# Patient Record
Sex: Female | Born: 1975
Health system: Southern US, Community
[De-identification: ages and names within clinical notes are randomized; demographics above are authoritative.]

## PROBLEM LIST (undated history)

## (undated) DIAGNOSIS — E785 Hyperlipidemia, unspecified: Secondary | ICD-10-CM

## (undated) DIAGNOSIS — M25472 Effusion, left ankle: Secondary | ICD-10-CM

## (undated) DIAGNOSIS — E282 Polycystic ovarian syndrome: Secondary | ICD-10-CM

## (undated) DIAGNOSIS — K219 Gastro-esophageal reflux disease without esophagitis: Secondary | ICD-10-CM

## (undated) DIAGNOSIS — K8050 Calculus of bile duct without cholangitis or cholecystitis without obstruction: Secondary | ICD-10-CM

## (undated) DIAGNOSIS — N390 Urinary tract infection, site not specified: Secondary | ICD-10-CM

## (undated) DIAGNOSIS — F32A Depression, unspecified: Secondary | ICD-10-CM

## (undated) DIAGNOSIS — M25475 Effusion, left foot: Secondary | ICD-10-CM

## (undated) DIAGNOSIS — M25471 Effusion, right ankle: Secondary | ICD-10-CM

## (undated) DIAGNOSIS — M25474 Effusion, right foot: Secondary | ICD-10-CM

## (undated) DIAGNOSIS — K829 Disease of gallbladder, unspecified: Secondary | ICD-10-CM

## (undated) DIAGNOSIS — E119 Type 2 diabetes mellitus without complications: Secondary | ICD-10-CM

## (undated) DIAGNOSIS — R5383 Other fatigue: Secondary | ICD-10-CM

## (undated) DIAGNOSIS — R0602 Shortness of breath: Secondary | ICD-10-CM

## (undated) DIAGNOSIS — F419 Anxiety disorder, unspecified: Secondary | ICD-10-CM

## (undated) DIAGNOSIS — I10 Essential (primary) hypertension: Secondary | ICD-10-CM

## (undated) DIAGNOSIS — K859 Acute pancreatitis without necrosis or infection, unspecified: Secondary | ICD-10-CM

## (undated) HISTORY — DX: Other fatigue: R53.83

## (undated) HISTORY — DX: Effusion, right ankle: M25.471

## (undated) HISTORY — DX: Effusion, right foot: M25.474

## (undated) HISTORY — PX: CHOLECYSTECTOMY: SHX55

## (undated) HISTORY — PX: OVARIAN CYST REMOVAL: SHX89

## (undated) HISTORY — DX: Depression, unspecified: F32.A

## (undated) HISTORY — DX: Urinary tract infection, site not specified: N39.0

## (undated) HISTORY — DX: Effusion, left foot: M25.475

## (undated) HISTORY — DX: Shortness of breath: R06.02

## (undated) HISTORY — DX: Polycystic ovarian syndrome: E28.2

## (undated) HISTORY — DX: Hyperlipidemia, unspecified: E78.5

## (undated) HISTORY — DX: Gastro-esophageal reflux disease without esophagitis: K21.9

## (undated) HISTORY — DX: Acute pancreatitis without necrosis or infection, unspecified: K85.90

## (undated) HISTORY — DX: Disease of gallbladder, unspecified: K82.9

## (undated) HISTORY — DX: Effusion, left ankle: M25.472

## (undated) HISTORY — DX: Calculus of bile duct without cholangitis or cholecystitis without obstruction: K80.50

## (undated) HISTORY — DX: Anxiety disorder, unspecified: F41.9

---

## 2004-01-13 ENCOUNTER — Other Ambulatory Visit: Admission: RE | Admit: 2004-01-13 | Discharge: 2004-01-13 | Payer: Self-pay | Admitting: Family Medicine

## 2005-01-29 ENCOUNTER — Other Ambulatory Visit: Admission: RE | Admit: 2005-01-29 | Discharge: 2005-01-29 | Payer: Self-pay | Admitting: Family Medicine

## 2008-11-03 ENCOUNTER — Emergency Department (HOSPITAL_COMMUNITY): Admission: EM | Admit: 2008-11-03 | Discharge: 2008-11-03 | Payer: Self-pay | Admitting: Emergency Medicine

## 2008-11-08 DIAGNOSIS — N809 Endometriosis, unspecified: Secondary | ICD-10-CM | POA: Insufficient documentation

## 2009-05-28 ENCOUNTER — Emergency Department (HOSPITAL_COMMUNITY): Admission: EM | Admit: 2009-05-28 | Discharge: 2009-05-28 | Payer: Self-pay | Admitting: Family Medicine

## 2009-05-30 ENCOUNTER — Inpatient Hospital Stay (HOSPITAL_COMMUNITY): Admission: EM | Admit: 2009-05-30 | Discharge: 2009-05-31 | Payer: Self-pay | Admitting: Emergency Medicine

## 2009-06-02 ENCOUNTER — Emergency Department (HOSPITAL_COMMUNITY): Admission: EM | Admit: 2009-06-02 | Discharge: 2009-06-02 | Payer: Self-pay | Admitting: Family Medicine

## 2009-06-05 ENCOUNTER — Inpatient Hospital Stay (HOSPITAL_COMMUNITY): Admission: EM | Admit: 2009-06-05 | Discharge: 2009-06-17 | Payer: Self-pay | Admitting: Emergency Medicine

## 2009-08-18 ENCOUNTER — Ambulatory Visit (HOSPITAL_COMMUNITY): Admission: RE | Admit: 2009-08-18 | Discharge: 2009-08-18 | Payer: Self-pay | Admitting: General Surgery

## 2009-08-18 ENCOUNTER — Encounter (INDEPENDENT_AMBULATORY_CARE_PROVIDER_SITE_OTHER): Payer: Self-pay | Admitting: General Surgery

## 2009-08-26 ENCOUNTER — Inpatient Hospital Stay (HOSPITAL_COMMUNITY): Admission: EM | Admit: 2009-08-26 | Discharge: 2009-08-30 | Payer: Self-pay | Admitting: Emergency Medicine

## 2009-09-09 ENCOUNTER — Emergency Department (HOSPITAL_COMMUNITY): Admission: EM | Admit: 2009-09-09 | Discharge: 2009-09-09 | Payer: Self-pay | Admitting: Emergency Medicine

## 2009-11-10 ENCOUNTER — Ambulatory Visit (HOSPITAL_COMMUNITY): Admission: RE | Admit: 2009-11-10 | Discharge: 2009-11-10 | Payer: Self-pay | Admitting: Gastroenterology

## 2011-04-03 LAB — CBC
Platelets: 360 10*3/uL (ref 150–400)
RDW: 14.7 % (ref 11.5–15.5)

## 2011-04-03 LAB — COMPREHENSIVE METABOLIC PANEL
ALT: 13 U/L (ref 0–35)
Albumin: 3.5 g/dL (ref 3.5–5.2)
Alkaline Phosphatase: 59 U/L (ref 39–117)
Calcium: 9 mg/dL (ref 8.4–10.5)
GFR calc Af Amer: >60 mL/min (ref >60)
Potassium: 4.4 mEq/L (ref 3.5–5.1)
Sodium: 138 mEq/L (ref 135–145)
Total Protein: 6.5 g/dL (ref 6.0–8.3)

## 2011-04-03 LAB — DIFFERENTIAL
Basophils Relative: 1 % (ref 0–1)
Eosinophils Absolute: 0.1 10*3/uL (ref 0.0–0.7)
Lymphs Abs: 1.9 10*3/uL (ref 0.7–4.0)
Monocytes Absolute: 0.3 10*3/uL (ref 0.1–1.0)
Monocytes Relative: 3 % (ref 3–12)

## 2011-04-03 LAB — TYPE AND SCREEN

## 2011-04-05 LAB — CBC
HCT: 32.4 % — ABNORMAL LOW (ref 36.0–46.0)
Hemoglobin: 10.7 g/dL — ABNORMAL LOW (ref 12.0–15.0)
MCHC: 33.1 g/dL (ref 30.0–36.0)
MCV: 81.6 fL (ref 78.0–100.0)
Platelets: 350 K/uL (ref 150–400)
RBC: 3.98 MIL/uL (ref 3.87–5.11)
RDW: 15.5 % (ref 11.5–15.5)
WBC: 9.1 K/uL (ref 4.0–10.5)

## 2011-04-05 LAB — LIPASE, BLOOD: Lipase: 19 U/L (ref 11–59)

## 2011-04-05 LAB — COMPREHENSIVE METABOLIC PANEL
ALT: 120 U/L — ABNORMAL HIGH (ref 0–35)
AST: 33 U/L (ref 0–37)
Alkaline Phosphatase: 84 U/L (ref 39–117)
CO2: 26 mEq/L (ref 19–32)
Calcium: 8.9 mg/dL (ref 8.4–10.5)
Chloride: 107 mEq/L (ref 96–112)
GFR calc Af Amer: >60 mL/min (ref >60)
GFR calc non Af Amer: >60 mL/min (ref >60)
Glucose, Bld: 116 mg/dL — ABNORMAL HIGH (ref 70–99)
Sodium: 139 mEq/L (ref 135–145)
Total Bilirubin: 0.5 mg/dL (ref 0.3–1.2)

## 2011-04-06 LAB — URINALYSIS, ROUTINE W REFLEX MICROSCOPIC
Bilirubin Urine: NEGATIVE
Glucose, UA: NEGATIVE mg/dL
Glucose, UA: NEGATIVE mg/dL
Hgb urine dipstick: NEGATIVE
Hgb urine dipstick: NEGATIVE
Nitrite: POSITIVE — AB
Protein, ur: NEGATIVE mg/dL
Specific Gravity, Urine: 1.038 — ABNORMAL HIGH (ref 1.005–1.030)
Urobilinogen, UA: 1 mg/dL (ref 0.0–1.0)
pH: 5.5 (ref 5.0–8.0)

## 2011-04-06 LAB — COMPREHENSIVE METABOLIC PANEL
ALT: 184 U/L — ABNORMAL HIGH (ref 0–35)
ALT: 30 U/L (ref 0–35)
ALT: 340 U/L — ABNORMAL HIGH (ref 0–35)
AST: 236 U/L — ABNORMAL HIGH (ref 0–37)
AST: 24 U/L (ref 0–37)
AST: 38 U/L — ABNORMAL HIGH (ref 0–37)
AST: 85 U/L — ABNORMAL HIGH (ref 0–37)
Albumin: 2.8 g/dL — ABNORMAL LOW (ref 3.5–5.2)
Albumin: 3.5 g/dL (ref 3.5–5.2)
Albumin: 3.7 g/dL (ref 3.5–5.2)
Alkaline Phosphatase: 121 U/L — ABNORMAL HIGH (ref 39–117)
Alkaline Phosphatase: 61 U/L (ref 39–117)
BUN: 4 mg/dL — ABNORMAL LOW (ref 6–23)
CO2: 21 mEq/L (ref 19–32)
Calcium: 8.4 mg/dL (ref 8.4–10.5)
Calcium: 9 mg/dL (ref 8.4–10.5)
Calcium: 9.2 mg/dL (ref 8.4–10.5)
Creatinine, Ser: 0.59 mg/dL (ref 0.4–1.2)
Creatinine, Ser: 0.63 mg/dL (ref 0.4–1.2)
GFR calc Af Amer: >60 mL/min (ref >60)
GFR calc Af Amer: >60 mL/min (ref >60)
GFR calc Af Amer: >60 mL/min (ref >60)
GFR calc Af Amer: >60 mL/min (ref >60)
Glucose, Bld: 97 mg/dL (ref 70–99)
Potassium: 3.8 mEq/L (ref 3.5–5.1)
Potassium: 4 mEq/L (ref 3.5–5.1)
Sodium: 131 mEq/L — ABNORMAL LOW (ref 135–145)
Sodium: 139 mEq/L (ref 135–145)
Sodium: 141 mEq/L (ref 135–145)
Total Protein: 5.5 g/dL — ABNORMAL LOW (ref 6.0–8.3)
Total Protein: 5.7 g/dL — ABNORMAL LOW (ref 6.0–8.3)
Total Protein: 6.6 g/dL (ref 6.0–8.3)

## 2011-04-06 LAB — DIFFERENTIAL
Basophils Relative: 0 % (ref 0–1)
Eosinophils Absolute: 0 10*3/uL (ref 0.0–0.7)
Eosinophils Absolute: 0.1 10*3/uL (ref 0.0–0.7)
Eosinophils Relative: 0 % (ref 0–5)
Lymphocytes Relative: 15 % (ref 12–46)
Lymphs Abs: 1.6 10*3/uL (ref 0.7–4.0)
Lymphs Abs: 2.3 10*3/uL (ref 0.7–4.0)
Monocytes Absolute: 0.5 10*3/uL (ref 0.1–1.0)
Monocytes Absolute: 0.5 10*3/uL (ref 0.1–1.0)
Monocytes Relative: 4 % (ref 3–12)
Monocytes Relative: 4 % (ref 3–12)
Neutrophils Relative %: 77 % (ref 43–77)

## 2011-04-06 LAB — CBC
HCT: 30.6 % — ABNORMAL LOW (ref 36.0–46.0)
Hemoglobin: 11.7 g/dL — ABNORMAL LOW (ref 12.0–15.0)
MCHC: 28 g/dL — ABNORMAL LOW (ref 30.0–36.0)
MCHC: 33.6 g/dL (ref 30.0–36.0)
MCV: 79.4 fL (ref 78.0–100.0)
Platelets: 319 10*3/uL (ref 150–400)
Platelets: 438 10*3/uL — ABNORMAL HIGH (ref 150–400)
Platelets: 448 10*3/uL — ABNORMAL HIGH (ref 150–400)
RBC: 4.67 MIL/uL (ref 3.87–5.11)
RDW: 15.2 % (ref 11.5–15.5)
RDW: 15.5 % (ref 11.5–15.5)
WBC: 10.8 10*3/uL — ABNORMAL HIGH (ref 4.0–10.5)

## 2011-04-06 LAB — URINE MICROSCOPIC-ADD ON

## 2011-04-06 LAB — PROTIME-INR
INR: 1.1 (ref 0.00–1.49)
Prothrombin Time: 13.9 seconds (ref 11.6–15.2)

## 2011-04-06 LAB — AMYLASE: Amylase: 228 U/L — ABNORMAL HIGH (ref 27–131)

## 2011-04-06 LAB — LIPASE, BLOOD: Lipase: >2000 U/L — ABNORMAL HIGH (ref 11–59)

## 2011-04-08 LAB — COMPREHENSIVE METABOLIC PANEL
ALT: 44 U/L — ABNORMAL HIGH (ref 0–35)
AST: 33 U/L (ref 0–37)
AST: 19 U/L (ref 0–37)
AST: 20 U/L (ref 0–37)
AST: 45 U/L — ABNORMAL HIGH (ref 0–37)
Albumin: 2.8 g/dL — ABNORMAL LOW (ref 3.5–5.2)
Albumin: 2 g/dL — ABNORMAL LOW (ref 3.5–5.2)
Albumin: 2 g/dL — ABNORMAL LOW (ref 3.5–5.2)
Albumin: 2 g/dL — ABNORMAL LOW (ref 3.5–5.2)
Albumin: 2.2 g/dL — ABNORMAL LOW (ref 3.5–5.2)
Albumin: 2.2 g/dL — ABNORMAL LOW (ref 3.5–5.2)
Albumin: 2.9 g/dL — ABNORMAL LOW (ref 3.5–5.2)
Alkaline Phosphatase: 145 U/L — ABNORMAL HIGH (ref 39–117)
Alkaline Phosphatase: 87 U/L (ref 39–117)
BUN: 12 mg/dL (ref 6–23)
BUN: 12 mg/dL (ref 6–23)
BUN: 15 mg/dL (ref 6–23)
BUN: 5 mg/dL — ABNORMAL LOW (ref 6–23)
BUN: 5 mg/dL — ABNORMAL LOW (ref 6–23)
BUN: 5 mg/dL — ABNORMAL LOW (ref 6–23)
BUN: 9 mg/dL (ref 6–23)
CO2: 27 mEq/L (ref 19–32)
CO2: 23 mEq/L (ref 19–32)
CO2: 26 mEq/L (ref 19–32)
Calcium: 9.1 mg/dL (ref 8.4–10.5)
Calcium: 9.3 mg/dL (ref 8.4–10.5)
Calcium: 8.1 mg/dL — ABNORMAL LOW (ref 8.4–10.5)
Calcium: 8.2 mg/dL — ABNORMAL LOW (ref 8.4–10.5)
Calcium: 8.9 mg/dL (ref 8.4–10.5)
Calcium: 9.3 mg/dL (ref 8.4–10.5)
Chloride: 106 mEq/L (ref 96–112)
Chloride: 102 mEq/L (ref 96–112)
Chloride: 104 mEq/L (ref 96–112)
Chloride: 106 mEq/L (ref 96–112)
Creatinine, Ser: 0.62 mg/dL (ref 0.4–1.2)
Creatinine, Ser: 0.66 mg/dL (ref 0.4–1.2)
Creatinine, Ser: 0.53 mg/dL (ref 0.4–1.2)
Creatinine, Ser: 0.59 mg/dL (ref 0.4–1.2)
Creatinine, Ser: 0.6 mg/dL (ref 0.4–1.2)
Creatinine, Ser: 0.75 mg/dL (ref 0.4–1.2)
Creatinine, Ser: 0.76 mg/dL (ref 0.4–1.2)
GFR calc Af Amer: >60 mL/min (ref >60)
GFR calc Af Amer: >60 mL/min (ref >60)
GFR calc Af Amer: >60 mL/min (ref >60)
GFR calc Af Amer: >60 mL/min (ref >60)
GFR calc Af Amer: >60 mL/min (ref >60)
GFR calc Af Amer: >60 mL/min (ref >60)
GFR calc non Af Amer: >60 mL/min (ref >60)
GFR calc non Af Amer: >60 mL/min (ref >60)
GFR calc non Af Amer: >60 mL/min (ref >60)
GFR calc non Af Amer: >60 mL/min (ref >60)
GFR calc non Af Amer: >60 mL/min (ref >60)
Glucose, Bld: 108 mg/dL — ABNORMAL HIGH (ref 70–99)
Glucose, Bld: 111 mg/dL — ABNORMAL HIGH (ref 70–99)
Glucose, Bld: 172 mg/dL — ABNORMAL HIGH (ref 70–99)
Potassium: 4 mEq/L (ref 3.5–5.1)
Potassium: 4.5 mEq/L (ref 3.5–5.1)
Sodium: 134 mEq/L — ABNORMAL LOW (ref 135–145)
Total Bilirubin: 0.5 mg/dL (ref 0.3–1.2)
Total Bilirubin: 0.4 mg/dL (ref 0.3–1.2)
Total Bilirubin: 0.6 mg/dL (ref 0.3–1.2)
Total Bilirubin: 0.7 mg/dL (ref 0.3–1.2)
Total Bilirubin: 1 mg/dL (ref 0.3–1.2)
Total Bilirubin: 2 mg/dL — ABNORMAL HIGH (ref 0.3–1.2)
Total Protein: 5.9 g/dL — ABNORMAL LOW (ref 6.0–8.3)
Total Protein: 5.4 g/dL — ABNORMAL LOW (ref 6.0–8.3)
Total Protein: 6.1 g/dL (ref 6.0–8.3)
Total Protein: 6.5 g/dL (ref 6.0–8.3)
Total Protein: 7.4 g/dL (ref 6.0–8.3)

## 2011-04-08 LAB — CULTURE, BLOOD (ROUTINE X 2)
Culture: NO GROWTH
Culture: NO GROWTH

## 2011-04-08 LAB — CBC
HCT: 33 % — ABNORMAL LOW (ref 36.0–46.0)
HCT: 28 % — ABNORMAL LOW (ref 36.0–46.0)
HCT: 28.7 % — ABNORMAL LOW (ref 36.0–46.0)
HCT: 30.9 % — ABNORMAL LOW (ref 36.0–46.0)
HCT: 33.4 % — ABNORMAL LOW (ref 36.0–46.0)
HCT: 34.4 % — ABNORMAL LOW (ref 36.0–46.0)
HCT: 38.9 % (ref 36.0–46.0)
HCT: 38.9 % (ref 36.0–46.0)
Hemoglobin: 10.9 g/dL — ABNORMAL LOW (ref 12.0–15.0)
Hemoglobin: 10.4 g/dL — ABNORMAL LOW (ref 12.0–15.0)
Hemoglobin: 10.9 g/dL — ABNORMAL LOW (ref 12.0–15.0)
Hemoglobin: 12.7 g/dL (ref 12.0–15.0)
Hemoglobin: 12.9 g/dL (ref 12.0–15.0)
Hemoglobin: 13.5 g/dL (ref 12.0–15.0)
MCHC: 33.1 g/dL (ref 30.0–36.0)
MCHC: 33.2 g/dL (ref 30.0–36.0)
MCHC: 33.6 g/dL (ref 30.0–36.0)
MCHC: 33.8 g/dL (ref 30.0–36.0)
MCHC: 33.8 g/dL (ref 30.0–36.0)
MCHC: 34 g/dL (ref 30.0–36.0)
MCV: 81.7 fL (ref 78.0–100.0)
MCV: 81.3 fL (ref 78.0–100.0)
MCV: 81.3 fL (ref 78.0–100.0)
MCV: 81.7 fL (ref 78.0–100.0)
MCV: 81.8 fL (ref 78.0–100.0)
MCV: 82.1 fL (ref 78.0–100.0)
MCV: 82.3 fL (ref 78.0–100.0)
MCV: 82.7 fL (ref 78.0–100.0)
Platelets: 313 10*3/uL (ref 150–400)
Platelets: 349 10*3/uL (ref 150–400)
Platelets: 360 10*3/uL (ref 150–400)
Platelets: 382 10*3/uL (ref 150–400)
Platelets: 556 10*3/uL — ABNORMAL HIGH (ref 150–400)
RBC: 4.05 MIL/uL (ref 3.87–5.11)
RBC: 3.73 MIL/uL — ABNORMAL LOW (ref 3.87–5.11)
RBC: 3.97 MIL/uL (ref 3.87–5.11)
RBC: 4.57 MIL/uL (ref 3.87–5.11)
RDW: 14.8 % (ref 11.5–15.5)
RDW: 15.3 % (ref 11.5–15.5)
RDW: 15.6 % — ABNORMAL HIGH (ref 11.5–15.5)
RDW: 16.2 % — ABNORMAL HIGH (ref 11.5–15.5)
WBC: 11.4 10*3/uL — ABNORMAL HIGH (ref 4.0–10.5)
WBC: 12.6 10*3/uL — ABNORMAL HIGH (ref 4.0–10.5)
WBC: 14.4 10*3/uL — ABNORMAL HIGH (ref 4.0–10.5)
WBC: 17.9 10*3/uL — ABNORMAL HIGH (ref 4.0–10.5)
WBC: 20.6 10*3/uL — ABNORMAL HIGH (ref 4.0–10.5)
WBC: 22.6 10*3/uL — ABNORMAL HIGH (ref 4.0–10.5)
WBC: 23.8 10*3/uL — ABNORMAL HIGH (ref 4.0–10.5)

## 2011-04-08 LAB — DIFFERENTIAL
Basophils Absolute: 0.2 10*3/uL — ABNORMAL HIGH (ref 0.0–0.1)
Basophils Relative: 0 % (ref 0–1)
Basophils Relative: 1 % (ref 0–1)
Basophils Relative: 1 % (ref 0–1)
Eosinophils Absolute: 0.4 10*3/uL (ref 0.0–0.7)
Eosinophils Absolute: 0 10*3/uL (ref 0.0–0.7)
Eosinophils Relative: 0 % (ref 0–5)
Lymphocytes Relative: 10 % — ABNORMAL LOW (ref 12–46)
Lymphs Abs: 1.8 10*3/uL (ref 0.7–4.0)
Lymphs Abs: 0.9 10*3/uL (ref 0.7–4.0)
Lymphs Abs: 1.5 10*3/uL (ref 0.7–4.0)
Lymphs Abs: 1.9 10*3/uL (ref 0.7–4.0)
Monocytes Absolute: 0.7 10*3/uL (ref 0.1–1.0)
Monocytes Absolute: 1 10*3/uL (ref 0.1–1.0)
Monocytes Relative: 4 % (ref 3–12)
Monocytes Relative: 5 % (ref 3–12)
Neutro Abs: 12.6 10*3/uL — ABNORMAL HIGH (ref 1.7–7.7)
Neutro Abs: 16.4 10*3/uL — ABNORMAL HIGH (ref 1.7–7.7)
Neutro Abs: 16.4 10*3/uL — ABNORMAL HIGH (ref 1.7–7.7)
Neutrophils Relative %: 73 % (ref 43–77)
Neutrophils Relative %: 87 % — ABNORMAL HIGH (ref 43–77)
Neutrophils Relative %: 89 % — ABNORMAL HIGH (ref 43–77)

## 2011-04-08 LAB — URINALYSIS, ROUTINE W REFLEX MICROSCOPIC
Hgb urine dipstick: NEGATIVE
Ketones, ur: >80 mg/dL — AB
Nitrite: POSITIVE — AB
Protein, ur: 100 mg/dL — AB
Specific Gravity, Urine: 1.025 (ref 1.005–1.030)
Urobilinogen, UA: 4 mg/dL — ABNORMAL HIGH (ref 0.0–1.0)
pH: 6 (ref 5.0–8.0)

## 2011-04-08 LAB — BASIC METABOLIC PANEL
BUN: 5 mg/dL — ABNORMAL LOW (ref 6–23)
CO2: 25 mEq/L (ref 19–32)
CO2: 26 mEq/L (ref 19–32)
CO2: 27 mEq/L (ref 19–32)
Calcium: 8.1 mg/dL — ABNORMAL LOW (ref 8.4–10.5)
Chloride: 104 mEq/L (ref 96–112)
Chloride: 102 mEq/L (ref 96–112)
Chloride: 103 mEq/L (ref 96–112)
Chloride: 104 mEq/L (ref 96–112)
Creatinine, Ser: 0.5 mg/dL (ref 0.4–1.2)
GFR calc Af Amer: >60 mL/min (ref >60)
GFR calc Af Amer: >60 mL/min (ref >60)
Glucose, Bld: 149 mg/dL — ABNORMAL HIGH (ref 70–99)
Potassium: 4 mEq/L (ref 3.5–5.1)
Potassium: 4.3 mEq/L (ref 3.5–5.1)
Potassium: 4.5 mEq/L (ref 3.5–5.1)
Sodium: 139 mEq/L (ref 135–145)
Sodium: 133 mEq/L — ABNORMAL LOW (ref 135–145)
Sodium: 138 mEq/L (ref 135–145)

## 2011-04-08 LAB — URINE CULTURE
Colony Count: NO GROWTH
Culture: NO GROWTH

## 2011-04-08 LAB — HEPATIC FUNCTION PANEL
ALT: 409 U/L — ABNORMAL HIGH (ref 0–35)
AST: 310 U/L — ABNORMAL HIGH (ref 0–37)
Albumin: 2.9 g/dL — ABNORMAL LOW (ref 3.5–5.2)
Alkaline Phosphatase: 146 U/L — ABNORMAL HIGH (ref 39–117)
Bilirubin, Direct: 1.4 mg/dL — ABNORMAL HIGH (ref 0.0–0.3)
Total Bilirubin: 2 mg/dL — ABNORMAL HIGH (ref 0.3–1.2)
Total Protein: 6.7 g/dL (ref 6.0–8.3)

## 2011-04-08 LAB — LIPASE, BLOOD
Lipase: 50 U/L (ref 11–59)
Lipase: 17 U/L (ref 11–59)
Lipase: 28 U/L (ref 11–59)

## 2011-04-08 LAB — AMYLASE
Amylase: 110 U/L (ref 27–131)
Amylase: 23 U/L — ABNORMAL LOW (ref 27–131)

## 2011-04-08 LAB — LIPID PANEL
Cholesterol: 169 mg/dL (ref 0–200)
LDL Cholesterol: 136 mg/dL — ABNORMAL HIGH (ref 0–99)
VLDL: 19 mg/dL (ref 0–40)

## 2011-04-08 LAB — URINE MICROSCOPIC-ADD ON

## 2011-04-08 LAB — PHOSPHORUS: Phosphorus: 2.7 mg/dL (ref 2.3–4.6)

## 2011-04-08 LAB — MAGNESIUM: Magnesium: 2.4 mg/dL (ref 1.5–2.5)

## 2011-04-08 LAB — TSH: TSH: 4.378 u[IU]/mL (ref 0.350–4.500)

## 2011-04-09 LAB — POCT URINALYSIS DIP (DEVICE)
Glucose, UA: NEGATIVE mg/dL
Nitrite: NEGATIVE
pH: 5.5 (ref 5.0–8.0)

## 2011-04-09 LAB — URINE CULTURE: Colony Count: >=100000

## 2011-05-14 NOTE — Discharge Summary (Signed)
Dawn Downs, Dawn Downs             ACCOUNT NO.:  [id number]   MEDICAL RECORD NO.:  [id number]          PATIENT TYPE:  INP   LOCATION:  1410                         FACILITY:  [facility]   PHYSICIAN:  Isidor Holts, M.D.  DATE OF BIRTH:  June 10, 1976   DATE OF ADMISSION:  06/05/2009  DATE OF DISCHARGE:  06/17/2009                               DISCHARGE SUMMARY   ADDENDUM:  For discharge diagnoses, refer to interim summary dictated  June 13, 2009 by Dr. Peggye Pitt.   UPDATED DISCHARGE DIAGNOSES:  1. Acute gallstone pancreatitis, resolved.  2. Cholelithiasis.  3. Morbid obesity.  4. Allergic rash.  5. Weber B-type fracture of distal left fibula.   DISCHARGE MEDICATIONS:  1. Protonix 40 mg p.o. daily.  2. Colace 100 mg p.o. b.i.d.  3. Pancrease 2 capsules p.o. t.i.d. with food.  4. Vicodin (5/325) one p.o. p.r.n. q.4 - 6 hourly. A total of 42 pills      have been dispensed.  5. 1% Hydrocortisone cream, applied topically to affected areas of      skin b.i.d. for 3 days, then stop.   Note:  Oral contraceptive medication, Ciprofloxacin and Phenergan, have  all been discontinued.   For details of admission history, clinical course and consultations,  refer to interim summary of June 13, 2009.  However, for the period from  June 14, 2009 to June 17, 2009, i.e., the date of this dictation, the  following are pertinent:  1. The patient's clinical condition improved steadily.  She no longer      had abdominal pain.  Following discussion with Gastroenterologist,      Dr. Randa Evens, [facility] tube feeding was discontinued on June 14, 2009,      and the patient was commenced on oral liquids, which were then      advanced without any deleterious consequences.  By June 16, 2009,      she was tolerating a regular low-fat diet without any      symptomatology whatsoever.  We were able to discontinue intravenous      fluids and lipase level checked on June 16, 2009, was 3.  The      patient  underwent abdominal CT scan on June 15, 2009, which showed      considerable imaging improvement, much less pleural fluid and basal      atelectasis, improving appearance of the pancreas with less      inflammation and peripancreatic edema, no developing pseudocyst was      evident.  She was been commenced on pancreatic enzyme supplements      on recommendations of gastroenterologist.  Surgeons were      reconsulted on June 16, 2009, to clarify timing of anticipated      laparoscopic cholecystectomy, and input was kindly provided by Dr.      Daphine Deutscher, who has recommended that patient follow up with Dr. Derrell Lolling      in the office in two week's time, as it would be useful to have      most of the retroperitoneal edema resolved prior to surgery.  Decision would be made following her office visit, as to exact      timing of cholecystectomy.  The patient and her family were      agreeable with this plan.   1. Left ankle fracture.  The patient has a recent left ankle fracture,      and was placed in a cast pre-admission, by Dr. Margaretha Sheffield from the      [facility].  Consultation was requested from      this group during the course of this hospitalization and was kindly      provided by Dr. Teryl Lucy, who ordered a left ankle x-ray,      which was done on June 14, 2009 and showed a healing response at      the Weber type B fracture of distal left fibula, with reduced      definition of fracture plane margins.  Dr. Dion Saucier has recommended      physiotherapy and toe touch weight bearing on the left side.  He      has advised the patient to follow up with him in the orthopedic      office, following discharge.   1. Allergic rash.  The patient on June 16, 2009, complained of a      generalized pruritic macular papular rash, which had arisen      overnight, with involvement of the torso and of the lower part of      her extremities.  Clinically, this was consistent with an  allergic      rash, and although the culprit was not immediately clear, it is      possible that this is maybe due to detergent in the bedclothes or      alternatively, her hospital gown.  She was treated with      antihistaminics, two doses of intravenous Solu-Medrol 12 hours      apart and then topical 1% Hydrocortisone cream, and by a.m. of June 17, 2009, the rash was practically resolved.  We have recommended      that she utilize topical treatment for a further 3 days, following      discharge.   DISPOSITION:  The patient was on June 17, 2009 asymptomatic, tolerating  a low-fat diet, devoid of any further acute issues.  She was considered  clinically stable for discharge, and was therefore discharged  accordingly.   DIET:  Low-fat.   ACTIVITY:  As tolerated, otherwise per PT/OT. Recommended to increase  activity slowly.   FOLLOWUP INSTRUCTIONS:  The patient is to follow up with Dr. Dion Saucier,  [facility], on a date to be determined.  She has  been instructed to call for an appointment, and has been supplied with  appropriate information.  In addition, she is to follow up with Dr.  Charlott Rakes, Gastroenterologist, in 1 week, the appropriate  information has been supplied, and she has been instructed to to call  for an appointment.  She is to follow up with Dr. Claud Kelp,  Surgeon, in 2 weeks. Once again, she has been instructed to call for an  appointment.      Isidor Holts, M.D.  Electronically Signed     CO/MEDQ  D:  06/17/2009  T:  06/17/2009  Job:  [id number]   cc:   Eulas Post, MD  Fax: [phone]   Shirley Friar, MD  Fax: [phone]   Angelia Mould. Derrell Lolling, M.D.  [phone]  [address]  [place]  [place] [address]

## 2011-05-14 NOTE — Op Note (Signed)
Dawn Downs, Dawn Downs             ACCOUNT NO.:  [id number]   MEDICAL RECORD NO.:  [id number]          PATIENT TYPE:  AMB   LOCATION:  DAY                          FACILITY:  [facility]   PHYSICIAN:  Angelia Mould. Derrell Lolling, M.D.DATE OF BIRTH:  Oct 12, 1976   DATE OF PROCEDURE:  08/18/2009  DATE OF DISCHARGE:                               OPERATIVE REPORT   PREOPERATIVE DIAGNOSES:  1. Chronic cholecystitis with cholelithiasis.  2. Biliary pancreatitis.   POSTOPERATIVE DIAGNOSES:  1. Chronic cholecystitis with cholelithiasis.  2. Biliary pancreatitis.   OPERATION PERFORMED:  Laparoscopic cholecystectomy with intraoperative  cholangiogram.   SURGEON:  Dr. Claud Kelp.   FIRST ASSISTANT:  Dr. Gaynelle Adu.   OPERATIVE INDICATIONS:  This is a 35 year old Caucasian female who was  admitted to the hospital on June 06, 2009 with gallstone pancreatitis.  CT findings suggested fairly severe edematous pancreatitis and a  calcific density in the head of pancreas.  She remains symptomatic for  several days and is felt to have moderately severe pancreatitis.  Subsequent CTs continued to show edematous pancreatitis, but no evidence  of necrosis or abscess.  On the second CT the calcific density in the  head of the pancreas was gone, and we theorized that she passed a common  duct stone.  We elected to delay her cholecystectomy because of the  severity of her pancreatitis.  She has been followed by me and by Dr.  Charlott Rakes.  She has been taking pancreatic enzyme supplements.  She is asymptomatic.  She is brought to operating room electively for  cholecystectomy.   OPERATIVE FINDINGS:  The gallbladder was thick-walled but small.  There  was no acute inflammation.  The cystic duct was thick-walled but patent.  The cholangiogram showed somewhat dilated biliary tree but there were no  filling defects, the anatomy of the intrahepatic and extrahepatic bile  ducts was normal, and there was no  obstruction with good flow of  contrast into the duodenum.  The liver looked somewhat enlarged and  heavy consistent with mild fatty infiltration.  The stomach, duodenum,  small intestine, large intestine and omentum and peritoneal surfaces  otherwise looked normal.   OPERATIVE TECHNIQUE:  Following induction of general endotracheal  anesthesia, the patient's abdomen was prepped and draped in sterile  fashion.  Intravenous antibiotics were given.  The patient was  identified as correct patient and correct procedure.  0.5% Marcaine with  epinephrine was used as a local infiltration anesthetic.  A vertically  oriented incision was made in the upper rim of the umbilicus.  The  fascia was incised in the midline.  The abdominal cavity entered under  direct vision.  A 10-mm Hasson trocar was inserted and secured with a  pursestring suture of 0-0 Vicryl.  Pneumoperitoneum was created.  Video  cam was inserted with visualization and findings as described above.  A  11-mm trocar was placed in the subxiphoid region and two 5 mm trocars  placed in the right upper quadrant.  The gallbladder fundus was  identified and elevated.  A small tear occurred early spilling some  bile.  A  couple of stones came out of the fundus of the gallbladder and  these were quickly retrieved.  We took adhesions down off the lower body  and infundibulum of the gallbladder.  We could identify the cystic duct,  cystic artery and common bile duct.  We carefully divided the peritoneum  over the neck of the gallbladder.  We isolated the cystic duct and  cystic artery.  Cystic artery was secured with metal clips and divided.  This created a nice window behind the gallbladder and cystic duct.  A  cholangiogram catheter was inserted into the cystic duct and a  cholangiogram was obtained using the C-arm.  The cholangiogram was  normal as described above.  The cholangiogram catheter was removed, the  cystic duct secured with  multiple metal clips and divided.  The field  was irrigated and looked clean.  We then dissected the gallbladder from  its bed with electrocautery, placed it in a specimen bag and removed it.  The bed of the gallbladder was inspected.  Hemostasis was excellent.  There was no bleeding and no bile leak whatsoever.  We irrigated the  subphrenic and subphrenic spaces and all the irrigation fluid was  removed and was clear.  The trocars were removed under direct vision.  There was no bleeding from the trocar sites.  Pneumoperitoneum was  released.  The fascia at the umbilicus was closed with 0-0 Vicryl  sutures.  After irrigating the wounds, we closed all the skin incisions  with subcuticular sutures of 4-0 Monocryl and Steri-Strips.  Clean  bandages were placed and the patient taken to the recovery room in  stable condition.  Estimated blood loss was about 10 mL.  Complications  none.  Sponge, needle and instrument counts were correct.      Angelia Mould. Derrell Lolling, M.D.  Electronically Signed     HMI/MEDQ  D:  08/18/2009  T:  08/18/2009  Job:  [id number]   cc:   Shirley Friar, MD  Fax: [phone]   Della Goo, M.D.  Fax: [phone]

## 2011-05-14 NOTE — H&P (Signed)
Dawn Downs, Dawn Downs             ACCOUNT NO.:  [id number]   MEDICAL RECORD NO.:  [id number]          PATIENT TYPE:  INP   LOCATION:  1410                         [facility]ACILITY:  [facility]   PHYSICIAN:  Della Goo, M.D. DATE OF BIRTH:  11/13/1976   DATE OF ADMISSION:  06/05/2009  DATE OF DISCHARGE:                              HISTORY & PHYSICAL   PRIMARY CARE PHYSICIAN:  Unassigned.   CHIEF COMPLAINT:  Abdominal pain.   HISTORY OF PRESENT ILLNESS:  This is a 35 year old female who presents  to the emergency department with complaints of worsening abdominal pain,  nausea and vomiting.  The patient states that she has been sick for a  week with these symptoms.  She was seen one week ago in an Urgent Care  Center and evaluated and given treatment for a urinary tract infection.  The patient was told if her symptoms worsen to follow-up with a  urologist, and she saw urologist on Tuesday and had a CT scan performed  by Urology.  The findings of a CT scan revealed gallstones and  pancreatitis.  The patient went to the emergency department and was  admitted that Tuesday night.  She stated that she was hospitalized and  remained in the hospital one day and was discharged.  She states that  her symptoms continue, however, and she returns today secondary to  worsening.  She reports having nausea and vomiting and poor appetite.  She denies having any diarrhea, and also reports having low grade  fevers.   PAST MEDICAL HISTORY:  1. Polycystic ovarian disease.  2. Kidney stone.  3. Obesity.  4. Recent left ankle fracture.   SOCIAL HISTORY:  The patient is a nonsmoker.  She reports drinking  socially and rarely.   FAMILY HISTORY:  Positive for coronary artery disease in both maternal  and paternal grandparents.  Positive for hypertension in her maternal  family.  Positive for diabetes in her father and positive for cancer.  Her maternal grandmother had lung cancer and was a smoker.   REVIEW OF SYSTEMS:  Pertinents are mentioned above.  All other organ  systems are negative.   PHYSICAL EXAMINATION FINDINGS:  GENERAL:  This is a morbidly obese 12-  year-old female in discomfort but no acute distress.  VITAL SIGNS:  Temperature 97.3, blood pressure 131/87, heart rate 76,  respirations 24, O2 saturations 100%.  HEENT:  Normocephalic, atraumatic.  Pupils equally round, reactive to  light.  Extraocular movements are intact.  Funduscopic benign.  There is  no scleral icterus.  Nares are patent bilaterally.  Oropharynx is clear.  NECK:  Supple, full range of motion.  No thyromegaly, adenopathy,  jugulovenous distention.  CARDIOVASCULAR:  Regular rate and rhythm.  No murmurs, gallops or rubs.  LUNGS:  Clear to auscultation bilaterally.  ABDOMEN:  Positive for bowel sounds.  Soft, mild tender in the  epigastrium.  There is no rebound or guarding.  No hepatosplenomegaly.  EXTREMITIES:  Without cyanosis, clubbing or edema.  The left lower  extremity is in a cast secondary to the ankle fracture.  NEUROLOGIC:  The patient is alert  and oriented x3.  There are no focal deficits on  examination.   LABORATORY STUDIES:  White blood cell count 14.4, hemoglobin 13.5,  hematocrit 38.9 and platelets 518, MCV 80.8.  Sodium 140, potassium 4.0,  chloride 104, carbon dioxide 25, BUN 12, creatinine 0.64 and glucose  172, albumin 2.9, AST 282, lipase level 1705.   ASSESSMENT:  A 35 year old female being admitted with:  1. Abdominal pain.  2. Acute pancreatitis.  3. Partially treated urinary tract infection.  4. Left ankle fracture prior to hospitalization.  5. Morbid obesity.   PLAN:  The patient will be admitted, placed on bowel rest, IV fluids  have been ordered for fluid rehydration therapy.  Antiemetics and pain  control therapy have also been ordered.  The patient's liver function  tests and lipase levels will be monitored.  An ultrasound study will be  considered as well.  A GI  consultation will be requested and the patient  will be placed on DVT and GI prophylaxis      Della Goo, M.D.  Electronically Signed     HJ/MEDQ  D:  06/06/2009  T:  06/06/2009  Job:  [id number]

## 2011-05-14 NOTE — H&P (Signed)
Dawn Downs, Dawn Downs             ACCOUNT NO.:  [id number]   MEDICAL RECORD NO.:  [id number]          PATIENT TYPE:  EMS   LOCATION:  ED                           FACILITY:  [facility]   PHYSICIAN:  Manus Gunning, MD      DATE OF BIRTH:  1976/08/05   DATE OF ADMISSION:  05/30/2009  DATE OF DISCHARGE:                              HISTORY & PHYSICAL   CHIEF COMPLAINT:  Right flank pain.  \   HISTORY OF PRESENT ILLNESS:  Dawn Downs is a pleasant 35 year old  Caucasian female who complains of nausea, vomiting, and right flank pain  since this past Thursday (five days' duration).  She describes the pain  as sudden onset, no aggravating or relieving factors, worse with  movement and twisting from side to side.  She initially felt that this  was a muscle spasm.  She has been packing up office and shifting homes,  but as this persisted she became more concerned.  She also has  experienced nausea and vomiting and has decreased appetite secondary to  that and subsequently went to an urgent care this past Sunday.  At that  time she was diagnosed with a urinary tract infection and had been  prescribed Macrodantin 100 mg twice daily with Vicodin 5/500 every eight  hours as needed and Phenergan 25 mg q.6 h. p.r.n. as needed.  Despite  taking medications she continued to experience pain, nausea, vomiting,  and the flank pain, and therefore presented to the emergency department  today.  At the time of presentation her temperature was 100 degrees  Fahrenheit, had a white count of 22,600 with 89% polymorphs.  The  patient was in obvious discomfort during my interview and had CV angle  tenderness during examination.  She denies any dysuria, denies any  radiation from the site.  She claims that movement makes it worse,  especially twisting her torso from side to side.  There is no  respiratory component.  Earlier today she had been referred to the  neurologist by the urgent care and she visited and a CT  scan was  performed.  Though no formal report is up according to the patient as  well as to ED sign out, the report was essentially negative for  nephrolithiasis, for cholecystitis, and for pancreatitis.  A repeat  ultrasound of bilateral kidneys in the emergency department has  demonstrated bilateral renal symmetry as well as no signs of  cholecystitis, though there are gallstones present with no ductal  dilatation.  In regards to further workup in the emergency department,  her AST is 48, mildly elevated, with an ALT elevation of 215.  Total  bilirubin, alkaline phosphatase, and protein levels remain within normal  limits though her albumin is mildly decreased at 3.3.   The patient denies headache, denies syncope, no presyncope, no loss of  consciousness, no falls, no trauma.  She does describe muscle spasms of  the back from shifting and lifting heavy boxes.  Denies chest pain,  palpitations, PND, or orthopnea.  No shortness of breath, cough,  expectoration, dyspnea on exertion.  No rhinorrhea, no  fever at home.  Positive right flank pain.  No overt abdominal pain.  Positive nausea,  vomiting.  No diarrhea, constipation, dysuria, polyuria, hematuria.  No  bright red blood per rectum or melanotic stools.  No overt  musculoskeletal complaints of this since we have described those above.   PAST MEDICAL/SURGICAL HISTORY:  1. Obesity.  2. Ovarian cyst, surgical removal.   ALLERGIES:  No known drug allergies.   SOCIAL HISTORY:  Denies tobacco.  Social alcohol drinker.  No history of  illicit drug use.  Lives independently at home.   FAMILY HISTORY:  Father has a history of diabetes mellitus.  Mother is  healthy.  Apparently there is heart disease in the paternal grandparents  in their 23s.   HOME MEDICATIONS:  1. The patient is on oral contraceptive pills, Low-Ogestrel on      apparently a 28-day plan, take as prescribed.  2. Vicodin 5/500 every eight hours as needed.  3. Macrobid  100 mg twice a day.  4. Phenergan 25 mg every 4-6 hours as needed.   REVIEW OF SYSTEMS:  A 14-point review of systems was performed.  Pertinent positives and negatives as described above.   PHYSICAL EXAMINATION:  VITAL SIGNS:  At time of presentation:  Temperature 100 degrees Fahrenheit, heart rate 134, respiratory rate 20,  blood pressure 127/87, O2 saturation 98% on room air.  GENERAL:  Obese young lady lying in bed comfortably, no apparent  distress.  HEENT:  Normocephalic, atraumatic.  Dry oral mucosa.  No thrush,  erythema, or post nasal drip.  Eyes anicteric.  Extraocular muscles are  intact.  Pupils are equal and react to light and accommodation.  NECK:  Supple.  Good range of motion.  No thyromegaly.  Flat neck veins.  CARDIOVASCULAR:  S1/S2 normal.  Regular rate and rhythm.  No murmurs,  rubs, or gallops.  RESPIRATORY:  Air entry is bilaterally equal.  No rales, rhonchi, or  wheezes appreciated.  ABDOMEN:  Soft, nontender, nondistended.  Positive bowel sounds.  No  organomegaly.  Positive right-sided CV angle tenderness.  EXTREMITIES:  No cyanosis, clubbing, or edema.  Positive bilateral  dorsalis pedis.  CENTRAL NERVOUS SYSTEM:  Alert, oriented x3.  Power, sensation, reflexes  bilaterally symmetrical.  SKIN:  No breakdown, swelling, ulcerations, or masses.  HEMATOLOGY/ONCOLOGY:  No palpable lymphadenopathy, ecchymosis, bruising,  or petechiae.  NECK:  Supple.  Good range of motion.  No thyromegaly.  No carotid  bruits.  Neck veins appear to be flat.   LABORATORY DATA:  White blood cell count 22,600, hemoglobin 12.9,  hematocrit 38.9, platelets 377, polymorphs 89.  Sodium is 136, chloride  is 101, potassium 3.5, CO2 23, glucose 111, BUN 12, creatinine 0.79.  Total bilirubin is 1.2, alkaline phosphatase 109, AST 48, ALT 215, total  protein 7.4, albumin 3.3, calcium 9.3.  Lipase is 28.  UA demonstrates  small bilirubin, greater than 80 ketones, blood is negative.   Leukocytes  are small, nitrite positive.  Bacteria many.  Mucus is present.  Squamous cells are rare.  Hyaline casts are negative.   ASSESSMENT/PLAN:  1. Pyelonephritis.  At this time I believe to be uncomplicated.  Start      IV levofloxacin 750 mg daily and switch to p.o. once the patient's      nausea is resolved.  Obtain a urine culture/sensitivity.  Also      check blood culture sensitivity as well.  Start normal saline at      125 ml  an hour and continue.  Pain control with morphine 2 mg IV      q.4 h. p.r.n. and continue Vicodin as well.  2. For nausea, hold Phenergan and start Zofran 4 mg p.o. daily.  At      this time I will admit the patient as an observation admit.  She is      to be placed on heparin 5000 units subcu q.8 h. and Protonix 40 mg      p.o. daily.     Manus Gunning, MD  Electronically Signed    SP/MEDQ  D:  05/30/2009  T:  05/31/2009  Job:  [id number]

## 2011-05-14 NOTE — Consult Note (Signed)
Dawn Downs, Dawn Downs             ACCOUNT NO.:  [id number]   MEDICAL RECORD NO.:  [id number]          PATIENT TYPE:  INP   LOCATION:  1410                         FACILITY:  [facility]   PHYSICIAN:  Angelia Mould. Derrell Lolling, M.D.DATE OF BIRTH:  1976-06-16   DATE OF CONSULTATION:  06/06/2009  DATE OF DISCHARGE:                                 CONSULTATION   REFERRING PHYSICIAN:  Dr. Charlott Rakes   REASON FOR EVALUATION:  Consultation regarding gallstone pancreatitis.   HISTORY OF PRESENT ILLNESS:  This is a 35 year old Caucasian female who  has not had any biliary tract problems in the past.  Approximately 1  week ago, she had some lower back pain and noticed that her urine was  dark orange but did not have any fever or chills.  She says she was seen  at urgent care and treated for UTI.  She subsequently was followed up at  [facility] where she reports a CT scan showed gallstones and she  had pancreatitis.  She was sent to the emergency room and was admitted  for 24 hours by one of the hospitalists' service.  That record reflects  treatment for pyelonephritis.   The patient states that she was better and went home.  She has been  moving from 1 house to another, fell and fractured her left ankle, saw  one of the orthopedic surgeons at [facility], and is in a splint or  cast at this time.   Yesterday, June 05, 2009, she developed back pain and abdominal pain,  nausea and vomiting.  She came to the emergency room where a CT scan  shows gallstones, moderately severe edematous pancreatitis without any  evidence of necrosis or abscess, and suspicion of a calcific density in  the head of the pancreas consistent with a common bile duct stone.  She  was admitted by Dr. Della Goo of the [facility].  Dr. Charlott Rakes was asked to see her this morning, and he did see  her today.  It was Dr. Bosie Clos who asked me to see her to evaluate from  a surgical perspective.   Dr. Bosie Clos states that he is considering ERCP if the liver function  tests do not go down to resolve the common duct stone issue.   Today, the patient states that her nausea has resolved, but she still  has upper abdominal pain.   PAST MEDICAL HISTORY:  1. Morbid obesity.  2. Kidney stones.  3. Recent urinary tract infection.  4. Recent left ankle fracture.  5. Polycystic ovary disease.  6. Mild elevation of cholesterol but normal triglycerides.   CURRENT MEDICATIONS:  Lovenox, Dilaudid, Protonix, Tylenol, Zofran.   DRUG ALLERGIES:  None known.   SOCIAL HISTORY:  She is single.  She has no children.  She denies  tobacco.  Drinks alcohol rarely.  Works for [employer] and is  Geophysical data processor for [employer].   FAMILY HISTORY:  Positive for coronary artery disease in both maternal  and paternal grandparents, positive for hypertension in maternal family,  positive for diabetes in the father,  positive cancer in the maternal  grandmother who had lung cancer and was a smoker.   REVIEW OF SYSTEMS:  A 10-system review of systems is performed and is  noncontributory except as described above.   PHYSICAL EXAMINATION:  GENERAL:  Pleasant, alert, morbidly obese  Caucasian female in mild distress.  LAST VITAL SIGNS:  Reveal temperature of 99, heart rate of 116,  respiratory rate 20, blood pressure 112/72, oxygen saturation 92%.  HEENT:  Eyes:  Sclerae are clear.  Extraocular movements are intact.  Ears, nose, mouth and throat, lips, tongue and oropharynx are without  gross lesions.  NECK:  Supple, nontender.  No mass.  No jugular venous distention.  LUNGS:  Clear to auscultation.  No rales, no rhonchi, no wheezing.  HEART:  Regular rate and rhythm, slightly tachycardic.  No ectopy.  No  murmur.  ABDOMEN:  Obese.  She is tender with some guarding across the entire  upper abdomen, right upper quadrant, epigastrium and left upper  quadrant.  Lower abdomen is  softer.  Minimal bowel sounds, but there are  some bowel sounds.  She is not obviously distended.  There is no mass.  EXTREMITIES:  She moves all 4 extremities well without pain or deformity  except for the left ankle which is in a cast. NEUROLOGIC:  No gross  motor or sensory deficits.   LABORATORY WORK:  Lipase is 340 today; yesterday, it was 1705.  Hepatic  function panel today reveals a bilirubin of 2, alkaline phosphatase of  146, SGOT of 310 and SGPT of 409, albumin of 2.5.  CBC today reveals a  white blood cell count of 17,900, hemoglobin of 12.5 and a left shift.   CT scan:  As described above.   ASSESSMENT:  1. Gallstone pancreatitis.  Considering the amount of inflammatory      edema on the CT, the leukocytosis, and her tachycardia, I think      this is a moderately severe episode of pancreatitis and will      probably be somewhat slow to resolve.  2. Possible retained common bile duct stone, intrapancreatic seen on      CT.  3. Recent treatment for urinary tract infection.  4. Morbid obesity.  5. Polycystic ovary disease.  6. Recent left ankle fracture.  7. Borderline hyperlipidemia.   PLAN:  1. I agree with admission to the hospital, DVT prophylaxis and proton      pump inhibitors and bowel rest as you are doing.  2. A consideration needs to be given to ERCP tomorrow or the next day      if her liver function tests remain elevated.  3. If she continues to have pancreatitis more than a few days, we need      to consider hyperalimentation.  4. I would repeat her CT scan in 5-6 days to make sure that she is not      developing any evidence of any      pseudocyst, abscess or necrosis.  5. Ultimately, she will need a cholecystectomy after her pancreatitis      resolves.  This may take some time before this is done.      Angelia Mould. Derrell Lolling, M.D.  Electronically Signed     HMI/MEDQ  D:  06/06/2009  T:  06/06/2009  Job:  [id number]   cc:   Shirley Friar, MD   Fax: [phone]   Della Goo, M.D.  Fax: [phone]   [facility]  [facility] 

## 2011-05-14 NOTE — H&P (Signed)
NAMENORLENE, LANES NO.:  [id number]   MEDICAL RECORD NO.:  [id number]          PATIENT TYPE:  EMS   LOCATION:  ED                           FACILITY:  [facility]   PHYSICIAN:  Velora Heckler, MD      DATE OF BIRTH:  05/04/1976   DATE OF ADMISSION:  08/26/2009  DATE OF DISCHARGE:                              HISTORY & PHYSICAL   REFERRING PHYSICIAN:  [facility] department.   CHIEF COMPLAINT:  Abdominal and back pain, nausea and vomiting.   HISTORY OF PRESENT ILLNESS:  Dawn Downs is a 35 year old white  female from [place], [place].  She is 1 week status post  laparoscopic cholecystectomy with intraoperative cholangiography by Dr.  Claud Kelp.  She has been followed by Dr. Bernette Redbird and his  associates at [facility].  The patient developed mild  abdominal pain on Wednesday August 25.  The pain became more severe on  Friday August 27 and radiated to the back.  It was associated with  nausea and vomiting.  The patient presented to the emergency room.  On  evaluation, she was noted to have elevated liver function test and a  markedly elevated lipase level.  CT scan abdomen and pelvis was obtained  which showed findings consistent with acute pancreatitis.  Gastroenterology was contacted and the patient was referred to general  surgery for management.   PAST MEDICAL HISTORY:  1. History of nephrolithiasis.  2. History of polycystic ovarian syndrome.  3. History of morbid obesity.  4. Status post laparoscopic cholecystectomy for cholelithiasis.  5. History of pancreatitis.   MEDICATIONS:  Metformin, Creon, Protonix.   ALLERGIES:  None known.   SOCIAL HISTORY:  The patient is single.  She is accompanied by her  father.  She works for [employer].  She denies tobacco use.  She  denies alcohol use.   FAMILY HISTORY:  Notable for coronary artery disease, hypertension and  diabetes.   A 15-system review  documented in the medical record without significant  other findings except as noted above.   PHYSICAL EXAMINATION:  GENERAL:  A 35 year old obese white female on a  stretcher in the emergency department in no acute distress.  VITAL SIGNS:  Temperature 98.2, pulse 72, respirations 18, blood  pressure 128/73.  HEENT: Shows her to be normocephalic, atraumatic.  Sclerae clear.  Conjunctivae clear.  Pupils equal and reactive.  Dentition good.  Mucous  membranes moist.  Voice normal.  Palpation of the neck shows no mass.  No tenderness.  LUNGS:  Clear to auscultation bilaterally.  CARDIAC:  Exam shows regular rate and rhythm without murmur.  Peripheral  pulses are full.  EXTREMITIES:  Nontender without edema.  NEUROLOGICAL:  The patient is alert and oriented without focal deficit.  There is no sign of tremor.  ABDOMEN:  Soft, obese.  There are a few bowel sounds on auscultation.  Surgical incisions consistent with laparoscopic cholecystectomy are dry  and intact with Steri-Strips in place.  There is mild to moderate  tenderness across the epigastrium and mid abdomen.  There is no  guarding.  There is no palpable mass.   LABORATORY STUDIES:  White count 10.8, hemoglobin 12.4, platelet count  438,000, differential 80% segmented neutrophils.  Chemistry profile is  notable for a low sodium of 131 and an elevated glucose of 162.  Liver  function tests show total bilirubin 2.4, alkaline phosphatase 225, SGOT  236, SGPT 340, lipase greater than 2000.   IMPRESSION:  1. Acute pancreatitis.  2. Morbid obesity.  3. Recent cholecystectomy.  4. Polycystic ovarian syndrome.  5. History of nephrolithiasis.   PLAN:  The patient is admitted on the general surgical service.  She  will receive intravenous hydration and be maintained n.p.o.  We will  treat her with analgesics for pain and treat nausea as needed.  Gastroenterology will be consulted regarding possible need for ERCP for  possible  retained common bile duct stone as an etiology for her  pancreatitis.      Velora Heckler, MD  Electronically Signed     TMG/MEDQ  D:  08/26/2009  T:  08/26/2009  Job:  [id number]   cc:   Angelia Mould. Derrell Lolling, M.D.  [address]  [place]  [place] [address]   Bernette Redbird, M.D.  Fax: [phone]

## 2011-05-14 NOTE — Consult Note (Signed)
Dawn, Downs             ACCOUNT NO.:  [id number]   MEDICAL RECORD NO.:  [id number]          PATIENT TYPE:  INP   LOCATION:  1339                         FACILITY:  [facility]   PHYSICIAN:  Petra Kuba, M.D.    DATE OF BIRTH:  [birth year]   DATE OF CONSULTATION:  08/26/2009  DATE OF DISCHARGE:                                 CONSULTATION   HISTORY:  The patient is known to me from previous hospitalization with  presumed passed CBD stone.  She initially had a CAT scan which implied a  stone but the calcification had resolved on a followup CT and her liver  test had normalized.  Recently she had her gallbladder out with a  negative intraop cholangiogram and she has been doing well in the postop  course when her customary pain of pancreatitis recurred which was in the  midepigastric area radiating to her back.  She presented to the  emergency room and again had increased lipase to 2000 and elevated liver  tests.  She is doing better than she was before and other than some very  mild right upper quadrant pain had not had much GI problem since her  procedure.  She has not had any diarrhea and has not had any heartburn,  indigestion or reflux and no fever, chills or night sweats.   PAST MEDICAL HISTORY:  Is pertinent for the recent laparoscopic  cholecystectomy and bout of pancreatitis as well as polycystic ovarian  disease, history of kidney stones and a recent left ankle fracture.   SOCIAL HISTORY:  The patient does not smoke and drinks socially only.   FAMILY HISTORY:  Negative for any obvious GI problems.   CURRENT MEDICATIONS:  Include her Creon, metformin and Protonix.   ALLERGIES:  Are none.   REVIEW OF SYSTEMS:  Negative except as above.   PHYSICAL EXAM:  VITAL SIGNS:  Stable, afebrile.  GENERAL:  No acute distress.  LUNGS:  Clear.  HEART:  Regular rate and rhythm.  ABDOMEN:  Abdomen is soft, very minimal midepigastric discomfort without  guarding or rebound.   Positive bowel sounds.   Lipase greater than 2000.  Normal BUN and creatinine.  Bili of 2.4 with  an alk phos of 225, SGOT 236, SGPT 340.  CBC normal, MCV 79, hemoglobin  12.4, white count 10.8, slight left shift with segs of 80, platelets  normal 438.   ASSESSMENT:  Probable recurring common bile duct stones in a patient  with recurrent pancreatitis.   PLAN:  Agree with the usual pancreatitis treatment.  Will go ahead and  resume her pancreatic enzymes.  Pending clinical course could consider  an MRCP or endoscopic ultrasound to rule out CBD stones but even if they  were both  negative I believe she would benefit from an ERCP / sphincterotomy in  case of stones in the future or if this is biliary dyskinesia cutting  the duct would be helpful.  The ideal time would be as an outpatient in  1-2 weeks after discharge.  Will follow with you.  I have discussed  everything with the patient  and her father.           ______________________________  Petra Kuba, M.D.     MEM/MEDQ  D:  08/26/2009  T:  08/26/2009  Job:  [id number]   cc:   Angelia Mould. Derrell Lolling, M.D.  [address], [address]  [place]  [place] [address]   Della Goo, M.D.  Fax: [phone]

## 2011-05-14 NOTE — Discharge Summary (Signed)
Dawn Downs, Dawn Downs             ACCOUNT NO.:  [id number]   MEDICAL RECORD NO.:  [id number]          PATIENT TYPE:  INP   LOCATION:  1307                         [facility]ACILITY:  [facility]   PHYSICIAN:  Hillery Aldo, M.D.   DATE OF BIRTH:  [birth year]   DATE OF ADMISSION:  05/30/2009  DATE OF DISCHARGE:  05/31/2009                               DISCHARGE SUMMARY   PRIMARY CARE PHYSICIAN:  None.  The patient previously saw Dr. Gar Gibbon Mazzocchi, who is no longer practicing.   DISCHARGE DIAGNOSES:  1. Acute pyelonephritis.  2. Fatty liver disease.  3. Elevated ALT.  4. Hypokalemia.  5. Dyslipidemia.  6. Nausea and vomiting secondary to pyelonephritis.  7. Cholelithiasis.   DISCHARGE MEDICATIONS:  1. Cipro 500 mg p.o. b.i.d. times 14 days.  2. Phenergan 25 mg p.o. q.6 h p.r.n. nausea, vomiting.  3. Low-Ogestrel one tablet daily as prescribed.  4. Vicodin 5/500 one tablet q.8 h p.r.n. pain.   CONSULTATIONS:  None.   BRIEF ADMISSION HPI:  The patient is a 35 year old female who presented  to the hospital with fever, costovertebral angle tenderness on the  right, nausea, and vomiting.  The patient failed an outpatient course of  therapy with Macrobid.  For the full details, please see the dictated  report done by Dr. Allena Downs.   PROCEDURES AND DIAGNOSTIC STUDIES:  Abdominal ultrasound on May 30, 2009  showed cholelithiasis with shadowing calculi present in a relatively  contracted gallbladder.  No evidence of common bile duct dilatation.  The liver showed diffuse fatty infiltration.   DISCHARGE LABORATORY VALUES:  Sodium was 135, potassium 3.2 (repleted  with 40 mEq of potassium p.o. prior to discharge), chloride 102, bicarb  25, BUN 15, creatinine 0.75, glucose 125, calcium 8.4, total bilirubin  1.0, alkaline phosphatase 93, AST 35, ALT 169, total protein 6.5,  albumin 2.9.  White blood cell count was 19.5, hemoglobin 11.5,  hematocrit 34.4, platelets 313.  Total cholesterol was  169, HDL 14,  triglycerides 94, LDL 136.   HOSPITAL COURSE BY PROBLEM:  1. Acute pyelonephritis.  The patient was admitted and put on IV      Levaquin.  Cultures were sent but are not back at the time of this      dictation.  The patient feels well and is requesting discharge.      She has given me her contact information and I will contact her if      the antibiotic we discharge her on is not effective against the      infecting organism.  The patient appears well clinically.  She is      no longer complaining of nausea or vomiting and therefore I will      discharge her home on an outpatient course of Cipro for 14 days of      treatment total.  2. Elevated ALT/fatty liver:  The patient does have an elevated ALT      but no evidence of elevated alkaline phosphatase or signs of      biliary obstruction.  She does have cholelithiasis noted on  abdominal ultrasonography.  The patient has been counseled      regarding the importance of weight-loss and low-fat diet for      prevention of progression of this disease.  3. Hypokalemia:  The patient was repleted with 40 mEq p.o. potassium      prior to discharge.  This is likely due to underlying GI losses      from her recent bout of nausea and vomiting.  4. Dyslipidemia:  The patient has been counseled regarding the      importance of a low-fat diet.  She is instructed to follow a low-      fat diet for 6 months and then to have her cholesterol panel      rechecked by her primary care physician who can treat her with      medications if necessary.  5. Nausea and vomiting secondary to pyelonephritis:  The patient's      nausea and vomiting have resolved.  She has a supply of Phenergan      at home from a prior prescription.  She is instructed to return to      the hospital if she develops any further problems with intractable      nausea and vomiting.   DISPOSITION:  The patient is medically stable and will be discharged  home.    Time spent coordinating care for discharge and discharge instructions  equals 35 minutes.      Hillery Aldo, M.D.  Electronically Signed     CR/MEDQ  D:  05/31/2009  T:  05/31/2009  Job:  [id number]

## 2011-05-14 NOTE — Group Therapy Note (Signed)
Dawn Downs, Dawn Downs             ACCOUNT NO.:  [id number]   MEDICAL RECORD NO.:  [id number]          PATIENT TYPE:  INP   LOCATION:  1410                         FACILITY:  [facility]   PHYSICIAN:  Peggye Pitt, M.D. DATE OF BIRTH:  1976-01-28                                 PROGRESS NOTE   DIAGNOSES UP TO DATE INCLUDE:  1. Acute gallstone pancreatitis.  2. Fever, resolved.  3. Leukocytosis, improved.  4. Morbid obesity.  5. Recent left ankle fracture.   Discharge medications will be dictated at time of actual discharge by  discharging physician.   HOSPITAL COURSE UP-TO-DATE:  Dawn Downs is a pleasant 35 year old morbidly obese Caucasian lady  initially admitted on June 05, 2009 with worsening abdominal pain, nausea  and vomiting.  She had [facility]  and given treatment for urinary tract infection and at that time had a  CAT scan performed that showed gallstones and pancreatitis.  Hence, she  was admitted for further evaluation and management.  Dawn Downs while  in the hospital has been evaluated by both [facility] and by surgery.  She  initially had very high transaminases which are now resolving and this  prompted initial evaluation by GI for consideration of possibly an ERCP.  However, liver function tests have slowly started to trend down towards  normal although her AST is still slightly elevated at 45 as well as her  ALT at 52.  Surgery has evaluated her and said that in the near future,  she will require a laparoscopic cholecystectomy once her acute  pancreatitis has cooled down.  Because of her prolonged n.p.o. state,  a jejunal tube was placed and jejunal tube feedings have been initiated.  As per Dr. Donavan Burnet note today, plan is to recheck another CAT scan  later this week possibly on Thursday and at that time I will possibly  start reintroducing p.o.'s and cutting back on the tube feedings.  Mrs.  Downs is currently doing much  better.  Her pain is much improved and  she is no longer having any nausea or vomiting.  Her fever has resolved.  Her leukocytosis which was quite elevated initially at over 23,000 is  now almost back to normal at 11.4.   Barriers to discharge at this point would include making sure that she  can tolerate p.o.'s and making sure that she has adequate follow up with  surgery.      Peggye Pitt, M.D.  Electronically Signed     EH/MEDQ  D:  06/13/2009  T:  06/13/2009  Job:  [id number]

## 2011-05-14 NOTE — Consult Note (Signed)
Dawn Downs             ACCOUNT NO.:  [id number]   MEDICAL RECORD NO.:  [id number]          PATIENT TYPE:  INP   LOCATION:  1410                         FACILITY:  [facility]   PHYSICIAN:  Shirley Friar, MDDATE OF BIRTH:  [birth year]   DATE OF CONSULTATION:  06/06/2009  DATE OF DISCHARGE:                                 CONSULTATION   REQUESTING PHYSICIAN:  Zannie Cove, MD, [facility]   INDICATION:  Gallstone pancreatitis.   HISTORY OF PRESENT ILLNESS:  [name] is a 35 year old white female  with polycystic ovarian disease and morbid obesity who was recently in  the hospital secondary to diagnosis of acute pyelonephritis.  She  reports onset of abdominal pain last week and was told she had a urinary  tract infection and was in the hospital for a short period of time where  pyelonephritis was diagnosed.  She did have an ultrasound at that time  which showed gallstones but no evidence of common bile duct dilation.  She reports worsening of this abdominal pain yesterday with profuse  nausea and vomiting.  She had a CT scan done which showed acute  pancreatitis and multiple gallstones in the gallbladder.  There is a  question of calcifications in the distal common bile duct.  Her liver  function tests show a T. bili of 2.0, ALP 146, AST 310, ALT 409 with a  lipase initially of 1705 on June 7 and currently 340.   PAST MEDICAL HISTORY:  1. Morbid obesity.  2. Polycystic ovarian disease.  3. History of kidneys tones.  4. Recent diagnosis of acute pyelonephritis.  5. Fatty liver disease.  6. Hyperlipidemia.   CURRENT MEDICATIONS:  Protonix, Lovenox, Senokot.  All others as needed  and listed in hospital record.   ALLERGIES:  No known drug allergies.   FAMILY HISTORY:  Noncontributory.   SOCIAL HISTORY:  Social alcohol use, denies smoking.   REVIEW OF SYSTEMS:  Negative from a GI standpoint except as stated  above.   PHYSICAL EXAMINATION:  VITAL SIGNS:   Temperature 100.4, pulse 110, blood  pressure 116/70, O2 saturation 95% on room air.  GENERAL:  Morbidly obese.  No acute distress.  ABDOMEN:  Diffusely tender with guarding, soft, nondistended, positive  bowel sounds, obese.   LABORATORIES:  White blood count 17.9, hemoglobin 12.7, platelet count  459.  Lipase 340 down from 1705, T. Bili 2.0, ALP 146, AST 310, ALT 409.   IMPRESSION:  [age]-year-old white female with 1 week of diffuse  abdominal pain, nausea and vomiting and CT evidence of acute  pancreatitis.  There is a question of common bile duct stone as well in  the setting of her gallstone pancreatitis.  If her liver function tests  continue to rise, on June 07, 2009 we will plan to do an endoscopic  retrograde cholangiopancreatography.  Otherwise we will recommend  surgery plus intraoperative cholangiogram.  Place patient on bowel rest  except ice ships and sips of water.  Discontinue planned nasogastric  tube since patient is now vomiting and no signs of intestinal  obstruction.  Follow lipase and liver function  tests closely.  I am  going to discuss her case with one of my colleagues who will potentially  be the physician doing the endoscopic retrograde  cholangiopancreatography if necessary.      Shirley Friar, MD  Electronically Signed     VCS/MEDQ  D:  06/06/2009  T:  06/06/2009  Job:  [id number]

## 2011-10-01 LAB — URINALYSIS, ROUTINE W REFLEX MICROSCOPIC
Ketones, ur: NEGATIVE
Nitrite: NEGATIVE
Protein, ur: NEGATIVE
Urobilinogen, UA: 2 — ABNORMAL HIGH

## 2011-10-01 LAB — URINE MICROSCOPIC-ADD ON

## 2011-10-01 LAB — POCT PREGNANCY, URINE: Preg Test, Ur: NEGATIVE

## 2015-05-11 DIAGNOSIS — K851 Biliary acute pancreatitis without necrosis or infection: Secondary | ICD-10-CM | POA: Insufficient documentation

## 2015-05-11 DIAGNOSIS — E282 Polycystic ovarian syndrome: Secondary | ICD-10-CM | POA: Insufficient documentation

## 2015-11-15 LAB — HM PAP SMEAR

## 2017-04-07 ENCOUNTER — Ambulatory Visit (HOSPITAL_COMMUNITY)
Admission: EM | Admit: 2017-04-07 | Discharge: 2017-04-07 | Disposition: A | Discharge status: Home / Self Care | Payer: 59 | Attending: Internal Medicine | Admitting: Internal Medicine

## 2017-04-07 ENCOUNTER — Encounter (HOSPITAL_COMMUNITY): Payer: Self-pay | Admitting: *Deleted

## 2017-04-07 DIAGNOSIS — R05 Cough: Secondary | ICD-10-CM

## 2017-04-07 DIAGNOSIS — H6503 Acute serous otitis media, bilateral: Secondary | ICD-10-CM

## 2017-04-07 DIAGNOSIS — J208 Acute bronchitis due to other specified organisms: Secondary | ICD-10-CM

## 2017-04-07 DIAGNOSIS — R059 Cough, unspecified: Secondary | ICD-10-CM

## 2017-04-07 HISTORY — DX: Type 2 diabetes mellitus without complications: E11.9

## 2017-04-07 HISTORY — DX: Essential (primary) hypertension: I10

## 2017-04-07 MED ORDER — BENZONATATE 100 MG PO CAPS: 200.0000 mg | ORAL_CAPSULE | Freq: Three times a day (TID) | ORAL | 0 refills | Status: DC | PRN

## 2017-04-07 MED ORDER — AMOXICILLIN 875 MG PO TABS: 875.0000 mg | ORAL_TABLET | Freq: Two times a day (BID) | ORAL | 0 refills | Status: DC

## 2017-04-07 NOTE — ED Triage Notes (Signed)
Coughing  And  Earache   With   Slight  Congested    Cough  Is  Productive  Off and  On

## 2017-04-07 NOTE — ED Provider Notes (Signed)
CSN: [id number]     Arrival date & time 04/07/17  1022 History   First MD Initiated Contact with Patient 04/07/17 1159     Chief Complaint  Patient presents with  . Cough   (Consider location/radiation/quality/duration/timing/severity/associated sxs/prior Treatment) Patient has cough and bilateral ear pain and discomfort.   The history is provided by the patient.  Cough  Cough characteristics:  Productive Sputum characteristics:  Nondescript Severity:  Moderate Onset quality:  Sudden Duration:  1 week Timing:  Constant Progression:  Worsening Chronicity:  New Relieved by:  Nothing Worsened by:  Nothing Ineffective treatments:  None tried   Past Medical History:  Diagnosis Date  . Diabetes mellitus without complication (HCC)   . Hypertension    Past Surgical History:  Procedure Laterality Date  . CHOLECYSTECTOMY     History reviewed. No pertinent family history. Social History  Substance Use Topics  . Smoking status: Never Smoker  . Smokeless tobacco: Never Used  . Alcohol use Yes   OB History    No data available     Review of Systems  Constitutional: Positive for fatigue.  HENT: Negative.   Eyes: Negative.   Respiratory: Positive for cough.   Cardiovascular: Negative.   Gastrointestinal: Negative.   Endocrine: Negative.   Genitourinary: Negative.   Musculoskeletal: Negative.   Allergic/Immunologic: Negative.   Neurological: Negative.   Hematological: Negative.   Psychiatric/Behavioral: Negative.     Allergies  Patient has no known allergies.  Home Medications   Prior to Admission medications   Medication Sig Start Date End Date Taking? Authorizing Provider  amoxicillin (AMOXIL) 875 MG tablet Take 1 tablet (875 mg total) by mouth 2 (two) times daily. 04/07/17   Deatra Canter, FNP  benzonatate (TESSALON) 100 MG capsule Take 2 capsules (200 mg total) by mouth 3 (three) times daily as needed for cough. 04/07/17   Deatra Canter, FNP   Meds Ordered  and Administered this Visit  Medications - No data to display  BP 131/83 (BP Location: Right Arm)   Pulse 89   Temp 98.6 F (37 C) (Oral)   Resp 16   LMP 03/19/2017   SpO2 100%  No data found.   Physical Exam  Constitutional: She appears well-developed and well-nourished.  HENT:  Head: Normocephalic and atraumatic.  Mouth/Throat: Oropharynx is clear and moist.  Bilateral TM's erythematous and dull bilateral  Eyes: Conjunctivae and EOM are normal. Pupils are equal, round, and reactive to light.  Neck: Normal range of motion. Neck supple.  Cardiovascular: Normal rate, regular rhythm and normal heart sounds.   Pulmonary/Chest: Effort normal and breath sounds normal.  Abdominal: Soft. Bowel sounds are normal.  Nursing note and vitals reviewed.   Urgent Care Course     Procedures (including critical care time)  Labs Review Labs Reviewed - No data to display  Imaging Review No results found.   Visual Acuity Review  Right Eye Distance:   Left Eye Distance:   Bilateral Distance:    Right Eye Near:   Left Eye Near:    Bilateral Near:         MDM   1. Bilateral acute serous otitis media, recurrence not specified   2. Cough   3. Acute bronchitis due to other specified organisms    Amoxicillin  one po bid x 7 days #14 Tessalon Perles  Push po fluids, rest, tylenol and motrin otc prn as directed for fever, arthralgias, and myalgias.  Follow up prn if sx's continue  or persist.    Deatra Canter, FNP 04/07/17 [phone]

## 2017-10-29 ENCOUNTER — Ambulatory Visit: Payer: 59 | Admitting: Family Medicine

## 2017-11-14 ENCOUNTER — Other Ambulatory Visit: Payer: Self-pay

## 2017-11-14 ENCOUNTER — Encounter: Payer: Self-pay | Admitting: Family Medicine

## 2017-11-14 ENCOUNTER — Ambulatory Visit (INDEPENDENT_AMBULATORY_CARE_PROVIDER_SITE_OTHER): Payer: 59 | Admitting: Family Medicine

## 2017-11-14 DIAGNOSIS — E1165 Type 2 diabetes mellitus with hyperglycemia: Secondary | ICD-10-CM | POA: Diagnosis not present

## 2017-11-14 DIAGNOSIS — I10 Essential (primary) hypertension: Secondary | ICD-10-CM

## 2017-11-14 DIAGNOSIS — E119 Type 2 diabetes mellitus without complications: Secondary | ICD-10-CM | POA: Insufficient documentation

## 2017-11-14 DIAGNOSIS — I152 Hypertension secondary to endocrine disorders: Secondary | ICD-10-CM | POA: Insufficient documentation

## 2017-11-14 DIAGNOSIS — Z23 Encounter for immunization: Secondary | ICD-10-CM | POA: Diagnosis not present

## 2017-11-14 LAB — BASIC METABOLIC PANEL
BUN: 15 mg/dL (ref 6–23)
CALCIUM: 9.8 mg/dL (ref 8.4–10.5)
CO2: 27 meq/L (ref 19–32)
CREATININE: 0.54 mg/dL (ref 0.40–1.20)
Chloride: 98 mEq/L (ref 96–112)
GFR: 131.8 mL/min (ref >60.00)
GLUCOSE: 358 mg/dL — AB (ref 70–99)
Potassium: 5.3 mEq/L — ABNORMAL HIGH (ref 3.5–5.1)
Sodium: 136 mEq/L (ref 135–145)

## 2017-11-14 LAB — CBC WITH DIFFERENTIAL/PLATELET
BASOS ABS: 0.1 10*3/uL (ref 0.0–0.1)
Basophils Relative: 0.6 % (ref 0.0–3.0)
EOS ABS: 0.2 10*3/uL (ref 0.0–0.7)
Eosinophils Relative: 1.6 % (ref 0.0–5.0)
HEMATOCRIT: 44.9 % (ref 36.0–46.0)
Hemoglobin: 14.7 g/dL (ref 12.0–15.0)
LYMPHS PCT: 19.9 % (ref 12.0–46.0)
Lymphs Abs: 2 10*3/uL (ref 0.7–4.0)
MCHC: 32.7 g/dL (ref 30.0–36.0)
MCV: 82.1 fl (ref 78.0–100.0)
Monocytes Absolute: 0.5 10*3/uL (ref 0.1–1.0)
Monocytes Relative: 4.6 % (ref 3.0–12.0)
NEUTROS ABS: 7.5 10*3/uL (ref 1.4–7.7)
Neutrophils Relative %: 73.3 % (ref 43.0–77.0)
PLATELETS: 326 10*3/uL (ref 150.0–400.0)
RBC: 5.47 Mil/uL — AB (ref 3.87–5.11)
RDW: 14.6 % (ref 11.5–15.5)
WBC: 10.3 10*3/uL (ref 4.0–10.5)

## 2017-11-14 LAB — LIPID PANEL
CHOLESTEROL: 241 mg/dL — AB (ref 0–200)
HDL: 25.6 mg/dL — AB (ref >39.00)
LDL CALC: 187 mg/dL — AB (ref 0–99)
NonHDL: 215.86
TRIGLYCERIDES: 145 mg/dL (ref 0.0–149.0)
Total CHOL/HDL Ratio: 9
VLDL: 29 mg/dL (ref 0.0–40.0)

## 2017-11-14 LAB — HEMOGLOBIN A1C: HEMOGLOBIN A1C: 11.1 % — AB (ref 4.6–6.5)

## 2017-11-14 LAB — HEPATIC FUNCTION PANEL
ALBUMIN: 4.2 g/dL (ref 3.5–5.2)
ALT: 50 U/L — AB (ref 0–35)
AST: 29 U/L (ref 0–37)
Alkaline Phosphatase: 93 U/L (ref 39–117)
BILIRUBIN DIRECT: 0.1 mg/dL (ref 0.0–0.3)
TOTAL PROTEIN: 6.7 g/dL (ref 6.0–8.3)
Total Bilirubin: 0.8 mg/dL (ref 0.2–1.2)

## 2017-11-14 LAB — TSH: TSH: 2.4 u[IU]/mL (ref 0.35–4.50)

## 2017-11-14 MED ORDER — LOSARTAN POTASSIUM 50 MG PO TABS: 50.0000 mg | ORAL_TABLET | Freq: Every day | ORAL | 3 refills | Status: DC

## 2017-11-14 MED ORDER — METFORMIN HCL 1000 MG PO TABS: ORAL_TABLET | ORAL | 3 refills | Status: DC

## 2017-11-14 NOTE — Patient Instructions (Signed)
Follow up in 1 month to recheck BP We'll notify you of your lab results and make any changes if needed START the Losartan once daily for BP START the Metformin twice daily for sugars- start w/ 1/2 tab twice daily and then increase to 1 tab twice daily Start to be mindful of carb intake and try and get regular exercise PLEASE SCHEDULE YOUR EYE EXAM!! Call with any questions or concerns Happy Thanksgiving!!! Welcome!  We're glad to have you!!!

## 2017-11-14 NOTE — Assessment & Plan Note (Signed)
Pt admits that she has not been taking care of herself or her diabetes.  Last A1C was 9.5 and she has not been on any medication recently.  Previously tolerated Metformin but struggled w/ Invokana due to frequent UTIs and yeast infections.  Due to hx of pancreatitis, Victoza and similar meds are not recommended.  Restart Metformin and stressed need for diet and exercise.  Foot exam done today.  Restarted ARB for renal protection.  Encouraged her to schedule eye exam.  Check labs.  Will follow closely.

## 2017-11-14 NOTE — Assessment & Plan Note (Signed)
Pt's BMI is 39 but she has multiple comorbidities that risk stratify into the morbid obesity category.  Stressed need for healthy diet and regular exercise. Check labs to risk stratify.  Will follow.

## 2017-11-14 NOTE — Progress Notes (Signed)
   Subjective:    Patient ID: Dawn DadaJennifer L Stineman, female    DOB: 24-Oct-1976, 41 y.o.   MRN: [id number]  HPI New to establish.  Previous MD- Duanne Guessewey  Endo- Doerr  GYN- Huel CoteKathy Richardson  HTN- pt's BP is 145/100.  Was previously on Lisinopril- caused a cough.  Switched to Losartan- not currently taking.  Pt was able to tolerate ARB w/o difficulty.  No CP, SOB, HAs, visual changes, edema.  DM- last A1C reading 9.5 from May 2017.  Was previously on Metformin and Invokana- but this caused frequent UTIs.  Overdue for eye exam.  No numbness/tingling of hands/feet.  Denies increased thirst, urination, hunger.  Pt reports 50 lbs of unintentional weight loss over the last year.  Hx of pancreatitis  Morbid obesity- pt's current BMI is 39.65 but she does have multiple co-morbidities placing her in the morbid category.  Review of Systems For ROS see HPI     Objective:   Physical Exam  Constitutional: She is oriented to person, place, and time. She appears well-developed and well-nourished. No distress.  obese  HENT:  Head: Normocephalic and atraumatic.  Eyes: Conjunctivae and EOM are normal. Pupils are equal, round, and reactive to light.  Neck: Normal range of motion. Neck supple. No thyromegaly present.  Cardiovascular: Normal rate, regular rhythm, normal heart sounds and intact distal pulses.  No murmur heard. Pulmonary/Chest: Effort normal and breath sounds normal. No respiratory distress.  Abdominal: Soft. She exhibits no distension. There is no tenderness.  Musculoskeletal: She exhibits no edema.  Lymphadenopathy:    She has no cervical adenopathy.  Neurological: She is alert and oriented to person, place, and time.  Skin: Skin is warm and dry.  Psychiatric: She has a normal mood and affect. Her behavior is normal.  Vitals reviewed.         Assessment & Plan:

## 2017-11-14 NOTE — Assessment & Plan Note (Signed)
New to provider, ongoing for pt.  She has not been on medication recently and BP is poorly controlled.  Restart Losartan 50mg  daily and will follow closely in case we need to increase dose.  Pt expressed understanding and is in agreement w/ plan.

## 2017-11-18 ENCOUNTER — Other Ambulatory Visit: Payer: Self-pay | Admitting: General Practice

## 2017-11-18 MED ORDER — ROSUVASTATIN CALCIUM 20 MG PO TABS: 20.0000 mg | ORAL_TABLET | Freq: Every day | ORAL | 6 refills | Status: DC

## 2017-12-05 ENCOUNTER — Telehealth: Payer: Self-pay | Admitting: Family Medicine

## 2017-12-05 ENCOUNTER — Ambulatory Visit: Payer: Self-pay | Admitting: *Deleted

## 2017-12-05 ENCOUNTER — Other Ambulatory Visit (INDEPENDENT_AMBULATORY_CARE_PROVIDER_SITE_OTHER): Payer: 59

## 2017-12-05 DIAGNOSIS — R399 Unspecified symptoms and signs involving the genitourinary system: Secondary | ICD-10-CM

## 2017-12-05 LAB — POCT URINALYSIS DIPSTICK
Bilirubin, UA: NEGATIVE
Blood, UA: NEGATIVE
Leukocytes, UA: NEGATIVE
Nitrite, UA: NEGATIVE
PH UA: 6 (ref 5.0–8.0)
UROBILINOGEN UA: 0.2 U/dL

## 2017-12-05 NOTE — Telephone Encounter (Signed)
Please advise pt last seen 11/24/17 for DM

## 2017-12-05 NOTE — Telephone Encounter (Signed)
Pt thinks she has a UTI. She is having some pain, difficulty voiding and no fever. She is requesting a med  called in for it. She has had UTI before. Advised pt that she would need to be seen before getting a prescription called in.. She can not get away because of her job. Home care given to her. Pt stated that she would follow this advise.  Reason for Disposition . All other patients with painful urination(Exception: [1] EITHER frequency or urgency AND [2] has on-call doctor)  Answer Assessment - Initial Assessment Questions 1. SYMPTOM: "What's the main symptom you're concerned about?" (e.g., frequency, incontinence)     Feels like she has to but can not go 2. ONSET: "When did the  ________  start?"     Last couple of days 3. PAIN: "Is there any pain?" If so, ask: "How bad is it?" (Scale: 1-10; mild, moderate, severe)     Yes #7 4. CAUSE: "What do you think is causing the symptoms?"     UTI 5. OTHER SYMPTOMS: "Do you have any other symptoms?" (e.g., fever, flank pain, blood in urine, pain with urination)     Pain with urination 6. PREGNANCY: "Is there any chance you are pregnant?" "When was your last menstrual period?"     LMP 2 weeks ago (during Thanksgiving)  Protocols used: URINATION PAIN - FEMALE-A-AH, [facility]IN[facility]

## 2017-12-05 NOTE — Telephone Encounter (Signed)
PEC flow coordinator spoke with pt, she is coming in to drop off a urine.

## 2017-12-05 NOTE — Telephone Encounter (Signed)
Called pt back and told her about the Saturday clinic and she said that she could not make it. Requesting someone give her a call back from the office. Her # is [phone].

## 2017-12-05 NOTE — Telephone Encounter (Signed)
Copied from CRM [id number]. Topic: General - Other >> Dec 05, 2017 10:43 AM Cecelia ByarsGreen, Jaila Schellhorn L, RMA wrote: Reason for CRM: Called pt back and told her about the Saturday clinic and she said that she could not make it. Requesting someone give her a call back from the office. Her # is [phone].

## 2017-12-05 NOTE — Telephone Encounter (Signed)
See previous phone note.  

## 2017-12-05 NOTE — Telephone Encounter (Signed)
Pt will need either an appt or at the very least, a urine.  If she is not able to get away from work, we do have Saturday clinic hours tomorrow

## 2017-12-05 NOTE — Telephone Encounter (Signed)
Patient coming in for Urine Sample

## 2017-12-06 LAB — URINE CULTURE
MICRO NUMBER: 81379872
SPECIMEN QUALITY:: ADEQUATE

## 2017-12-15 ENCOUNTER — Encounter: Payer: Self-pay | Admitting: Family Medicine

## 2017-12-15 ENCOUNTER — Ambulatory Visit (INDEPENDENT_AMBULATORY_CARE_PROVIDER_SITE_OTHER): Payer: 59 | Admitting: Family Medicine

## 2017-12-15 ENCOUNTER — Other Ambulatory Visit: Payer: Self-pay

## 2017-12-15 VITALS — BP 129/80 | HR 106 | Temp 98.0°F | Resp 16 | Ht 65.0 in | Wt 238.0 lb

## 2017-12-15 DIAGNOSIS — E1169 Type 2 diabetes mellitus with other specified complication: Secondary | ICD-10-CM | POA: Diagnosis not present

## 2017-12-15 DIAGNOSIS — E785 Hyperlipidemia, unspecified: Secondary | ICD-10-CM | POA: Diagnosis not present

## 2017-12-15 DIAGNOSIS — E1165 Type 2 diabetes mellitus with hyperglycemia: Secondary | ICD-10-CM

## 2017-12-15 DIAGNOSIS — I10 Essential (primary) hypertension: Secondary | ICD-10-CM

## 2017-12-15 NOTE — Patient Instructions (Signed)
Follow up in 2-3 months to recheck diabetes We'll notify you of your lab results and make any changes if needed Continue to work on healthy diet and regular exercise- you can do it! Continue the Losartan, Metformin, and Crestor daily Call with any questions or concerns Happy Holidays!!!

## 2017-12-15 NOTE — Assessment & Plan Note (Signed)
Chronic problem.  Restarted statin 1 month ago w/o difficulty.  Repeat LFTs and continue to follow.

## 2017-12-15 NOTE — Assessment & Plan Note (Signed)
Chronic problem.  Adequate control today.  Asymptomatic.  Check labs w/ recent addition of ARB.  Will continue to monitor.

## 2017-12-15 NOTE — Assessment & Plan Note (Signed)
Chronic problem.  Pt restarted the Metformin and initially had diarrhea but this resolved.  Check BMP w/ addition of Metformin.  Stressed need for healthy diet and regular exercise.  Will follow closely.

## 2017-12-15 NOTE — Progress Notes (Signed)
   Subjective:    Patient ID: Dawn DadaJennifer L Downs, female    DOB: [birth year], 41 y.o.   MRN: [id number]  HPI HTN- chronic problem.  Adequate control today.  BP has improved.  Due for BMP.  No CP, SOB, HAs, visual changes, edema.  Hyperlipidemia- chronic problem.  Restarted Crestor at last visit.  Due to repeat LFTs.  No abd pain, N/V.  DM- pt restarted her metformin at last visit.  Due for repeat BMP to recheck Cr.  Some diarrhea for the first week after restarting Metformin.   Review of Systems For ROS see HPI     Objective:   Physical Exam  Constitutional: She is oriented to person, place, and time. She appears well-developed and well-nourished. No distress.  obese  HENT:  Head: Normocephalic and atraumatic.  Eyes: Conjunctivae and EOM are normal. Pupils are equal, round, and reactive to light.  Neck: Normal range of motion. Neck supple. No thyromegaly present.  Cardiovascular: Normal rate, regular rhythm, normal heart sounds and intact distal pulses.  No murmur heard. Pulmonary/Chest: Effort normal and breath sounds normal. No respiratory distress.  Abdominal: Soft. She exhibits no distension. There is no tenderness.  Musculoskeletal: She exhibits no edema.  Lymphadenopathy:    She has no cervical adenopathy.  Neurological: She is alert and oriented to person, place, and time.  Skin: Skin is warm and dry.  Psychiatric: She has a normal mood and affect. Her behavior is normal.  Vitals reviewed.         Assessment & Plan:

## 2017-12-16 ENCOUNTER — Encounter: Payer: Self-pay | Admitting: General Practice

## 2017-12-16 LAB — BASIC METABOLIC PANEL
BUN/Creatinine Ratio: 20 (calc) (ref 6–22)
BUN: 10 mg/dL (ref 7–25)
CO2: 23 mmol/L (ref 20–32)
Calcium: 9.3 mg/dL (ref 8.6–10.2)
Chloride: 103 mmol/L (ref 98–110)
Creat: 0.49 mg/dL — ABNORMAL LOW (ref 0.50–1.10)
GLUCOSE: 187 mg/dL — AB (ref 65–99)
Potassium: 4.1 mmol/L (ref 3.5–5.3)
SODIUM: 136 mmol/L (ref 135–146)

## 2017-12-16 LAB — HEPATIC FUNCTION PANEL
AG Ratio: 1.9 (calc) (ref 1.0–2.5)
ALBUMIN MSPROF: 4.4 g/dL (ref 3.6–5.1)
ALT: 30 U/L — AB (ref 6–29)
AST: 16 U/L (ref 10–30)
Alkaline phosphatase (APISO): 79 U/L (ref 33–115)
BILIRUBIN DIRECT: 0.1 mg/dL (ref 0.0–0.2)
GLOBULIN: 2.3 g/dL (ref 1.9–3.7)
Indirect Bilirubin: 0.4 mg/dL (calc) (ref 0.2–1.2)
Total Bilirubin: 0.5 mg/dL (ref 0.2–1.2)
Total Protein: 6.7 g/dL (ref 6.1–8.1)

## 2017-12-16 LAB — EXTRA LAV TOP TUBE

## 2017-12-18 ENCOUNTER — Telehealth: Payer: Self-pay | Admitting: Family Medicine

## 2017-12-18 ENCOUNTER — Encounter: Payer: Self-pay | Admitting: Family Medicine

## 2017-12-18 DIAGNOSIS — E1169 Type 2 diabetes mellitus with other specified complication: Secondary | ICD-10-CM

## 2017-12-18 DIAGNOSIS — E785 Hyperlipidemia, unspecified: Principal | ICD-10-CM

## 2017-12-18 MED ORDER — METFORMIN HCL 1000 MG PO TABS: ORAL_TABLET | ORAL | 0 refills | Status: DC

## 2017-12-18 MED ORDER — ATORVASTATIN CALCIUM 20 MG PO TABS
20.0000 mg | ORAL_TABLET | Freq: Every day | ORAL | 1 refills | Status: DC
Start: 2017-12-18 — End: 2018-01-15

## 2017-12-18 MED ORDER — LOSARTAN POTASSIUM 50 MG PO TABS: 50.0000 mg | ORAL_TABLET | Freq: Every day | ORAL | 0 refills | Status: DC

## 2017-12-18 NOTE — Telephone Encounter (Signed)
MyChart message sent. Awaiting response regarding change to Atorvastatin.  Losartan and Metformin sent to mail order pharmacy in PCP absence.

## 2017-12-18 NOTE — Addendum Note (Signed)
Addended by: Waldon MerlMARTIN, Cheikh Bramble C on: 12/18/2017 02:04 PM   Modules accepted: Orders

## 2017-12-18 NOTE — Telephone Encounter (Signed)
Refills done per Marcelline MatesWilliam Martin.

## 2017-12-18 NOTE — Telephone Encounter (Signed)
Copied from CRM [id number]. Topic: General - Other >> Dec 18, 2017 10:18 AM Raquel SarnaHayes, Teresa G wrote: Pt is still needing 3 Rx's from Monday's with Dr. Beverely Lowabori.  She discussed with Dr. Beverely Lowabori to perscribe the Rx's through [facility] Mail order pharmacy -due to the 90 day supply and cost.  If the Rx's were called in to [facility] pt will not pick them up.  [facility]  She has checked with [facility] and there are no Rx's on the site for pt.  She was at work and didn't know the names for the 3 meds.

## 2017-12-18 NOTE — Telephone Encounter (Unsigned)
Copied from CRM [phone] [phone]. Topic: General - Other >> Dec 18, 2017 10:18 AM Raquel SarnaHayes, Teresa G wrote: Pt is still needing 3 Rx's from Monday's with Dr. Beverely Lowabori.  She discussed with Dr. Beverely Lowabori to perscribe the Rx's through [facility] Mail order pharmacy -due to the 90 day supply and cost.  If the [facility]'s were [facility] to [facility] pt will not pick them up.  Only wants Rx's [facility].  She has checked with [facility] and there are no Rx's on the site for pt.

## 2017-12-18 NOTE — Telephone Encounter (Signed)
See MyChart message. All has been handled.

## 2018-01-14 ENCOUNTER — Other Ambulatory Visit (INDEPENDENT_AMBULATORY_CARE_PROVIDER_SITE_OTHER): Payer: 59

## 2018-01-14 DIAGNOSIS — E1169 Type 2 diabetes mellitus with other specified complication: Secondary | ICD-10-CM

## 2018-01-14 DIAGNOSIS — E785 Hyperlipidemia, unspecified: Secondary | ICD-10-CM

## 2018-01-14 LAB — HEPATIC FUNCTION PANEL
ALK PHOS: 74 U/L (ref 39–117)
ALT: 29 U/L (ref 0–35)
AST: 17 U/L (ref 0–37)
Albumin: 4.2 g/dL (ref 3.5–5.2)
Bilirubin, Direct: 0.1 mg/dL (ref 0.0–0.3)
TOTAL PROTEIN: 6.6 g/dL (ref 6.0–8.3)
Total Bilirubin: 0.5 mg/dL (ref 0.2–1.2)

## 2018-01-14 LAB — LIPID PANEL
CHOL/HDL RATIO: 5
Cholesterol: 152 mg/dL (ref 0–200)
HDL: 28.2 mg/dL — ABNORMAL LOW (ref >39.00)
LDL CALC: 99 mg/dL (ref 0–99)
NonHDL: 123.65
Triglycerides: 121 mg/dL (ref 0.0–149.0)
VLDL: 24.2 mg/dL (ref 0.0–40.0)

## 2018-01-15 ENCOUNTER — Encounter: Payer: Self-pay | Admitting: Family Medicine

## 2018-01-15 ENCOUNTER — Other Ambulatory Visit: Payer: Self-pay | Admitting: Emergency Medicine

## 2018-01-15 MED ORDER — ATORVASTATIN CALCIUM 20 MG PO TABS: 20.0000 mg | ORAL_TABLET | Freq: Every day | ORAL | 0 refills | Status: DC

## 2018-01-15 MED ORDER — METFORMIN HCL 1000 MG PO TABS: ORAL_TABLET | ORAL | 0 refills | Status: DC

## 2018-01-15 MED ORDER — LOSARTAN POTASSIUM 50 MG PO TABS: 50.0000 mg | ORAL_TABLET | Freq: Every day | ORAL | 0 refills | Status: DC

## 2018-03-10 ENCOUNTER — Other Ambulatory Visit: Payer: Self-pay | Admitting: Family Medicine

## 2018-03-16 ENCOUNTER — Other Ambulatory Visit: Payer: Self-pay

## 2018-03-16 ENCOUNTER — Ambulatory Visit (INDEPENDENT_AMBULATORY_CARE_PROVIDER_SITE_OTHER): Payer: 59 | Admitting: Family Medicine

## 2018-03-16 ENCOUNTER — Encounter: Payer: Self-pay | Admitting: Family Medicine

## 2018-03-16 VITALS — BP 126/81 | HR 110 | Temp 98.0°F | Resp 16 | Ht 65.0 in | Wt 240.4 lb

## 2018-03-16 DIAGNOSIS — E1165 Type 2 diabetes mellitus with hyperglycemia: Secondary | ICD-10-CM

## 2018-03-16 NOTE — Assessment & Plan Note (Signed)
Ongoing issue.  Stressed need for healthy diet and regular exercise.  Will continue to follow. 

## 2018-03-16 NOTE — Progress Notes (Signed)
   Subjective:    Patient ID: Dawn Downs, female    DOB: 1976-08-01, 42 y.o.   MRN: [id number]  HPI DM- chronic problem, restarted Metformin at last visit b/c A1C was 11.1  UTD on foot exam, due for eye exam.  On ARB for renal protection.  Taking Metformin twice daily.  Not checking home CBGs.  Denies symptomatic lows.  No CP, SOB, HAs, visual changes, numbness/tingling of hands/feet.  No abd pain, N/V.   Review of Systems For ROS see HPI     Objective:   Physical Exam  Constitutional: She is oriented to person, place, and time. She appears well-developed and well-nourished. No distress.  obese  HENT:  Head: Normocephalic and atraumatic.  Eyes: Conjunctivae and EOM are normal. Pupils are equal, round, and reactive to light.  Neck: Normal range of motion. Neck supple. No thyromegaly present.  Cardiovascular: Normal rate, regular rhythm, normal heart sounds and intact distal pulses.  No murmur heard. Pulmonary/Chest: Effort normal and breath sounds normal. No respiratory distress.  Abdominal: Soft. She exhibits no distension. There is no tenderness.  Musculoskeletal: She exhibits no edema.  Lymphadenopathy:    She has no cervical adenopathy.  Neurological: She is alert and oriented to person, place, and time.  Skin: Skin is warm and dry.  Psychiatric: She has a normal mood and affect. Her behavior is normal.  Vitals reviewed.         Assessment & Plan:

## 2018-03-16 NOTE — Assessment & Plan Note (Signed)
Ongoing issue.  Pt restarted her Metformin at last visit.  She is tolerating this w/o difficulty.  She is on ARB for renal protection, UTD on foot exam, but overdue for eye exam.  Pt states she is to schedule.  Stressed need for healthy diet and regular exercise.  Check labs.  Adjust meds prn

## 2018-03-16 NOTE — Patient Instructions (Addendum)
Schedule your physical in 3-4 months (we'll recheck diabetes at this time) We'll notify you of your lab results and make any changes if needed Continue to work on healthy diet and regular exercise- you can do it! Call and schedule your eye exam! Call with any questions or concerns Happy Early Birthday!!!!

## 2018-03-17 ENCOUNTER — Other Ambulatory Visit: Payer: Self-pay | Admitting: General Practice

## 2018-03-17 LAB — BASIC METABOLIC PANEL
BUN: 13 mg/dL (ref 7–25)
CALCIUM: 9 mg/dL (ref 8.6–10.2)
CO2: 23 mmol/L (ref 20–32)
Chloride: 101 mmol/L (ref 98–110)
Creat: 0.53 mg/dL (ref 0.50–1.10)
GLUCOSE: 305 mg/dL — AB (ref 65–99)
Potassium: 3.9 mmol/L (ref 3.5–5.3)
Sodium: 134 mmol/L — ABNORMAL LOW (ref 135–146)

## 2018-03-17 LAB — HEMOGLOBIN A1C
EAG (MMOL/L): 13.5 (calc)
HEMOGLOBIN A1C: 10.1 %{Hb} — AB (ref <5.7)
MEAN PLASMA GLUCOSE: 243 (calc)

## 2018-03-17 MED ORDER — EMPAGLIFLOZIN 10 MG PO TABS: 10.0000 mg | ORAL_TABLET | Freq: Every day | ORAL | 6 refills | Status: DC

## 2018-07-08 ENCOUNTER — Ambulatory Visit (INDEPENDENT_AMBULATORY_CARE_PROVIDER_SITE_OTHER): Payer: 59 | Admitting: Family Medicine

## 2018-07-08 ENCOUNTER — Encounter: Payer: Self-pay | Admitting: Family Medicine

## 2018-07-08 ENCOUNTER — Other Ambulatory Visit: Payer: Self-pay

## 2018-07-08 VITALS — BP 122/82 | HR 83 | Temp 98.1°F | Resp 16 | Ht 65.0 in | Wt 205.1 lb

## 2018-07-08 DIAGNOSIS — E785 Hyperlipidemia, unspecified: Secondary | ICD-10-CM

## 2018-07-08 DIAGNOSIS — E1165 Type 2 diabetes mellitus with hyperglycemia: Secondary | ICD-10-CM | POA: Diagnosis not present

## 2018-07-08 DIAGNOSIS — I10 Essential (primary) hypertension: Secondary | ICD-10-CM | POA: Diagnosis not present

## 2018-07-08 DIAGNOSIS — E1169 Type 2 diabetes mellitus with other specified complication: Secondary | ICD-10-CM

## 2018-07-08 LAB — HEPATIC FUNCTION PANEL
ALK PHOS: 79 U/L (ref 39–117)
ALT: 22 U/L (ref 0–35)
AST: 17 U/L (ref 0–37)
Albumin: 4.2 g/dL (ref 3.5–5.2)
BILIRUBIN DIRECT: 0.1 mg/dL (ref 0.0–0.3)
BILIRUBIN TOTAL: 0.6 mg/dL (ref 0.2–1.2)
TOTAL PROTEIN: 6.5 g/dL (ref 6.0–8.3)

## 2018-07-08 LAB — CBC WITH DIFFERENTIAL/PLATELET
BASOS PCT: 1.1 % (ref 0.0–3.0)
Basophils Absolute: 0.1 10*3/uL (ref 0.0–0.1)
EOS ABS: 0.1 10*3/uL (ref 0.0–0.7)
Eosinophils Relative: 1.2 % (ref 0.0–5.0)
HEMATOCRIT: 38.6 % (ref 36.0–46.0)
Hemoglobin: 12.7 g/dL (ref 12.0–15.0)
LYMPHS ABS: 1.8 10*3/uL (ref 0.7–4.0)
Lymphocytes Relative: 15.4 % (ref 12.0–46.0)
MCHC: 32.9 g/dL (ref 30.0–36.0)
MCV: 83.1 fl (ref 78.0–100.0)
MONOS PCT: 5 % (ref 3.0–12.0)
Monocytes Absolute: 0.6 10*3/uL (ref 0.1–1.0)
NEUTROS ABS: 8.8 10*3/uL — AB (ref 1.4–7.7)
NEUTROS PCT: 77.3 % — AB (ref 43.0–77.0)
PLATELETS: 308 10*3/uL (ref 150.0–400.0)
RBC: 4.64 Mil/uL (ref 3.87–5.11)
RDW: 15.3 % (ref 11.5–15.5)
WBC: 11.4 10*3/uL — ABNORMAL HIGH (ref 4.0–10.5)

## 2018-07-08 LAB — BASIC METABOLIC PANEL
BUN: 16 mg/dL (ref 6–23)
CO2: 27 meq/L (ref 19–32)
Calcium: 9.5 mg/dL (ref 8.4–10.5)
Chloride: 106 mEq/L (ref 96–112)
Creatinine, Ser: 0.6 mg/dL (ref 0.40–1.20)
GFR: 116.35 mL/min (ref >60.00)
GLUCOSE: 115 mg/dL — AB (ref 70–99)
POTASSIUM: 4.9 meq/L (ref 3.5–5.1)
Sodium: 142 mEq/L (ref 135–145)

## 2018-07-08 LAB — HEMOGLOBIN A1C: HEMOGLOBIN A1C: 5.6 % (ref 4.6–6.5)

## 2018-07-08 LAB — LIPID PANEL
CHOL/HDL RATIO: 5
CHOLESTEROL: 136 mg/dL (ref 0–200)
HDL: 27.9 mg/dL — AB (ref >39.00)
LDL Cholesterol: 92 mg/dL (ref 0–99)
NonHDL: 108.58
TRIGLYCERIDES: 82 mg/dL (ref 0.0–149.0)
VLDL: 16.4 mg/dL (ref 0.0–40.0)

## 2018-07-08 LAB — TSH: TSH: 3.08 u[IU]/mL (ref 0.35–4.50)

## 2018-07-08 NOTE — Assessment & Plan Note (Signed)
Chronic problem.  Tolerating Lipitor w/o difficulty.  Check labs.  Adjust meds prn  

## 2018-07-08 NOTE — Assessment & Plan Note (Signed)
Pt never started the Jardiance prescribed at last visit but she has lost 35 lbs since then.  Due for eye exam- pt to schedule.  On ARB for renal protection.  UTD on foot exam.  Applauded efforts at diet and exercise.  Check labs.  Adjust meds prn

## 2018-07-08 NOTE — Patient Instructions (Signed)
Follow up in 3-4 months to recheck sugars We'll notify you of your lab results and make any changes if needed Keep up the good work on healthy diet and regular exercise- you look great!!! Schedule your eye exam and have them send me a copy of their report Call with any questions or concerns Have a great summer!!

## 2018-07-08 NOTE — Assessment & Plan Note (Signed)
Chronic problem.  Well controlled today.  Asymptomatic.  Check labs.  No anticipated med changes.  Will follow. 

## 2018-07-08 NOTE — Assessment & Plan Note (Signed)
Ongoing issue for pt.  She has lost 35 lbs since last visit.  Applauded her efforts- will follow.

## 2018-07-08 NOTE — Progress Notes (Signed)
   Subjective:    Patient ID: Dawn DadaJennifer L Trudel, female    DOB: [birth year], 42 y.o.   MRN: [id number]  HPI DM- chronic problem.  Last A1C was 10.1!  Since then, she has lost 35 lbs since last visit.  Restarted Weight Watchers and is walking daily.  UTD on foot exam, due for eye exam- pt to schedule, on ARB for renal protection.  Pt never started Jardiance as prescribed at last visit.  No symptomatic lows.  No numbness/tingling of hands/feet.  Hyperlipidemia- chronic problem, on Lipitor 20mg  daily.  Denies abd pain, N/V, myalgias.  HTN- chronic problem, on Losartan daily w/ good control.  Denies CP, SOB, HAs, visual changes, edema.   Review of Systems For ROS see HPI     Objective:   Physical Exam  Constitutional: She is oriented to person, place, and time. She appears well-developed and well-nourished. No distress.  HENT:  Head: Normocephalic and atraumatic.  Eyes: Pupils are equal, round, and reactive to light. Conjunctivae and EOM are normal.  Neck: Normal range of motion. Neck supple. No thyromegaly present.  Cardiovascular: Normal rate, regular rhythm, normal heart sounds and intact distal pulses.  No murmur heard. Pulmonary/Chest: Effort normal and breath sounds normal. No respiratory distress.  Abdominal: Soft. She exhibits no distension. There is no tenderness.  Musculoskeletal: She exhibits no edema.  Lymphadenopathy:    She has no cervical adenopathy.  Neurological: She is alert and oriented to person, place, and time.  Skin: Skin is warm and dry.  Psychiatric: She has a normal mood and affect. Her behavior is normal.  Vitals reviewed.         Assessment & Plan:

## 2018-07-09 ENCOUNTER — Encounter: Payer: Self-pay | Admitting: General Practice

## 2018-07-13 ENCOUNTER — Other Ambulatory Visit: Payer: Self-pay | Admitting: General Practice

## 2018-07-13 MED ORDER — METFORMIN HCL 1000 MG PO TABS: ORAL_TABLET | ORAL | 0 refills | Status: DC

## 2018-09-02 ENCOUNTER — Other Ambulatory Visit: Payer: Self-pay | Admitting: Family Medicine

## 2018-10-08 LAB — HM MAMMOGRAPHY: HM MAMMO: NORMAL (ref 0–4)

## 2018-10-08 LAB — HM PAP SMEAR

## 2018-10-21 ENCOUNTER — Other Ambulatory Visit: Payer: Self-pay | Admitting: Obstetrics and Gynecology

## 2018-10-21 ENCOUNTER — Encounter: Payer: Self-pay | Admitting: General Practice

## 2018-10-21 DIAGNOSIS — N6320 Unspecified lump in the left breast, unspecified quadrant: Secondary | ICD-10-CM

## 2018-10-27 ENCOUNTER — Encounter: Payer: Self-pay | Admitting: Family Medicine

## 2018-10-27 ENCOUNTER — Other Ambulatory Visit: Payer: Self-pay

## 2018-10-27 ENCOUNTER — Ambulatory Visit (INDEPENDENT_AMBULATORY_CARE_PROVIDER_SITE_OTHER): Payer: 59 | Admitting: Family Medicine

## 2018-10-27 VITALS — BP 120/80 | HR 76 | Temp 99.0°F | Resp 16 | Ht 65.0 in | Wt 174.4 lb

## 2018-10-27 DIAGNOSIS — E1165 Type 2 diabetes mellitus with hyperglycemia: Secondary | ICD-10-CM | POA: Diagnosis not present

## 2018-10-27 DIAGNOSIS — E663 Overweight: Secondary | ICD-10-CM | POA: Diagnosis not present

## 2018-10-27 LAB — BASIC METABOLIC PANEL
BUN: 17 mg/dL (ref 6–23)
CALCIUM: 9.9 mg/dL (ref 8.4–10.5)
CO2: 26 meq/L (ref 19–32)
CREATININE: 0.54 mg/dL (ref 0.40–1.20)
Chloride: 104 mEq/L (ref 96–112)
GFR: 131.2 mL/min (ref >60.00)
GLUCOSE: 90 mg/dL (ref 70–99)
Potassium: 4.3 mEq/L (ref 3.5–5.1)
SODIUM: 138 meq/L (ref 135–145)

## 2018-10-27 LAB — HEMOGLOBIN A1C: Hgb A1c MFr Bld: 5.2 % (ref 4.6–6.5)

## 2018-10-27 NOTE — Assessment & Plan Note (Signed)
Pt's A1C had dropped from over 10 to 5.6 at last check.  Pt has lost over 60 lbs since A1C was over 10.  Applauded her efforts.  Overdue on eye exam- pt to schedule.  Foot exam done today.  On ARB for renal protection.  Check labs and if still below 6, will stop Metformin.  Pt is excited about this possibility.

## 2018-10-27 NOTE — Assessment & Plan Note (Signed)
Pt has dropped from the morbidly obese category to overweight by losing 60+ lbs in the last 6 months!  Applauded her efforts.  Will continue to follow.

## 2018-10-27 NOTE — Patient Instructions (Signed)
Follow up in 3-4 months to recheck sugar, BP, and cholesterol We'll notify you of your lab results and make any changes if needed Keep up the good work on healthy diet and regular exercise!  You. Look. AMAZING!! SCHEDULE your eye exam!!! Call with any questions or concerns Happy Fall!!!

## 2018-10-27 NOTE — Progress Notes (Signed)
   Subjective:    Patient ID: Dawn Downs, female    DOB: Sep 26, 1976, 42 y.o.   MRN: [id number]  HPI DM- chronic problem.  Pt's last A1C was 5.6.  Since then, pt has lost another 30 lbs!  Continues to do weight watchers.  Exercising daily.  On Metformin twice daily.  On ARB for renal protection.  Due for eye exam.  No numbness/tingling of hands/feet.  No CP, SOB, HAs, visual changes, abd pain, N/V/D.  Denies symptomatic lows.   Review of Systems For ROS see HPI     Objective:   Physical Exam  Constitutional: She is oriented to person, place, and time. She appears well-developed and well-nourished. No distress.  HENT:  Head: Normocephalic and atraumatic.  Eyes: Pupils are equal, round, and reactive to light. Conjunctivae and EOM are normal.  Neck: Normal range of motion. Neck supple. No thyromegaly present.  Cardiovascular: Normal rate, regular rhythm, normal heart sounds and intact distal pulses.  No murmur heard. Pulmonary/Chest: Effort normal and breath sounds normal. No respiratory distress.  Abdominal: Soft. She exhibits no distension. There is no tenderness.  Musculoskeletal: She exhibits no edema.  Lymphadenopathy:    She has no cervical adenopathy.  Neurological: She is alert and oriented to person, place, and time.  Skin: Skin is warm and dry.  Psychiatric: She has a normal mood and affect. Her behavior is normal.  Vitals reviewed.         Assessment & Plan:

## 2019-02-04 ENCOUNTER — Encounter: Payer: Self-pay | Admitting: Family Medicine

## 2019-02-04 ENCOUNTER — Ambulatory Visit (INDEPENDENT_AMBULATORY_CARE_PROVIDER_SITE_OTHER): Payer: Commercial Managed Care - PPO | Admitting: Family Medicine

## 2019-02-04 ENCOUNTER — Encounter: Payer: Self-pay | Admitting: General Practice

## 2019-02-04 ENCOUNTER — Other Ambulatory Visit: Payer: Self-pay

## 2019-02-04 VITALS — BP 122/80 | HR 81 | Temp 98.1°F | Resp 16 | Ht 65.0 in | Wt 157.1 lb

## 2019-02-04 DIAGNOSIS — I10 Essential (primary) hypertension: Secondary | ICD-10-CM

## 2019-02-04 DIAGNOSIS — E1169 Type 2 diabetes mellitus with other specified complication: Secondary | ICD-10-CM | POA: Diagnosis not present

## 2019-02-04 DIAGNOSIS — E119 Type 2 diabetes mellitus without complications: Secondary | ICD-10-CM | POA: Diagnosis not present

## 2019-02-04 DIAGNOSIS — E785 Hyperlipidemia, unspecified: Secondary | ICD-10-CM | POA: Diagnosis not present

## 2019-02-04 LAB — BASIC METABOLIC PANEL
BUN: 15 mg/dL (ref 6–23)
CALCIUM: 9.2 mg/dL (ref 8.4–10.5)
CHLORIDE: 105 meq/L (ref 96–112)
CO2: 28 meq/L (ref 19–32)
CREATININE: 0.6 mg/dL (ref 0.40–1.20)
GFR: 109.17 mL/min (ref >60.00)
GLUCOSE: 88 mg/dL (ref 70–99)
Potassium: 4.1 mEq/L (ref 3.5–5.1)
Sodium: 140 mEq/L (ref 135–145)

## 2019-02-04 LAB — LIPID PANEL
CHOLESTEROL: 172 mg/dL (ref 0–200)
HDL: 33.1 mg/dL — AB (ref >39.00)
LDL Cholesterol: 124 mg/dL — ABNORMAL HIGH (ref 0–99)
NonHDL: 138.46
TRIGLYCERIDES: 73 mg/dL (ref 0.0–149.0)
Total CHOL/HDL Ratio: 5
VLDL: 14.6 mg/dL (ref 0.0–40.0)

## 2019-02-04 LAB — CBC WITH DIFFERENTIAL/PLATELET
BASOS PCT: 0.4 % (ref 0.0–3.0)
Basophils Absolute: 0 10*3/uL (ref 0.0–0.1)
EOS ABS: 0.1 10*3/uL (ref 0.0–0.7)
Eosinophils Relative: 0.8 % (ref 0.0–5.0)
HEMATOCRIT: 42.5 % (ref 36.0–46.0)
HEMOGLOBIN: 14.7 g/dL (ref 12.0–15.0)
LYMPHS PCT: 20.7 % (ref 12.0–46.0)
Lymphs Abs: 2.2 10*3/uL (ref 0.7–4.0)
MCHC: 34.6 g/dL (ref 30.0–36.0)
MCV: 86.8 fl (ref 78.0–100.0)
Monocytes Absolute: 0.5 10*3/uL (ref 0.1–1.0)
Monocytes Relative: 4.8 % (ref 3.0–12.0)
Neutro Abs: 7.9 10*3/uL — ABNORMAL HIGH (ref 1.4–7.7)
Neutrophils Relative %: 73.3 % (ref 43.0–77.0)
PLATELETS: 249 10*3/uL (ref 150.0–400.0)
RBC: 4.9 Mil/uL (ref 3.87–5.11)
RDW: 13.9 % (ref 11.5–15.5)
WBC: 10.8 10*3/uL — AB (ref 4.0–10.5)

## 2019-02-04 LAB — HEPATIC FUNCTION PANEL
ALT: 19 U/L (ref 0–35)
AST: 13 U/L (ref 0–37)
Albumin: 4.2 g/dL (ref 3.5–5.2)
Alkaline Phosphatase: 63 U/L (ref 39–117)
Bilirubin, Direct: 0.1 mg/dL (ref 0.0–0.3)
TOTAL PROTEIN: 6.3 g/dL (ref 6.0–8.3)
Total Bilirubin: 0.5 mg/dL (ref 0.2–1.2)

## 2019-02-04 LAB — HEMOGLOBIN A1C: HEMOGLOBIN A1C: 4.9 % (ref 4.6–6.5)

## 2019-02-04 LAB — TSH: TSH: 2.82 u[IU]/mL (ref 0.35–4.50)

## 2019-02-04 NOTE — Assessment & Plan Note (Signed)
Chronic problem.  Well controlled.  Asymptomatic.  Check labs.  No anticipated med changes.  Will follow. 

## 2019-02-04 NOTE — Patient Instructions (Signed)
Schedule your complete physical in 6 months We'll notify you of your lab results and make any changes if needed Keep up the good work!  You look great!!! Call with any questions or concerns Enjoy your trip!!!

## 2019-02-04 NOTE — Progress Notes (Signed)
   Subjective:    Patient ID: Dawn Downs, female    DOB: [birth year], 43 y.o.   MRN: [id number]  HPI HTN- chronic problem, on Losartan daily w/ good control.  No CP, SOB, HAs, visual changes, edema.  Hyperlipidemia- chronic problem, on Lipitor 20mg  daily.  No abd pain, N/V  DM- chronic problem, stopped Metformin at last visit.  She is down 17 lbs since last visit.  UTD on foot exam, on ARB for renal protection.  Has eye exam scheduled.  'I feel great'.  Continues to exercise and eat well.  No numbness/tingling of hands/feet.   Review of Systems For ROS see HPI     Objective:   Physical Exam Vitals signs reviewed.  Constitutional:      General: She is not in acute distress.    Appearance: She is well-developed.  HENT:     Head: Normocephalic and atraumatic.  Eyes:     Conjunctiva/sclera: Conjunctivae normal.     Pupils: Pupils are equal, round, and reactive to light.  Neck:     Musculoskeletal: Normal range of motion and neck supple.     Thyroid: No thyromegaly.  Cardiovascular:     Rate and Rhythm: Normal rate and regular rhythm.     Heart sounds: Normal heart sounds. No murmur.  Pulmonary:     Effort: Pulmonary effort is normal. No respiratory distress.     Breath sounds: Normal breath sounds.  Abdominal:     General: There is no distension.     Palpations: Abdomen is soft.     Tenderness: There is no abdominal tenderness.  Lymphadenopathy:     Cervical: No cervical adenopathy.  Skin:    General: Skin is warm and dry.  Neurological:     Mental Status: She is alert and oriented to person, place, and time.  Psychiatric:        Behavior: Behavior normal.           Assessment & Plan:

## 2019-02-04 NOTE — Assessment & Plan Note (Signed)
Ongoing issue for pt.  With all of her weight loss, she has been able to stop Metformin.  UTD on foot exam.  On ARB for renal protection.  Has eye exam scheduled.  Check labs.

## 2019-02-04 NOTE — Assessment & Plan Note (Signed)
Chronic problem, on Lipitor daily.  Pt has lost a considerable amount of weight.  Applauded her efforts.  Check labs.  Adjust meds prn

## 2019-02-25 LAB — HM DIABETES EYE EXAM

## 2019-03-04 ENCOUNTER — Encounter: Payer: Self-pay | Admitting: Emergency Medicine

## 2019-04-26 ENCOUNTER — Encounter: Payer: Self-pay | Admitting: Family Medicine

## 2019-04-26 ENCOUNTER — Other Ambulatory Visit: Payer: Self-pay

## 2019-04-26 ENCOUNTER — Ambulatory Visit (INDEPENDENT_AMBULATORY_CARE_PROVIDER_SITE_OTHER): Payer: No Typology Code available for payment source | Admitting: Family Medicine

## 2019-04-26 VITALS — Ht 65.0 in | Wt 144.0 lb

## 2019-04-26 DIAGNOSIS — M25512 Pain in left shoulder: Secondary | ICD-10-CM | POA: Diagnosis not present

## 2019-04-26 DIAGNOSIS — R55 Syncope and collapse: Secondary | ICD-10-CM

## 2019-04-26 NOTE — Progress Notes (Signed)
   Virtual Visit via Video   I connected with patient on 04/26/19 at  9:40 AM EDT by a video enabled telemedicine application and verified that I am speaking with the correct person using two identifiers.  Location patient: Home Location provider: [place], Office Persons participating in the virtual visit: Patient, Provider, CMA [facility] [place])  I discussed the limitations of evaluation and management by telemedicine and the availability of in person appointments. The patient expressed understanding and agreed to proceed.  Subjective:   HPI:   Syncope- was walking Saturday AM at [facility].  Friend pulled over to talk and after standing for ~30 minutes pt felt flushed, dizzy and tried to sit down.  'next thing I remember she was helping me off the ground'.  Went down 'directly on my (L) shoulder'.  Had eaten protein bar for breakfast and rice krispy treats w/ bottle of water.  Pt had 1 other episode like this in Feb- also associated w/ walking and then standing still for prolonged period of time.  L shoulder 'is pretty tender' and she is concerned for fx'd clavicle.  Painful to reach across body, painful abduction.  + road rash.  ROS:   See pertinent positives and negatives per HPI.  Patient Active Problem List   Diagnosis Date Noted  . Overweight (BMI 25.0-29.9) 10/27/2018  . Hyperlipidemia associated with type 2 diabetes mellitus (HCC) 12/15/2017  . Diet-controlled diabetes mellitus (HCC) 11/14/2017  . HTN (hypertension) 11/14/2017  . PCOS (polycystic ovarian syndrome) 05/11/2015    Social History   Tobacco Use  . Smoking status: Never Smoker  . Smokeless tobacco: Never Used  Substance Use Topics  . Alcohol use: Yes    Current Outpatient Medications:  .  norethindrone-ethinyl estradiol (JUNEL FE,GILDESS FE,LOESTRIN FE) 1-20 MG-MCG tablet, Take 1 tablet by mouth daily., Disp: , Rfl:  .  atorvastatin (LIPITOR) 20 MG tablet, TAKE 1 TABLET BY MOUTH   DAILY (Patient not taking: Reported on 04/26/2019), Disp: 90 tablet, Rfl: 0 .  losartan (COZAAR) 50 MG tablet, TAKE 1 TABLET BY MOUTH  DAILY (Patient not taking: Reported on 04/26/2019), Disp: 90 tablet, Rfl: 0  No Known Allergies  Objective:   Ht 5\' 5"  (1.651 m)   Wt 144 lb (65.3 kg)   BMI 23.96 kg/m   AAOx3, NAD NCAT, EOMI No obvious CN deficits Coloring WNL Pain w/ abduction and cross body movements of L arm Pt is able to speak clearly, coherently without shortness of breath or increased work of breathing.  Thought process is linear.  Mood is appropriate.   Assessment and Plan:   Syncope- new.  Pt had what sounds to be a vasovagal episode.  This occurred after exercising w/ prolonged standing.  Reviewed that exercise causing vasodilation and then standing allows BP to drop.  Encouraged increased fluids and cool down after exercise.  Pt expressed understanding and is in agreement w/ plan.   L shoulder pain- new.  Pt is concerned for broken clavicle or other shoulder issue.  Pain w/ cross body movement and abduction.  Refer to sports medicine for complete evaluation and tx.  Pt expressed understanding and is in agreement w/ plan.    Neena Rhymes, MD 04/26/2019

## 2019-04-26 NOTE — Progress Notes (Signed)
I have discussed the procedure for the virtual visit with the patient who has given consent to proceed with assessment and treatment.   [name], CMA     

## 2019-04-27 ENCOUNTER — Ambulatory Visit: Payer: No Typology Code available for payment source | Admitting: Sports Medicine

## 2019-04-27 ENCOUNTER — Ambulatory Visit (INDEPENDENT_AMBULATORY_CARE_PROVIDER_SITE_OTHER): Payer: No Typology Code available for payment source

## 2019-04-27 ENCOUNTER — Other Ambulatory Visit: Payer: Self-pay

## 2019-04-27 ENCOUNTER — Encounter: Payer: Self-pay | Admitting: Sports Medicine

## 2019-04-27 VITALS — BP 120/80 | HR 68 | Temp 98.1°F | Ht 65.0 in | Wt 146.0 lb

## 2019-04-27 DIAGNOSIS — M25512 Pain in left shoulder: Secondary | ICD-10-CM

## 2019-04-27 DIAGNOSIS — S43102A Unspecified dislocation of left acromioclavicular joint, initial encounter: Secondary | ICD-10-CM | POA: Diagnosis not present

## 2019-04-27 DIAGNOSIS — R55 Syncope and collapse: Secondary | ICD-10-CM | POA: Diagnosis not present

## 2019-04-27 MED ORDER — IBUPROFEN-FAMOTIDINE 800-26.6 MG PO TABS: 1.0000 | ORAL_TABLET | Freq: Three times a day (TID) | ORAL | 2 refills | Status: DC | PRN

## 2019-04-27 NOTE — Progress Notes (Signed)
Dawn Downs. Dawn Downs Sports Medicine [facility] [phone]  Dawn Downs - 43 y.o. female MRN [id number]  Date of birth: 1976/02/27  Visit Date: April 27, 2019  PCP: Sheliah Hatch, MD   Referred by: Sheliah Hatch, MD  SUBJECTIVE:  Chief Complaint  Patient presents with  . Left Shoulder - Initial Assessment, Pain, Injury  . New Patient (Initial Visit)    L shoulder / clavicle pain    HPI: Fall over the weekend 2o/2 vasovagal syncope after walk.  See Dr. Rennis Golden note. 1 prior episode Pain is lateral on shoulder. Worse with overhead and cross body motion. No prior shoulder issues. Still able to strap her bra  REVIEW OF SYSTEMS: No significant nighttime awakenings due to this issue. Denies fevers, chills, recent weight gain or weight loss.  No night sweats.  Pt denies any change in bowel or bladder habits, muscle weakness, numbness or falls associated with this pain.  HISTORY:  Prior history reviewed and updated per electronic medical record.  Patient Active Problem List   Diagnosis Date Noted  . Overweight (BMI 25.0-29.9) 10/27/2018  . Hyperlipidemia associated with type 2 diabetes mellitus (HCC) 12/15/2017  . Diet-controlled diabetes mellitus (HCC) 11/14/2017  . HTN (hypertension) 11/14/2017  . PCOS (polycystic ovarian syndrome) 05/11/2015   Social History   Occupational History  . Not on file  Tobacco Use  . Smoking status: Never Smoker  . Smokeless tobacco: Never Used  Substance and Sexual Activity  . Alcohol use: Yes  . Drug use: No  . Sexual activity: Never   Social History   Social History Narrative  . Not on file   Past Medical History:  Diagnosis Date  . Diabetes mellitus without complication (HCC)   . GERD (gastroesophageal reflux disease)   . Hyperlipidemia   . Hypertension   . Pancreatitis due to common bile duct stone   . UTI (urinary tract infection)    Past Surgical History:   Procedure Laterality Date  . CHOLECYSTECTOMY    . OVARIAN CYST REMOVAL     family history includes Alcohol abuse in her maternal grandfather; Cancer in her maternal grandmother; Diabetes in her father and paternal grandmother; Gestational diabetes in her sister; Hearing loss in her paternal grandfather; Heart attack in her maternal grandfather and paternal grandfather; Heart disease in her maternal grandfather, maternal grandmother, and paternal grandmother; Hypertension in her maternal grandmother and mother.  OBJECTIVE:  VS:  HT:5\' 5"  (165.1 cm)   WT:146 lb (66.2 kg)  BMI:24.3    BP:120/80  HR:68bpm  TEMP:98.1 F (36.7 C)( )  RESP:98 %   PHYSICAL EXAM: CONSTITUTIONAL: Well-developed, Well-nourished and In no acute distress EYES: Pupils are equal., EOM intact without nystagmus. and No scleral icterus. Psychiatric: Alert & appropriately interactive. and Not depressed or anxious appearing. EXTREMITY EXAM: Warm and well perfused   Shoulder with F ROM.   Shoulder excoriation over posteriolateral aspect. No weakness with RC testing Negative SPeeds, obriens and Empty can Slight pain with direct palp over [place] and AC joint  X-rays negative     ASSESSMENT:  1. Acute pain of left shoulder   2. Acromioclavicular joint separation, left, initial encounter   3. Vasovagal syncope     PROCEDURES:  PROCEDURE NOTE: THERAPEUTIC EXERCISES (94854)   Discussed the foundation of treatment for this condition is physical therapy and/or daily (5-6 days/week) therapeutic exercises, focusing on core strengthening, coordination, neuromuscular control/reeducation. 15 minutes spent for Therapeutic exercises  as below and as referenced in the AVS. This included exercises focusing on stretching, strengthening, with significant focus on eccentric aspects.  Proper technique shown and discussed handout in great detail with ATC. All questions were discussed and answered.   Long term goals include an  improvement in range of motion, strength, endurance as well as avoiding reinjury. Frequency of visits is one time as determined during today's office visit. Frequency of exercises to be performed is as per handout.  EXERCISES REVIEWED:  Intrinsic Rotator Cuff Exercises      PLAN:  Pertinent additional documentation may be included in corresponding procedure notes, imaging studies, problem based documentation and patient instructions.  No problem-specific Assessment & Plan notes found for this encounter.  Grade 1 ac separation HEP reviewed Avoid exacerbating activities  Home Therapeutic Exercises: New program prescribed today per procedure note.  Activity modifications and the importance of avoiding exacerbating activities (limiting pain to no more than a 4 / 10 during or following activity) recommended and discussed.   Discussed red flag symptoms that warrant earlier emergent evaluation and patient voices understanding.    Meds ordered this encounter  Medications  . Ibuprofen-Famotidine (DUEXIS) 800-26.6 MG TABS    Sig: Take 1 tablet by mouth 3 (three) times daily as needed. 1 tab po tid X 14 days then 1 tab po tid as needed    Dispense:  90 tablet    Refill:  2    Home Phone      [phone] Work Phone      [phone] Mobile          [phone]    Lab Orders  No laboratory test(s) ordered today    Imaging Orders  DG Shoulder Left  Referral Orders  No referral(s) requested today    Return in about 2 weeks (around 05/11/2019) for virtual visit for L shoulder.          Andrena MewsMichael D Rigby, DO    [facility] Physician

## 2019-04-27 NOTE — Patient Instructions (Addendum)
Please perform the exercise program that we have prepared for you and gone over in detail on a daily basis.  In addition to the handout you were provided you can access your program through: [email or web address]   Your unique program code is:  [email or web address]    Instructions for [place], [place] and [place]:  Your prescription will be filled through a participating [facility] mail order pharmacy.  You will receive a phone call or text from one of the participating pharmacies which can be located in any state in the [place].  You must communicate directly with them to have this medication filled.  When the pharmacy contacts you, they will need your mailing address (for shipment of the medication) andy they will need payment information if you have a copay (typically no more than $10). If you have not heard from them 2-3 days after your appointment with Dr. Berline Chough, contact [facility] directly at [phone].

## 2019-04-27 NOTE — Progress Notes (Deleted)
  Dawn Downs. [name] Sports Medicine [facility] at [facility] [phone]  Dawn Downs - 43 y.o. female MRN [id number]  Date of birth: [birth year]  Visit Date: April 27, 2019  PCP: Sheliah Hatch, MD   Referred by: Sheliah Hatch, MD  SUBJECTIVE:  No chief complaint on file.   HPI: ***  REVIEW OF SYSTEMS: {LBSMROS:22225}  HISTORY:  Prior history reviewed and updated per electronic medical record.  Patient Active Problem List   Diagnosis Date Noted  . Overweight (BMI 25.0-29.9) 10/27/2018  . Hyperlipidemia associated with type 2 diabetes mellitus (HCC) 12/15/2017  . Diet-controlled diabetes mellitus (HCC) 11/14/2017  . HTN (hypertension) 11/14/2017  . PCOS (polycystic ovarian syndrome) 05/11/2015   Social History   Occupational History  . Not on file  Tobacco Use  . Smoking status: Never Smoker  . Smokeless tobacco: Never Used  Substance and Sexual Activity  . Alcohol use: Yes  . Drug use: No  . Sexual activity: Never   Social History   Social History Narrative  . Not on file   {LBSM:21311}  OBJECTIVE:  VS:  HT:    WT:   BMI:     BP:   HR: bpm  TEMP: ( )  RESP:    PHYSICAL EXAM: {lbsmexam:21169} ***   ASSESSMENT:  No diagnosis found.  PROCEDURES:  {LBSMPROCEDURELISTS:21861::"  "}  PLAN:  Pertinent additional documentation may be included in corresponding procedure notes, imaging studies, problem based documentation and patient instructions.  No problem-specific Assessment & Plan notes found for this encounter.   ***  {LBSM TherEx:22158}  {LBSMPLAN (Optional):21556}  {LBSMPLAN (Optional):22226::"Activity modifications and the importance of avoiding exacerbating activities (limiting pain to no more than a 4 / 10 during or following activity) recommended and discussed."," ","Discussed red flag symptoms that warrant earlier emergent evaluation and patient voices understanding."," "}  No orders of the  defined types were placed in this encounter.  Lab Orders  No laboratory test(s) ordered today   Imaging Orders  No imaging studies ordered today   Referral Orders  No referral(s) requested today    {At follow up will plan to consider (Optional):21890}  No follow-ups on file.          Andrena Mews, DO    [place] Physician

## 2019-05-11 ENCOUNTER — Ambulatory Visit: Payer: No Typology Code available for payment source | Admitting: Sports Medicine

## 2019-06-15 ENCOUNTER — Other Ambulatory Visit: Payer: No Typology Code available for payment source

## 2019-06-23 ENCOUNTER — Ambulatory Visit: Payer: No Typology Code available for payment source

## 2019-06-23 ENCOUNTER — Other Ambulatory Visit: Payer: Self-pay | Admitting: Obstetrics and Gynecology

## 2019-06-23 ENCOUNTER — Ambulatory Visit
Admission: RE | Admit: 2019-06-23 | Discharge: 2019-06-23 | Disposition: A | Discharge status: Home / Self Care | Payer: No Typology Code available for payment source | Source: Ambulatory Visit | Attending: Obstetrics and Gynecology | Admitting: Obstetrics and Gynecology

## 2019-06-23 DIAGNOSIS — N6320 Unspecified lump in the left breast, unspecified quadrant: Secondary | ICD-10-CM

## 2019-08-04 ENCOUNTER — Encounter: Payer: Commercial Managed Care - PPO | Admitting: Family Medicine

## 2019-11-29 DIAGNOSIS — F321 Major depressive disorder, single episode, moderate: Secondary | ICD-10-CM | POA: Diagnosis not present

## 2019-12-14 ENCOUNTER — Encounter: Payer: Self-pay | Admitting: Family Medicine

## 2019-12-14 ENCOUNTER — Ambulatory Visit (INDEPENDENT_AMBULATORY_CARE_PROVIDER_SITE_OTHER): Payer: BC Managed Care – PPO | Admitting: Family Medicine

## 2019-12-14 ENCOUNTER — Other Ambulatory Visit: Payer: Self-pay

## 2019-12-14 DIAGNOSIS — Z0000 Encounter for general adult medical examination without abnormal findings: Secondary | ICD-10-CM

## 2019-12-14 DIAGNOSIS — E119 Type 2 diabetes mellitus without complications: Secondary | ICD-10-CM | POA: Diagnosis not present

## 2019-12-14 NOTE — Progress Notes (Signed)
I have discussed the procedure for the virtual visit with the patient who has given consent to proceed with assessment and treatment.   Pt unable to obtain vitals.   Lonetta Blassingame L Danie Diehl, CMA     

## 2019-12-14 NOTE — Progress Notes (Signed)
   Virtual Visit via Video   I connected with patient on 12/14/19 at  9:00 AM EST by a video enabled telemedicine application and verified that I am speaking with the correct person using two identifiers.  Location patient: Home Location provider: [facility], Office Persons participating in the virtual visit: Patient, Provider, [place] (Jess B)  I discussed the limitations of evaluation and management by telemedicine and the availability of in person appointments. The patient expressed understanding and agreed to proceed.  Subjective:   HPI:   CPE- UTD on pap, mammo.  Due for flu shot.  Pt's last weight was 180 (up from 146).  Continues to exercise regularly.  Admits to poor nutrition recently but plans to rectify in the new year.  ROS:   Patient reports no vision/ hearing changes, adenopathy,fever, persistant/recurrent hoarseness , swallowing issues, chest pain, palpitations, edema, persistant/recurrent cough, hemoptysis, dyspnea (rest/exertional/paroxysmal nocturnal), gastrointestinal bleeding (melena, rectal bleeding), abdominal pain, significant heartburn, bowel changes, GU symptoms (dysuria, hematuria, incontinence), Gyn symptoms (abnormal  bleeding, pain),  syncope, focal weakness, memory loss, numbness & tingling, skin/hair/nail changes, abnormal bruising or bleeding, anxiety, or depression.   Patient Active Problem List   Diagnosis Date Noted  . Overweight (BMI 25.0-29.9) 10/27/2018  . Hyperlipidemia associated with type 2 diabetes mellitus ([place]) 12/15/2017  . Diet-controlled diabetes mellitus ([place]) 11/14/2017  . HTN (hypertension) 11/14/2017  . PCOS (polycystic ovarian syndrome) 05/11/2015    Social History   Tobacco Use  . Smoking status: Never Smoker  . Smokeless tobacco: Never Used  Substance Use Topics  . Alcohol use: Yes    Current Outpatient Medications:  .  norethindrone-ethinyl estradiol (JUNEL FE,GILDESS FE,LOESTRIN FE) 1-20 MG-MCG tablet, Take 1 tablet  by mouth daily., Disp: , Rfl:   No Known Allergies  Objective:   There were no vitals taken for this visit. AAOx3, NAD NCAT, EOMI No obvious CN deficits Coloring WNL Pt is able to speak clearly, coherently without shortness of breath or increased work of breathing.  Thought process is linear.  Mood is appropriate.   Assessment and Plan:   CPE- limited video PE WNL.  Due for flu (will try and get when she comes for labs).  UTD on pap and mammo.  Check labs.  Anticipatory guidance provided.   DM- this has been controlled w/ diet and exercise.  Pt is aware that she has regained some of the weight she has lost but plans to rectify this in the new year.  Will get A1C to determine if change in tx is needed.   Annye Asa, MD 12/14/2019

## 2019-12-17 ENCOUNTER — Ambulatory Visit (INDEPENDENT_AMBULATORY_CARE_PROVIDER_SITE_OTHER): Payer: BC Managed Care – PPO

## 2019-12-17 ENCOUNTER — Other Ambulatory Visit (INDEPENDENT_AMBULATORY_CARE_PROVIDER_SITE_OTHER): Payer: BC Managed Care – PPO

## 2019-12-17 ENCOUNTER — Other Ambulatory Visit: Payer: Self-pay

## 2019-12-17 DIAGNOSIS — E119 Type 2 diabetes mellitus without complications: Secondary | ICD-10-CM

## 2019-12-17 DIAGNOSIS — Z23 Encounter for immunization: Secondary | ICD-10-CM | POA: Diagnosis not present

## 2019-12-17 LAB — CBC WITH DIFFERENTIAL/PLATELET
Basophils Absolute: 0 10*3/uL (ref 0.0–0.1)
Basophils Relative: 0.5 % (ref 0.0–3.0)
Eosinophils Absolute: 0.2 10*3/uL (ref 0.0–0.7)
Eosinophils Relative: 2.5 % (ref 0.0–5.0)
HCT: 35.6 % — ABNORMAL LOW (ref 36.0–46.0)
Hemoglobin: 11.7 g/dL — ABNORMAL LOW (ref 12.0–15.0)
Lymphocytes Relative: 30.5 % (ref 12.0–46.0)
Lymphs Abs: 2.1 10*3/uL (ref 0.7–4.0)
MCHC: 32.9 g/dL (ref 30.0–36.0)
MCV: 80.3 fl (ref 78.0–100.0)
Monocytes Absolute: 0.4 10*3/uL (ref 0.1–1.0)
Monocytes Relative: 6.1 % (ref 3.0–12.0)
Neutro Abs: 4.1 10*3/uL (ref 1.4–7.7)
Neutrophils Relative %: 60.4 % (ref 43.0–77.0)
Platelets: 291 10*3/uL (ref 150.0–400.0)
RBC: 4.43 Mil/uL (ref 3.87–5.11)
RDW: 13.5 % (ref 11.5–15.5)
WBC: 6.8 10*3/uL (ref 4.0–10.5)

## 2019-12-17 LAB — HEMOGLOBIN A1C: Hgb A1c MFr Bld: 5.1 % (ref 4.6–6.5)

## 2019-12-17 LAB — BASIC METABOLIC PANEL
BUN: 21 mg/dL (ref 6–23)
CO2: 26 mEq/L (ref 19–32)
Calcium: 9 mg/dL (ref 8.4–10.5)
Chloride: 107 mEq/L (ref 96–112)
Creatinine, Ser: 0.56 mg/dL (ref 0.40–1.20)
GFR: 117.74 mL/min (ref >60.00)
Glucose, Bld: 114 mg/dL — ABNORMAL HIGH (ref 70–99)
Potassium: 4.5 mEq/L (ref 3.5–5.1)
Sodium: 137 mEq/L (ref 135–145)

## 2019-12-17 LAB — LIPID PANEL
Cholesterol: 183 mg/dL (ref 0–200)
HDL: 54.1 mg/dL (ref >39.00)
LDL Cholesterol: 120 mg/dL — ABNORMAL HIGH (ref 0–99)
NonHDL: 129.13
Total CHOL/HDL Ratio: 3
Triglycerides: 45 mg/dL (ref 0.0–149.0)
VLDL: 9 mg/dL (ref 0.0–40.0)

## 2019-12-17 LAB — HEPATIC FUNCTION PANEL
ALT: 19 U/L (ref 0–35)
AST: 13 U/L (ref 0–37)
Albumin: 4.1 g/dL (ref 3.5–5.2)
Alkaline Phosphatase: 48 U/L (ref 39–117)
Bilirubin, Direct: 0 mg/dL (ref 0.0–0.3)
Total Bilirubin: 0.2 mg/dL (ref 0.2–1.2)
Total Protein: 6.4 g/dL (ref 6.0–8.3)

## 2019-12-17 LAB — TSH: TSH: 2.45 u[IU]/mL (ref 0.35–4.50)

## 2019-12-17 NOTE — Addendum Note (Signed)
Addended by: Caffie Pinto on: 12/17/2019 07:38 AM   Modules accepted: Orders

## 2019-12-18 LAB — MICROALBUMIN / CREATININE URINE RATIO
Creatinine, Urine: 34 mg/dL (ref 20–275)
Microalb, Ur: <0.2 mg/dL

## 2019-12-20 ENCOUNTER — Encounter: Payer: Self-pay | Admitting: General Practice

## 2019-12-21 DIAGNOSIS — F321 Major depressive disorder, single episode, moderate: Secondary | ICD-10-CM | POA: Diagnosis not present

## 2019-12-22 ENCOUNTER — Ambulatory Visit: Payer: BC Managed Care – PPO

## 2019-12-28 DIAGNOSIS — Z1231 Encounter for screening mammogram for malignant neoplasm of breast: Secondary | ICD-10-CM | POA: Diagnosis not present

## 2019-12-28 LAB — HM MAMMOGRAPHY: HM Mammogram: NORMAL (ref 0–4)

## 2020-01-05 ENCOUNTER — Other Ambulatory Visit: Payer: Self-pay | Admitting: Obstetrics and Gynecology

## 2020-01-05 DIAGNOSIS — R928 Other abnormal and inconclusive findings on diagnostic imaging of breast: Secondary | ICD-10-CM

## 2020-01-11 ENCOUNTER — Encounter: Payer: Self-pay | Admitting: Family Medicine

## 2020-01-11 ENCOUNTER — Other Ambulatory Visit: Payer: BC Managed Care – PPO

## 2020-01-11 DIAGNOSIS — F321 Major depressive disorder, single episode, moderate: Secondary | ICD-10-CM | POA: Diagnosis not present

## 2020-01-12 ENCOUNTER — Ambulatory Visit
Admission: RE | Admit: 2020-01-12 | Discharge: 2020-01-12 | Disposition: A | Discharge status: Home / Self Care | Payer: BC Managed Care – PPO | Source: Ambulatory Visit | Attending: Obstetrics and Gynecology | Admitting: Obstetrics and Gynecology

## 2020-01-12 ENCOUNTER — Other Ambulatory Visit: Payer: Self-pay

## 2020-01-12 DIAGNOSIS — N6012 Diffuse cystic mastopathy of left breast: Secondary | ICD-10-CM | POA: Diagnosis not present

## 2020-01-12 DIAGNOSIS — R928 Other abnormal and inconclusive findings on diagnostic imaging of breast: Secondary | ICD-10-CM

## 2020-07-17 ENCOUNTER — Other Ambulatory Visit: Payer: Self-pay

## 2020-07-17 ENCOUNTER — Ambulatory Visit (INDEPENDENT_AMBULATORY_CARE_PROVIDER_SITE_OTHER): Payer: 59 | Admitting: Family Medicine

## 2020-07-17 ENCOUNTER — Encounter: Payer: Self-pay | Admitting: Family Medicine

## 2020-07-17 VITALS — BP 134/84 | HR 88 | Temp 98.1°F | Resp 16 | Ht 65.0 in | Wt 223.0 lb

## 2020-07-17 DIAGNOSIS — E119 Type 2 diabetes mellitus without complications: Secondary | ICD-10-CM | POA: Diagnosis not present

## 2020-07-17 DIAGNOSIS — N751 Abscess of Bartholin's gland: Secondary | ICD-10-CM

## 2020-07-17 DIAGNOSIS — E1169 Type 2 diabetes mellitus with other specified complication: Secondary | ICD-10-CM

## 2020-07-17 DIAGNOSIS — E785 Hyperlipidemia, unspecified: Secondary | ICD-10-CM | POA: Diagnosis not present

## 2020-07-17 LAB — CBC WITH DIFFERENTIAL/PLATELET
Basophils Absolute: 0.1 10*3/uL (ref 0.0–0.1)
Basophils Relative: 0.7 % (ref 0.0–3.0)
Eosinophils Absolute: 0.2 10*3/uL (ref 0.0–0.7)
Eosinophils Relative: 2 % (ref 0.0–5.0)
HCT: 37.8 % (ref 36.0–46.0)
Hemoglobin: 12.7 g/dL (ref 12.0–15.0)
Lymphocytes Relative: 18.5 % (ref 12.0–46.0)
Lymphs Abs: 1.8 10*3/uL (ref 0.7–4.0)
MCHC: 33.6 g/dL (ref 30.0–36.0)
MCV: 79.6 fl (ref 78.0–100.0)
Monocytes Absolute: 0.5 10*3/uL (ref 0.1–1.0)
Monocytes Relative: 5.4 % (ref 3.0–12.0)
Neutro Abs: 7.1 10*3/uL (ref 1.4–7.7)
Neutrophils Relative %: 73.4 % (ref 43.0–77.0)
Platelets: 296 10*3/uL (ref 150.0–400.0)
RBC: 4.75 Mil/uL (ref 3.87–5.11)
RDW: 16.2 % — ABNORMAL HIGH (ref 11.5–15.5)
WBC: 9.7 10*3/uL (ref 4.0–10.5)

## 2020-07-17 LAB — BASIC METABOLIC PANEL
BUN: 17 mg/dL (ref 6–23)
CO2: 26 mEq/L (ref 19–32)
Calcium: 9.3 mg/dL (ref 8.4–10.5)
Chloride: 106 mEq/L (ref 96–112)
Creatinine, Ser: 0.59 mg/dL (ref 0.40–1.20)
GFR: 110.56 mL/min (ref >60.00)
Glucose, Bld: 92 mg/dL (ref 70–99)
Potassium: 4.2 mEq/L (ref 3.5–5.1)
Sodium: 141 mEq/L (ref 135–145)

## 2020-07-17 LAB — HEPATIC FUNCTION PANEL
ALT: 19 U/L (ref 0–35)
AST: 13 U/L (ref 0–37)
Albumin: 4.1 g/dL (ref 3.5–5.2)
Alkaline Phosphatase: 51 U/L (ref 39–117)
Bilirubin, Direct: 0.1 mg/dL (ref 0.0–0.3)
Total Bilirubin: 0.3 mg/dL (ref 0.2–1.2)
Total Protein: 6.7 g/dL (ref 6.0–8.3)

## 2020-07-17 LAB — TSH: TSH: 4.4 u[IU]/mL (ref 0.35–4.50)

## 2020-07-17 LAB — LIPID PANEL
Cholesterol: 176 mg/dL (ref 0–200)
HDL: 36.1 mg/dL — ABNORMAL LOW (ref >39.00)
LDL Cholesterol: 123 mg/dL — ABNORMAL HIGH (ref 0–99)
NonHDL: 140.16
Total CHOL/HDL Ratio: 5
Triglycerides: 86 mg/dL (ref 0.0–149.0)
VLDL: 17.2 mg/dL (ref 0.0–40.0)

## 2020-07-17 LAB — HEMOGLOBIN A1C: Hgb A1c MFr Bld: 5.8 % (ref 4.6–6.5)

## 2020-07-17 MED ORDER — DOXYCYCLINE HYCLATE 100 MG PO TABS
100.0000 mg | ORAL_TABLET | Freq: Two times a day (BID) | ORAL | 0 refills | Status: DC
Start: 2020-07-17 — End: 2020-12-21

## 2020-07-17 NOTE — Assessment & Plan Note (Signed)
Chronic problem.  Has been able to control w/ diet but pt recently gained 77 lbs.  UTD on microalbumin, foot exam done today.  Check A1C and adjust tx plan prn.

## 2020-07-17 NOTE — Assessment & Plan Note (Signed)
Check labs and determine whether statin is needed

## 2020-07-17 NOTE — Patient Instructions (Signed)
Schedule your complete physical in 6 months We'll notify you of your lab results and make any changes if needed START the Doxycycline twice daily- take w/ food Continue hot compresses Please schedule your eye exam at your convenience Continue to work on healthy diet and regular exercise- you can do it! If no improvement in the area by Wednesday AM, please call GYN (or let me know and we can make a referral) Call with any questions or concerns Hang in there!

## 2020-07-17 NOTE — Progress Notes (Signed)
   Subjective:    Patient ID: Dawn Downs, female    DOB: January 12, 1976, 44 y.o.   MRN: [id number]  HPI Skin infxn- first appeared 'a week or so ago'.  Thought it was a boil so taking hot baths and using warm compresses.  Area is very painful, 'under my butt cheek'.  Pt is fearful of infxn.  Pt is unaware of any drainage.    DM- chronic problem.  Was previously able to manage w/ diet and exercise after dropping 60 lbs but she has regained her weight and then some.  She is due for A1C, foot exam, eye exam.  No CP, SOB, HAs, visual changes, abd pain, N/V.  No numbness/tingling of hands/feet.  Obesity- she is up 77 lbs since last April.   Review of Systems For ROS see HPI   This visit occurred during the SARS-CoV-2 public health emergency.  Safety protocols were in place, including screening questions prior to the visit, additional usage of staff PPE, and extensive cleaning of exam room while observing appropriate contact time as indicated for disinfecting solutions.       Objective:   Physical Exam Vitals reviewed.  Constitutional:      General: She is not in acute distress.    Appearance: She is well-developed. She is obese.  HENT:     Head: Normocephalic and atraumatic.  Eyes:     Conjunctiva/sclera: Conjunctivae normal.     Pupils: Pupils are equal, round, and reactive to light.  Neck:     Thyroid: No thyromegaly.  Cardiovascular:     Rate and Rhythm: Normal rate and regular rhythm.     Heart sounds: Normal heart sounds. No murmur heard.   Pulmonary:     Effort: Pulmonary effort is normal. No respiratory distress.     Breath sounds: Normal breath sounds.  Abdominal:     General: There is no distension.     Palpations: Abdomen is soft.     Tenderness: There is no abdominal tenderness.  Genitourinary:    Comments: R bartholin cyst w/ overlying redness, central pore, and TTP Musculoskeletal:     Cervical back: Normal range of motion and neck supple.  Lymphadenopathy:      Cervical: No cervical adenopathy.  Skin:    General: Skin is warm and dry.  Neurological:     Mental Status: She is alert and oriented to person, place, and time.  Psychiatric:        Behavior: Behavior normal.           Assessment & Plan:  Bartholin's abscess- new.  Thankfully small.  Will attempt to treat w/ abx and hot compresses.  If no improvement in pain/swelling by Wednesday or worsening, will need to call GYN.  Pt expressed understanding and is in agreement w/ plan.

## 2020-07-17 NOTE — Assessment & Plan Note (Signed)
Deteriorated.  Pt is up 77 lbs since April 2020.  She had previously done very well on healthy diet, regular exercise, and weight loss.  Encouraged her to continue her previous efforts.  Check labs to risk stratify.  Will follow.

## 2020-12-21 ENCOUNTER — Other Ambulatory Visit: Payer: Self-pay

## 2020-12-21 ENCOUNTER — Encounter: Payer: Self-pay | Admitting: Family Medicine

## 2020-12-21 ENCOUNTER — Ambulatory Visit (INDEPENDENT_AMBULATORY_CARE_PROVIDER_SITE_OTHER): Payer: 59 | Admitting: Family Medicine

## 2020-12-21 VITALS — BP 118/68 | HR 89 | Temp 98.7°F | Resp 16 | Ht 64.0 in | Wt 257.4 lb

## 2020-12-21 DIAGNOSIS — Z0000 Encounter for general adult medical examination without abnormal findings: Secondary | ICD-10-CM

## 2020-12-21 DIAGNOSIS — E119 Type 2 diabetes mellitus without complications: Secondary | ICD-10-CM

## 2020-12-21 DIAGNOSIS — Z23 Encounter for immunization: Secondary | ICD-10-CM

## 2020-12-21 DIAGNOSIS — F32A Depression, unspecified: Secondary | ICD-10-CM | POA: Diagnosis not present

## 2020-12-21 MED ORDER — BUPROPION HCL ER (XL) 150 MG PO TB24: 150.0000 mg | ORAL_TABLET | Freq: Every day | ORAL | 3 refills | Status: DC

## 2020-12-21 NOTE — Progress Notes (Signed)
   Subjective:    Patient ID: Dawn Downs, female    DOB: 07-28-1976, 44 y.o.   MRN: [id number]  HPI CPE- UTD on Tdap, COVID.  Will get flu shot today.  UTD on pap.  Due for eye exam.  Mammo is scheduled  Reviewed past medical, surgical, family and social histories.   Patient Care Team    Relationship Specialty Notifications Start End  Sheliah Hatch, MD PCP - General Family Medicine  11/14/17   Huel Cote, MD Consulting Physician Obstetrics and Gynecology  12/14/19     Health Maintenance  Topic Date Due  . INFLUENZA VACCINE  07/30/2020  . URINE MICROALBUMIN  12/16/2020  . OPHTHALMOLOGY EXAM  04/10/2021 (Originally 02/26/2020)  . PNEUMOCOCCAL POLYSACCHARIDE VACCINE AGE 44-64 HIGH RISK  12/21/2021 (Originally 03/18/1978)  . Hepatitis C Screening  12/21/2021 (Originally 11-06-1976)  . HIV Screening  12/21/2021 (Originally 03/19/1991)  . HEMOGLOBIN A1C  01/17/2021  . FOOT EXAM  07/17/2021  . PAP SMEAR-Modifier  10/08/2021  . TETANUS/TDAP  07/03/2025  . COVID-19 Vaccine  Completed      Review of Systems Patient reports no vision/ hearing changes, adenopathy,fever, persistant/recurrent hoarseness , swallowing issues, chest pain, palpitations, edema, persistant/recurrent cough, hemoptysis, dyspnea (rest/exertional/paroxysmal nocturnal), gastrointestinal bleeding (melena, rectal bleeding), abdominal pain, significant heartburn, bowel changes, GU symptoms (dysuria, hematuria, incontinence), Gyn symptoms (abnormal  bleeding, pain),  syncope, focal weakness, memory loss, numbness & tingling, skin/hair/nail changes, abnormal bruising or bleeding.   + 35 lb weight gain- my diet has been 'garbage' + depression- seeing a counselor q3 weeks but feels she needs to start medication.  'i'm just in a funk'.  Pt is tearful.  This visit occurred during the SARS-CoV-2 public health emergency.  Safety protocols were in place, including screening questions prior to the visit, additional usage  of staff PPE, and extensive cleaning of exam room while observing appropriate contact time as indicated for disinfecting solutions.       Objective:   Physical Exam General Appearance:    Alert, cooperative, no distress, appears stated age, obese  Head:    Normocephalic, without obvious abnormality, atraumatic  Eyes:    PERRL, conjunctiva/corneas clear, EOM's intact, fundi    benign, both eyes  Ears:    Normal TM's and external ear canals, both ears  Nose:   Deferred due to COVID  Throat:   Neck:   Supple, symmetrical, trachea midline, no adenopathy;    Thyroid: no enlargement/tenderness/nodules  Back:     Symmetric, no curvature, ROM normal, no CVA tenderness  Lungs:     Clear to auscultation bilaterally, respirations unlabored  Chest Wall:    No tenderness or deformity   Heart:    Regular rate and rhythm, S1 and S2 normal, no murmur, rub   or gallop  Breast Exam:    Deferred to GYN  Abdomen:     Soft, non-tender, bowel sounds active all four quadrants,    no masses, no organomegaly  Genitalia:    Deferred to GYN  Rectal:    Extremities:   Extremities normal, atraumatic, no cyanosis or edema  Pulses:   2+ and symmetric all extremities  Skin:   Skin color, texture, turgor normal, no rashes or lesions  Lymph nodes:   Cervical, supraclavicular, and axillary nodes normal  Neurologic:   CNII-XII intact, normal strength, sensation and reflexes    throughout          Assessment & Plan:

## 2020-12-21 NOTE — Patient Instructions (Addendum)
Follow up in 4-6 weeks to recheck mood We'll notify you of your lab results and make any changes if needed Start the Wellbutrin once daily Your journey starts now!  You can do it! Focus on healthy diet and regular exercise!  You can do it! Call with any questions or concerns Stay Safe!  Stay Healthy! Happy Holidays!

## 2020-12-21 NOTE — Assessment & Plan Note (Signed)
Likely deteriorated as pt has had 35 lb weight gain.  She admits to eating 'like garbage'.  Due for eye exam- pt to schedule.  Will get microalbumin.  UTD on foot exam.  Check labs and start meds as needed.

## 2020-12-21 NOTE — Assessment & Plan Note (Signed)
New.  Pt has had a very hard year and feels at this time she needs medication to help things improve.  I applauded her ability to ask for help.  Since she wants to start losing weight, will start Wellbutrin and monitor closely for improvement.

## 2020-12-21 NOTE — Assessment & Plan Note (Signed)
Pt's PE WNL w/ exception of obesity.  UTD on mammo, pap, COVID, Tdap.  Flu shot today.  Check labs.  Anticipatory guidance provided.

## 2020-12-25 ENCOUNTER — Other Ambulatory Visit: Payer: Self-pay

## 2020-12-25 DIAGNOSIS — E119 Type 2 diabetes mellitus without complications: Secondary | ICD-10-CM

## 2020-12-25 LAB — HEPATIC FUNCTION PANEL
AG Ratio: 1.7 (calc) (ref 1.0–2.5)
ALT: 20 U/L (ref 6–29)
AST: 13 U/L (ref 10–30)
Albumin: 4 g/dL (ref 3.6–5.1)
Alkaline phosphatase (APISO): 65 U/L (ref 31–125)
Bilirubin, Direct: 0.1 mg/dL (ref 0.0–0.2)
Globulin: 2.3 g/dL (calc) (ref 1.9–3.7)
Indirect Bilirubin: 0.3 mg/dL (calc) (ref 0.2–1.2)
Total Bilirubin: 0.4 mg/dL (ref 0.2–1.2)
Total Protein: 6.3 g/dL (ref 6.1–8.1)

## 2020-12-25 LAB — CBC WITH DIFFERENTIAL/PLATELET
Absolute Monocytes: 572 cells/uL (ref 200–950)
Basophils Absolute: 43 cells/uL (ref 0–200)
Basophils Relative: 0.4 %
Eosinophils Absolute: 205 cells/uL (ref 15–500)
Eosinophils Relative: 1.9 %
HCT: 38.3 % (ref 35.0–45.0)
Hemoglobin: 13 g/dL (ref 11.7–15.5)
Lymphs Abs: 1825 cells/uL (ref 850–3900)
MCH: 26.5 pg — ABNORMAL LOW (ref 27.0–33.0)
MCHC: 33.9 g/dL (ref 32.0–36.0)
MCV: 78 fL — ABNORMAL LOW (ref 80.0–100.0)
MPV: 9.6 fL (ref 7.5–12.5)
Monocytes Relative: 5.3 %
Neutro Abs: 8154 cells/uL — ABNORMAL HIGH (ref 1500–7800)
Neutrophils Relative %: 75.5 %
Platelets: 305 10*3/uL (ref 140–400)
RBC: 4.91 10*6/uL (ref 3.80–5.10)
RDW: 14.3 % (ref 11.0–15.0)
Total Lymphocyte: 16.9 %
WBC: 10.8 10*3/uL (ref 3.8–10.8)

## 2020-12-25 LAB — MICROALBUMIN / CREATININE URINE RATIO

## 2020-12-25 LAB — BASIC METABOLIC PANEL
BUN: 14 mg/dL (ref 7–25)
CO2: 25 mmol/L (ref 20–32)
Calcium: 9.2 mg/dL (ref 8.6–10.2)
Chloride: 102 mmol/L (ref 98–110)
Creat: 0.53 mg/dL (ref 0.50–1.10)
Glucose, Bld: 140 mg/dL — ABNORMAL HIGH (ref 65–99)
Potassium: 4.4 mmol/L (ref 3.5–5.3)
Sodium: 136 mmol/L (ref 135–146)

## 2020-12-25 LAB — HEMOGLOBIN A1C
Hgb A1c MFr Bld: 6.4 % of total Hgb — ABNORMAL HIGH (ref <5.7)
Mean Plasma Glucose: 137 mg/dL
eAG (mmol/L): 7.6 mmol/L

## 2020-12-25 LAB — LIPID PANEL
Cholesterol: 186 mg/dL (ref <200)
HDL: 39 mg/dL — ABNORMAL LOW (ref >=50)
LDL Cholesterol (Calc): 127 mg/dL (calc) — ABNORMAL HIGH
Non-HDL Cholesterol (Calc): 147 mg/dL (calc) — ABNORMAL HIGH (ref <130)
Total CHOL/HDL Ratio: 4.8 (calc) (ref <5.0)
Triglycerides: 95 mg/dL (ref <150)

## 2020-12-25 LAB — TSH: TSH: 3.04 mIU/L

## 2020-12-26 ENCOUNTER — Ambulatory Visit: Payer: 59

## 2021-01-12 ENCOUNTER — Other Ambulatory Visit: Payer: Self-pay | Admitting: Family Medicine

## 2021-01-23 ENCOUNTER — Other Ambulatory Visit: Payer: Self-pay | Admitting: Obstetrics and Gynecology

## 2021-01-23 DIAGNOSIS — Z1231 Encounter for screening mammogram for malignant neoplasm of breast: Secondary | ICD-10-CM

## 2021-02-02 ENCOUNTER — Encounter: Payer: Self-pay | Admitting: Family Medicine

## 2021-02-04 MED ORDER — BUPROPION HCL ER (XL) 300 MG PO TB24: 300.0000 mg | ORAL_TABLET | Freq: Every day | ORAL | 1 refills | Status: DC

## 2021-02-14 ENCOUNTER — Ambulatory Visit: Payer: 59

## 2021-06-26 ENCOUNTER — Encounter: Payer: Self-pay | Admitting: Registered Nurse

## 2021-06-26 ENCOUNTER — Other Ambulatory Visit: Payer: Self-pay

## 2021-06-26 ENCOUNTER — Ambulatory Visit: Payer: Commercial Managed Care - PPO | Admitting: Registered Nurse

## 2021-06-26 VITALS — BP 124/74 | HR 70 | Temp 98.3°F | Resp 18 | Ht 64.0 in | Wt 263.0 lb

## 2021-06-26 DIAGNOSIS — B373 Candidiasis of vulva and vagina: Secondary | ICD-10-CM | POA: Diagnosis not present

## 2021-06-26 DIAGNOSIS — R350 Frequency of micturition: Secondary | ICD-10-CM | POA: Diagnosis not present

## 2021-06-26 DIAGNOSIS — N3000 Acute cystitis without hematuria: Secondary | ICD-10-CM | POA: Diagnosis not present

## 2021-06-26 DIAGNOSIS — B3731 Acute candidiasis of vulva and vagina: Secondary | ICD-10-CM

## 2021-06-26 LAB — POCT URINALYSIS DIPSTICK
Bilirubin, UA: NEGATIVE
Glucose, UA: POSITIVE — AB
Ketones, UA: NEGATIVE
Protein, UA: POSITIVE — AB
Spec Grav, UA: >=1.03 — AB (ref 1.010–1.025)
Urobilinogen, UA: 0.2 E.U./dL
pH, UA: 5 (ref 5.0–8.0)

## 2021-06-26 MED ORDER — SULFAMETHOXAZOLE-TRIMETHOPRIM 800-160 MG PO TABS: 1.0000 | ORAL_TABLET | Freq: Two times a day (BID) | ORAL | 0 refills | Status: DC

## 2021-06-26 MED ORDER — FLUCONAZOLE 150 MG PO TABS: 150.0000 mg | ORAL_TABLET | Freq: Once | ORAL | 0 refills | Status: AC

## 2021-06-26 NOTE — Addendum Note (Signed)
Addended by: Janeece Agee on: 06/26/2021 01:29 PM   Modules accepted: Orders

## 2021-06-26 NOTE — Patient Instructions (Addendum)
Ms. Reaves -  Nitrites on urine dip indicative of infection. Will treat with Bactrim DS by mouth twice daily for three days  If symptoms persist or worsen let me know  If sharp flank pain, fevers, chills, sweats, or other systemic symptoms arise, seek in person eval at ER or Urgent Care. May warrant IV antibiotics.  I'll be in contact with culture results  Thank you  Rich   If you have lab work done today you will be contacted with your lab results within the next 2 weeks.  If you have not heard from [place] then please contact us. The fastest way to get your results is to register for My Chart.   IF you received an x-ray today, you will receive an invoice from [facility]. Please contact [facility] at [phone] with questions or concerns regarding your invoice.   IF you received labwork today, you will receive an invoice from [facility]. Please contact [facility] at [phone] with questions or concerns regarding your invoice.   Our billing staff will not be able to assist you with questions regarding bills from these companies.  You will be contacted with the lab results as soon as they are available. The fastest way to get your results is to activate your My Chart account. Instructions are located on the last page of this paperwork. If you have not heard from [place] regarding the results in 2 weeks, please contact this office.

## 2021-06-26 NOTE — Progress Notes (Signed)
Acute Office Visit  Subjective:    Patient ID: Dawn Downs, female    DOB: 11-03-1976, 45 y.o.   MRN: [id number]  Chief Complaint  Patient presents with   Urinary Tract Infection    Patient states she is here for an UTI . She is having some pressure and urine frequency since last night.    HPI Patient is in today for UTI.   Onset last night Suprapubic pressure, urine frequency and urgency No gross hematuria No flank pain  No systemic symptoms - perhaps mildly feverish. No vaginal symptoms.  Has been hydrating  Some effect   Past Medical History:  Diagnosis Date   Diabetes mellitus without complication (HCC)    GERD (gastroesophageal reflux disease)    Hyperlipidemia    Hypertension    Pancreatitis due to common bile duct stone    UTI (urinary tract infection)     Past Surgical History:  Procedure Laterality Date   CHOLECYSTECTOMY     OVARIAN CYST REMOVAL      Family History  Problem Relation Age of Onset   Hypertension Mother    Diabetes Father    Gestational diabetes Sister    Cancer Maternal Grandmother        lung   Heart disease Maternal Grandmother    Hypertension Maternal Grandmother    Alcohol abuse Maternal Grandfather    Heart disease Maternal Grandfather    Heart attack Maternal Grandfather    Diabetes Paternal Grandmother    Heart disease Paternal Grandmother    Hearing loss Paternal Grandfather    Heart attack Paternal Grandfather     Social History   Socioeconomic History   Marital status: Single    Spouse name: Not on file   Number of children: Not on file   Years of education: Not on file   Highest education level: Not on file  Occupational History   Not on file  Tobacco Use   Smoking status: Never   Smokeless tobacco: Never  Vaping Use   Vaping Use: Never used  Substance and Sexual Activity   Alcohol use: Yes   Drug use: No   Sexual activity: Never  Other Topics Concern   Not on file  Social History Narrative    Not on file   Social Determinants of Health   Financial Resource Strain: Not on file  Food Insecurity: Not on file  Transportation Needs: Not on file  Physical Activity: Not on file  Stress: Not on file  Social Connections: Not on file  Intimate Partner Violence: Not on file    Outpatient Medications Prior to Visit  Medication Sig Dispense Refill   buPROPion (WELLBUTRIN XL) 300 MG 24 hr tablet Take 1 tablet (300 mg total) by mouth daily. 90 tablet 1   norethindrone-ethinyl estradiol (JUNEL FE,GILDESS FE,LOESTRIN FE) 1-20 MG-MCG tablet Take 1 tablet by mouth daily.     No facility-administered medications prior to visit.    Not on File  Review of Systems  Constitutional: Negative.   HENT: Negative.    Eyes: Negative.   Respiratory: Negative.    Cardiovascular: Negative.   Gastrointestinal: Negative.   Genitourinary: Negative.   Musculoskeletal: Negative.   Skin: Negative.   Neurological: Negative.   Psychiatric/Behavioral: Negative.    All other systems reviewed and are negative.     Objective:    Physical Exam Vitals and nursing note reviewed.  Constitutional:      General: She is not in acute distress.  Appearance: Normal appearance. She is not ill-appearing, toxic-appearing or diaphoretic.  Cardiovascular:     Rate and Rhythm: Normal rate and regular rhythm.     Pulses: Normal pulses.     Heart sounds: Normal heart sounds. No murmur heard.   No friction rub. No gallop.  Pulmonary:     Effort: Pulmonary effort is normal. No respiratory distress.     Breath sounds: Normal breath sounds. No stridor. No wheezing, rhonchi or rales.  Chest:     Chest wall: No tenderness.  Skin:    General: Skin is warm and dry.     Capillary Refill: Capillary refill takes less than 2 seconds.  Neurological:     General: No focal deficit present.     Mental Status: She is alert and oriented to person, place, and time. Mental status is at baseline.  Psychiatric:        Mood  and Affect: Mood normal.        Behavior: Behavior normal.        Thought Content: Thought content normal.        Judgment: Judgment normal.    BP 124/74   Pulse 70   Temp 98.3 F (36.8 C) (Temporal)   Resp 18   Ht 5\' 4"  (1.626 m)   Wt 263 lb (119.3 kg)   SpO2 97%   BMI 45.14 kg/m  Wt Readings from Last 3 Encounters:  06/26/21 263 lb (119.3 kg)  12/21/20 257 lb 6.4 oz (116.8 kg)  07/17/20 223 lb (101.2 kg)    Health Maintenance Due  Topic Date Due   HEMOGLOBIN A1C  06/21/2021    There are no preventive care reminders to display for this patient.   Lab Results  Component Value Date   TSH 3.04 12/21/2020   Lab Results  Component Value Date   WBC 10.8 12/21/2020   HGB 13.0 12/21/2020   HCT 38.3 12/21/2020   MCV 78.0 (L) 12/21/2020   PLT 305 12/21/2020   Lab Results  Component Value Date   NA 136 12/21/2020   K 4.4 12/21/2020   CO2 25 12/21/2020   GLUCOSE 140 (H) 12/21/2020   BUN 14 12/21/2020   CREATININE 0.53 12/21/2020   BILITOT 0.4 12/21/2020   ALKPHOS 51 07/17/2020   AST 13 12/21/2020   ALT 20 12/21/2020   PROT 6.3 12/21/2020   ALBUMIN 4.1 07/17/2020   CALCIUM 9.2 12/21/2020   GFR 110.56 07/17/2020   Lab Results  Component Value Date   CHOL 186 12/21/2020   Lab Results  Component Value Date   HDL 39 (L) 12/21/2020   Lab Results  Component Value Date   LDLCALC 127 (H) 12/21/2020   Lab Results  Component Value Date   TRIG 95 12/21/2020   Lab Results  Component Value Date   CHOLHDL 4.8 12/21/2020   Lab Results  Component Value Date   HGBA1C 6.4 (H) 12/21/2020       Assessment & Plan:   Problem List Items Addressed This Visit   None Visit Diagnoses     Urine frequency    -  Primary   Relevant Orders   POCT Urinalysis Dipstick (Completed)   Urine Culture   Acute cystitis without hematuria       Relevant Medications   sulfamethoxazole-trimethoprim (BACTRIM DS) 800-160 MG tablet   Other Relevant Orders   Urine Culture         Meds ordered this encounter  Medications   sulfamethoxazole-trimethoprim (BACTRIM DS) 800-160  MG tablet    Sig: Take 1 tablet by mouth 2 (two) times daily.    Dispense:  6 tablet    Refill:  0    Order Specific Question:   Supervising Provider    Answer:   Neva Seat, JEFFREY R [[id number]]   PLAN Nitrites in urine. Will treat and send culture Bactrim po bid for three days ER precautions reviewed, pt voices understanding Patient encouraged to call clinic with any questions, comments, or concerns.   Janeece Agee, NP

## 2021-06-27 ENCOUNTER — Encounter: Payer: Self-pay | Admitting: *Deleted

## 2021-06-28 LAB — URINE CULTURE
MICRO NUMBER:: 12059782
SPECIMEN QUALITY:: ADEQUATE

## 2021-08-30 ENCOUNTER — Ambulatory Visit: Payer: Commercial Managed Care - PPO | Admitting: Family

## 2021-08-30 ENCOUNTER — Other Ambulatory Visit: Payer: Self-pay

## 2021-08-30 ENCOUNTER — Encounter: Payer: Self-pay | Admitting: Family

## 2021-08-30 VITALS — BP 154/80 | HR 123 | Temp 100.1°F | Ht 64.0 in | Wt 264.8 lb

## 2021-08-30 DIAGNOSIS — E119 Type 2 diabetes mellitus without complications: Secondary | ICD-10-CM

## 2021-08-30 DIAGNOSIS — R300 Dysuria: Secondary | ICD-10-CM

## 2021-08-30 DIAGNOSIS — R509 Fever, unspecified: Secondary | ICD-10-CM | POA: Diagnosis not present

## 2021-08-30 LAB — POCT URINALYSIS DIP (CLINITEK)
Bilirubin, UA: NEGATIVE
Glucose, UA: >=1000 mg/dL — AB
Leukocytes, UA: NEGATIVE
Nitrite, UA: NEGATIVE
POC PROTEIN,UA: NEGATIVE
Spec Grav, UA: 1.01 (ref 1.010–1.025)
Urobilinogen, UA: 0.2 E.U./dL
pH, UA: 6 (ref 5.0–8.0)

## 2021-08-30 MED ORDER — NITROFURANTOIN MONOHYD MACRO 100 MG PO CAPS: 100.0000 mg | ORAL_CAPSULE | Freq: Two times a day (BID) | ORAL | 0 refills | Status: DC

## 2021-08-30 MED ORDER — CEFTRIAXONE SODIUM 500 MG IJ SOLR
500.0000 mg | Freq: Once | INTRAMUSCULAR | Status: AC
  Administered 2021-08-30: 500 mg via INTRAMUSCULAR

## 2021-08-30 NOTE — Progress Notes (Signed)
Dawn Downs is a 45 y.o. female with the following history as recorded in EpicCare:  Patient Active Problem List   Diagnosis Date Noted   Depression 12/21/2020   Physical exam 12/14/2019   Hyperlipidemia associated with type 2 diabetes mellitus ([place]) 12/15/2017   Diet-controlled diabetes mellitus ([place]) 11/14/2017   HTN (hypertension) 11/14/2017   Morbidly obese ([place]) 11/14/2017   PCOS (polycystic ovarian syndrome) 05/11/2015   Endometriosis 11/08/2008    Current Outpatient Medications  Medication Sig Dispense Refill   buPROPion (WELLBUTRIN XL) 300 MG 24 hr tablet Take 1 tablet (300 mg total) by mouth daily. 90 tablet 1   nitrofurantoin, macrocrystal-monohydrate, (MACROBID) 100 MG capsule Take 1 capsule (100 mg total) by mouth 2 (two) times daily. 14 capsule 0   norethindrone-ethinyl estradiol (JUNEL FE,GILDESS FE,LOESTRIN FE) 1-20 MG-MCG tablet Take 1 tablet by mouth daily.     No current facility-administered medications for this visit.    Allergies: Patient has no known allergies.  Past Medical History:  Diagnosis Date   Diabetes mellitus without complication (HCC)    GERD (gastroesophageal reflux disease)    Hyperlipidemia    Hypertension    Pancreatitis due to common bile duct stone    UTI (urinary tract infection)     Past Surgical History:  Procedure Laterality Date   CHOLECYSTECTOMY     OVARIAN CYST REMOVAL      Family History  Problem Relation Age of Onset   Hypertension Mother    Diabetes Father    Gestational diabetes Sister    Cancer Maternal Grandmother        lung   Heart disease Maternal Grandmother    Hypertension Maternal Grandmother    Alcohol abuse Maternal Grandfather    Heart disease Maternal Grandfather    Heart attack Maternal Grandfather    Diabetes Paternal Grandmother    Heart disease Paternal Grandmother    Hearing loss Paternal Grandfather    Heart attack Paternal Grandfather     Social History   Tobacco Use   Smoking status:  Never   Smokeless tobacco: Never  Substance Use Topics   Alcohol use: Yes    Subjective:  1 week history of burning and urinary frequency; started last Thursday and symptoms have persisted; initially had some back pain which has now resolved; no blood in urine; admits to feeling dizzy, disoriented as well; has had kidney stone in the past but notes these symptoms feel different;  Does have history of Type 2 Diabetes- was able to discontinue medication with weight loss; weight has come back and she is actually planning to see her PCP in a few weeks to discuss diabetes management.      Objective:  Vitals:   08/30/21 1310  BP: (!) 154/80  Pulse: (!) 123  Temp: 100.1 F (37.8 C)  TempSrc: Oral  SpO2: 99%  Weight: 264 lb 12.8 oz (120.1 kg)  Height: 5' 4"  (1.626 m)    General: Well developed, well nourished, in no acute distress  Skin : Warm and dry.  Head: Normocephalic and atraumatic  Eyes: Sclera and conjunctiva clear; pupils round and reactive to light; extraocular movements intact  Ears: External normal; canals clear; tympanic membranes normal  Oropharynx: Pink, supple. No suspicious lesions  Neck: Supple without thyromegaly, adenopathy  Lungs: Respirations unlabored; clear to auscultation bilaterally without wheeze, rales, rhonchi  CVS exam: normal rate and regular rhythm.  Musculoskeletal: No deformities; no active joint inflammation  Extremities: No edema, cyanosis, clubbing  Vessels: Symmetric  bilaterally  Neurologic: Alert and oriented; speech intact; face symmetrical; moves all extremities well; CNII-XII intact without focal deficit   Assessment:  1. Fever, unspecified fever cause   2. Dysuria   3. Type 2 diabetes mellitus without complication, without long-term current use of insulin ([place])     Plan:  & 2. Suspect UTI- early pyelonephritis; check U/A and urine culture; patient is given Rocephin IM 500 mg in office; she will star Macrobid 100 mg bid x 7 days tomorrow;  will check on patient tomorrow; 3.   Glucose was noticed in urine at last U/A in June; known history of type 2 diabetes; check Hgba1c and glucose today; follow up to be determined;  This visit occurred during the SARS-CoV-2 public health emergency.  Safety protocols were in place, including screening questions prior to the visit, additional usage of staff PPE, and extensive cleaning of exam room while observing appropriate contact time as indicated for disinfecting solutions.    No follow-ups on file.  Orders Placed This Encounter  Procedures   Urine Culture   CBC with Differential/Platelet   Comp Met (CMET)   Urinalysis   Urinalysis, Routine w reflex microscopic   POCT URINALYSIS DIP (CLINITEK)    Requested Prescriptions   Signed Prescriptions Disp Refills   nitrofurantoin, macrocrystal-monohydrate, (MACROBID) 100 MG capsule 14 capsule 0    Sig: Take 1 capsule (100 mg total) by mouth 2 (two) times daily.

## 2021-08-31 ENCOUNTER — Other Ambulatory Visit (INDEPENDENT_AMBULATORY_CARE_PROVIDER_SITE_OTHER): Payer: Commercial Managed Care - PPO

## 2021-08-31 ENCOUNTER — Other Ambulatory Visit: Payer: Self-pay | Admitting: Family

## 2021-08-31 DIAGNOSIS — E119 Type 2 diabetes mellitus without complications: Secondary | ICD-10-CM

## 2021-08-31 LAB — CBC WITH DIFFERENTIAL/PLATELET
Basophils Absolute: 0 10*3/uL (ref 0.0–0.1)
Basophils Relative: 0.4 % (ref 0.0–3.0)
Eosinophils Absolute: 0 10*3/uL (ref 0.0–0.7)
Eosinophils Relative: 0 % (ref 0.0–5.0)
HCT: 39.2 % (ref 36.0–46.0)
Hemoglobin: 12.9 g/dL (ref 12.0–15.0)
Lymphocytes Relative: 5.8 % — ABNORMAL LOW (ref 12.0–46.0)
Lymphs Abs: 0.4 10*3/uL — ABNORMAL LOW (ref 0.7–4.0)
MCHC: 32.9 g/dL (ref 30.0–36.0)
MCV: 78.4 fl (ref 78.0–100.0)
Monocytes Absolute: 0.4 10*3/uL (ref 0.1–1.0)
Monocytes Relative: 6 % (ref 3.0–12.0)
Neutro Abs: 5.4 10*3/uL (ref 1.4–7.7)
Neutrophils Relative %: 87.8 % — ABNORMAL HIGH (ref 43.0–77.0)
Platelets: 207 10*3/uL (ref 150.0–400.0)
RBC: 5 Mil/uL (ref 3.87–5.11)
RDW: 15.8 % — ABNORMAL HIGH (ref 11.5–15.5)
WBC: 6.2 10*3/uL (ref 4.0–10.5)

## 2021-08-31 LAB — COMPREHENSIVE METABOLIC PANEL
ALT: 39 U/L — ABNORMAL HIGH (ref 0–35)
AST: 25 U/L (ref 0–37)
Albumin: 3.7 g/dL (ref 3.5–5.2)
Alkaline Phosphatase: 72 U/L (ref 39–117)
BUN: 15 mg/dL (ref 6–23)
CO2: 23 mEq/L (ref 19–32)
Calcium: 8.9 mg/dL (ref 8.4–10.5)
Chloride: 97 mEq/L (ref 96–112)
Creatinine, Ser: 0.76 mg/dL (ref 0.40–1.20)
GFR: 94.61 mL/min (ref >60.00)
Glucose, Bld: 429 mg/dL — ABNORMAL HIGH (ref 70–99)
Potassium: 4 mEq/L (ref 3.5–5.1)
Sodium: 133 mEq/L — ABNORMAL LOW (ref 135–145)
Total Bilirubin: 0.5 mg/dL (ref 0.2–1.2)
Total Protein: 6.1 g/dL (ref 6.0–8.3)

## 2021-08-31 LAB — URINALYSIS, ROUTINE W REFLEX MICROSCOPIC
Bilirubin Urine: NEGATIVE
Nitrite: POSITIVE — AB
Specific Gravity, Urine: 1.01 (ref 1.000–1.030)
Total Protein, Urine: NEGATIVE
Urine Glucose: >=1000 — AB
Urobilinogen, UA: 0.2 (ref 0.0–1.0)
pH: 6 (ref 5.0–8.0)

## 2021-08-31 LAB — HEMOGLOBIN A1C: Hgb A1c MFr Bld: 10.3 % — ABNORMAL HIGH (ref 4.6–6.5)

## 2021-08-31 MED ORDER — METFORMIN HCL 500 MG PO TABS: ORAL_TABLET | ORAL | 0 refills | Status: DC

## 2021-09-02 LAB — URINE CULTURE
MICRO NUMBER:: 12321834
SPECIMEN QUALITY:: ADEQUATE

## 2021-09-04 ENCOUNTER — Encounter: Payer: Self-pay | Admitting: Family

## 2021-09-04 ENCOUNTER — Other Ambulatory Visit: Payer: Self-pay

## 2021-09-04 ENCOUNTER — Ambulatory Visit (INDEPENDENT_AMBULATORY_CARE_PROVIDER_SITE_OTHER): Payer: Commercial Managed Care - PPO | Admitting: Family

## 2021-09-04 VITALS — BP 136/78 | HR 99 | Temp 98.0°F | Ht 64.0 in | Wt 262.0 lb

## 2021-09-04 DIAGNOSIS — N390 Urinary tract infection, site not specified: Secondary | ICD-10-CM | POA: Diagnosis not present

## 2021-09-04 DIAGNOSIS — B372 Candidiasis of skin and nail: Secondary | ICD-10-CM | POA: Diagnosis not present

## 2021-09-04 DIAGNOSIS — R7309 Other abnormal glucose: Secondary | ICD-10-CM

## 2021-09-04 LAB — BASIC METABOLIC PANEL
BUN: 9 mg/dL (ref 6–23)
CO2: 27 mEq/L (ref 19–32)
Calcium: 9 mg/dL (ref 8.4–10.5)
Chloride: 100 mEq/L (ref 96–112)
Creatinine, Ser: 0.59 mg/dL (ref 0.40–1.20)
GFR: 108.81 mL/min (ref >60.00)
Glucose, Bld: 245 mg/dL — ABNORMAL HIGH (ref 70–99)
Potassium: 3.7 mEq/L (ref 3.5–5.1)
Sodium: 138 mEq/L (ref 135–145)

## 2021-09-04 MED ORDER — FLUCONAZOLE 100 MG PO TABS: 100.0000 mg | ORAL_TABLET | Freq: Every day | ORAL | 0 refills | Status: DC

## 2021-09-04 MED ORDER — NYSTATIN-TRIAMCINOLONE 100000-0.1 UNIT/GM-% EX CREA: 1.0000 "application " | TOPICAL_CREAM | Freq: Two times a day (BID) | CUTANEOUS | 0 refills | Status: DC

## 2021-09-04 NOTE — Patient Instructions (Signed)
Intertrigo °Intertrigo is skin irritation (inflammation) that happens in warm, moist areas of the body. The irritation can cause a rash and make skin raw and itchy. The rash is usually pink or red. It happens mostly between folds of skin or where skin rubs together, such as: °Between the toes. °In the armpits. °In the groin area. °Under the belly. °Under the breasts. °Around the butt area. °This condition is not passed from person to person (is not contagious). °What are the causes? °Heat, moisture, rubbing, and not enough air movement. °The condition can be made worse by: °Sweat. °Bacteria. °A fungus, such as yeast. °What increases the risk? °Moisture in your skin folds. °You are more likely to develop this condition if you: °Have diabetes. °Are overweight. °Are not able to move around. °Live in a warm and moist climate. °Wear splints, braces, or other medical devices. °Are not able to control your pee (urine) or poop (stool). °What are the signs or symptoms? °A pink or red skin rash in the skin fold or near the skin fold. °Raw or scaly skin. °Itching. °A burning feeling. °Bleeding. °Leaking fluid. °A bad smell. °How is this treated? °Cleaning and drying your skin. °Taking an antibiotic medicine or using an antibiotic skin cream for a bacterial infection. °Using an antifungal cream on your skin or taking pills for an infection that was caused by a fungus, such as yeast. °Using a steroid ointment to stop the itching and irritation. °Separating the skin fold with a clean cotton cloth to absorb moisture and allow air to flow into the area. °Follow these instructions at home: °Keep the affected area clean and dry. °Do not scratch your skin. °Stay cool as much as you can. Use an air conditioner or a fan, if you have one. °Apply over-the-counter and prescription medicines only as told by your doctor. °If you were prescribed an antibiotic medicine, use it as told by your doctor. Do not stop using the antibiotic even if  your condition starts to get better. °Keep all follow-up visits as told by your doctor. This is important. °How is this prevented? ° °Stay at a healthy weight. °Take care of your feet. This is very important if you have diabetes. You should: °Wear shoes that fit well. °Keep your feet dry. °Wear clean cotton or wool socks. °Protect the skin in your groin and butt area as told by your doctor. To do this: °Follow a regular cleaning routine. °Use creams, powders, or ointments that protect your skin. °Change protection pads often. °Do not wear tight clothes. Wear clothes that: °Are loose. °Take moisture away from your body. °Are made of cotton. °Wear a bra that gives good support, if needed. °Shower and dry yourself well after being active. Use a hair dryer on a cool setting to dry between skin folds. °Keep your blood sugar under control if you have diabetes. °Contact a doctor if: °Your symptoms do not get better with treatment. °Your symptoms get worse or they spread. °You notice more redness and warmth. °You have a fever. °Summary °Intertrigo is skin irritation that occurs when folds of skin rub together. °This condition is caused by heat, moisture, and rubbing. °This condition may be treated by cleaning and drying your skin and with medicines. °Apply over-the-counter and prescription medicines only as told by your doctor. °Keep all follow-up visits as told by your doctor. This is important. °This information is not intended to replace advice given to you by your health care provider. Make sure you discuss   any questions you have with your health care provider. °Document Revised: 09/24/2018 Document Reviewed: 09/24/2018 °Elsevier Patient Education © 2022 [employer] ° °

## 2021-09-04 NOTE — Progress Notes (Signed)
Dawn Downs is a 45 y.o. female with the following history as recorded in EpicCare:  Patient Active Problem List   Diagnosis Date Noted   Depression 12/21/2020   Physical exam 12/14/2019   Hyperlipidemia associated with type 2 diabetes mellitus (HCC) 12/15/2017   Diet-controlled diabetes mellitus (HCC) 11/14/2017   HTN (hypertension) 11/14/2017   Morbidly obese (HCC) 11/14/2017   PCOS (polycystic ovarian syndrome) 05/11/2015   Endometriosis 11/08/2008    Current Outpatient Medications  Medication Sig Dispense Refill   buPROPion (WELLBUTRIN XL) 300 MG 24 hr tablet Take 1 tablet (300 mg total) by mouth daily. 90 tablet 1   fluconazole (DIFLUCAN) 100 MG tablet Take 1 tablet (100 mg total) by mouth daily. 7 tablet 0   metFORMIN (GLUCOPHAGE) 500 MG tablet Take 1 po bid x 5 days; then increase to 2 po bid as directed 120 tablet 0   nitrofurantoin, macrocrystal-monohydrate, (MACROBID) 100 MG capsule Take 1 capsule (100 mg total) by mouth 2 (two) times daily. 14 capsule 0   norethindrone-ethinyl estradiol (JUNEL FE,GILDESS FE,LOESTRIN FE) 1-20 MG-MCG tablet Take 1 tablet by mouth daily.     nystatin-triamcinolone (MYCOLOG II) cream Apply 1 application topically 2 (two) times daily. 60 g 0   No current facility-administered medications for this visit.    Allergies: Patient has no known allergies.  Past Medical History:  Diagnosis Date   Diabetes mellitus without complication (HCC)    GERD (gastroesophageal reflux disease)    Hyperlipidemia    Hypertension    Pancreatitis due to common bile duct stone    UTI (urinary tract infection)     Past Surgical History:  Procedure Laterality Date   CHOLECYSTECTOMY     OVARIAN CYST REMOVAL      Family History  Problem Relation Age of Onset   Hypertension Mother    Diabetes Father    Gestational diabetes Sister    Cancer Maternal Grandmother        lung   Heart disease Maternal Grandmother    Hypertension Maternal Grandmother     Alcohol abuse Maternal Grandfather    Heart disease Maternal Grandfather    Heart attack Maternal Grandfather    Diabetes Paternal Grandmother    Heart disease Paternal Grandmother    Hearing loss Paternal Grandfather    Heart attack Paternal Grandfather     Social History   Tobacco Use   Smoking status: Never   Smokeless tobacco: Never  Substance Use Topics   Alcohol use: Yes    Subjective:   Concerned about rash she noticed on her right bra line over the weekend; denies any pain or blisters at site; red, itchy rash noted; is concerned for shingles;  Feeling much better with UTI- no further fever or night sweats; has not finished antibiotics yet;  Tolerating Metformin well- okay to get glucose re-checked today; wants to wait on glucometer until she sees her PCP later this month.     Objective:  Vitals:   09/04/21 0958  BP: 136/78  Pulse: 99  Temp: 98 F (36.7 C)  TempSrc: Oral  SpO2: 99%  Weight: 262 lb (118.8 kg)  Height: 5\' 4"  (1.626 m)    General: Well developed, well nourished, in no acute distress  Skin : Warm and dry. Macular, erythematous rash noted in skin fold to right of breast; no vesicles noted;  Head: Normocephalic and atraumatic  Eyes: Sclera and conjunctiva clear; pupils round and reactive to light; extraocular movements intact  Ears: External normal; canals  clear; tympanic membranes normal  Oropharynx: Pink, supple. No suspicious lesions  Neck: Supple without thyromegaly, adenopathy  Lungs: Respirations unlabored;  Neurologic: Alert and oriented; speech intact; face symmetrical; moves all extremities well; CNII-XII intact without focal deficit   Assessment:  1. [place] intertrigo   2. Elevated glucose   3. Urinary tract infection without hematuria, site unspecified     Plan:   Rx for Diflucan 100 mg qd x 7 days; Rx for Mycolog II apply bid to affected area;  Re-check BMP today; continue Metformin as prescribed; keep planned follow up for  diabetes follow up with her PCP for later this month; patient defers glucometer at this time. Reviewed culture results- feeling better; finish antibiotics and get repeat culture with her PCP later this month to ensure resolution.  This visit occurred during the SARS-CoV-2 public health emergency.  Safety protocols were in place, including screening questions prior to the visit, additional usage of staff PPE, and extensive cleaning of exam room while observing appropriate contact time as indicated for disinfecting solutions.    No follow-ups on file.  Orders Placed This Encounter  Procedures   Basic Metabolic Panel (BMET)    Requested Prescriptions   Signed Prescriptions Disp Refills   fluconazole (DIFLUCAN) 100 MG tablet 7 tablet 0    Sig: Take 1 tablet (100 mg total) by mouth daily.   nystatin-triamcinolone (MYCOLOG II) cream 60 g 0    Sig: Apply 1 application topically 2 (two) times daily.

## 2021-09-13 DIAGNOSIS — Z0289 Encounter for other administrative examinations: Secondary | ICD-10-CM

## 2021-09-19 ENCOUNTER — Other Ambulatory Visit: Payer: Self-pay

## 2021-09-19 ENCOUNTER — Encounter: Payer: Self-pay | Admitting: Family Medicine

## 2021-09-19 ENCOUNTER — Ambulatory Visit: Payer: Commercial Managed Care - PPO | Admitting: Family Medicine

## 2021-09-19 VITALS — BP 120/70 | HR 113 | Temp 97.8°F | Resp 16 | Ht 64.0 in | Wt 263.6 lb

## 2021-09-19 DIAGNOSIS — Z23 Encounter for immunization: Secondary | ICD-10-CM

## 2021-09-19 DIAGNOSIS — E1165 Type 2 diabetes mellitus with hyperglycemia: Secondary | ICD-10-CM | POA: Diagnosis not present

## 2021-09-19 DIAGNOSIS — E1169 Type 2 diabetes mellitus with other specified complication: Secondary | ICD-10-CM | POA: Diagnosis not present

## 2021-09-19 DIAGNOSIS — E785 Hyperlipidemia, unspecified: Secondary | ICD-10-CM

## 2021-09-19 LAB — BASIC METABOLIC PANEL
BUN: 12 mg/dL (ref 6–23)
CO2: 26 mEq/L (ref 19–32)
Calcium: 9.3 mg/dL (ref 8.4–10.5)
Chloride: 100 mEq/L (ref 96–112)
Creatinine, Ser: 0.57 mg/dL (ref 0.40–1.20)
GFR: 109.68 mL/min (ref >60.00)
Glucose, Bld: 255 mg/dL — ABNORMAL HIGH (ref 70–99)
Potassium: 4.1 mEq/L (ref 3.5–5.1)
Sodium: 135 mEq/L (ref 135–145)

## 2021-09-19 LAB — LIPID PANEL
Cholesterol: 213 mg/dL — ABNORMAL HIGH (ref 0–200)
HDL: 33.9 mg/dL — ABNORMAL LOW (ref >39.00)
LDL Cholesterol: 150 mg/dL — ABNORMAL HIGH (ref 0–99)
NonHDL: 179.11
Total CHOL/HDL Ratio: 6
Triglycerides: 145 mg/dL (ref 0.0–149.0)
VLDL: 29 mg/dL (ref 0.0–40.0)

## 2021-09-19 LAB — HEPATIC FUNCTION PANEL
ALT: 79 U/L — ABNORMAL HIGH (ref 0–35)
AST: 55 U/L — ABNORMAL HIGH (ref 0–37)
Albumin: 4.3 g/dL (ref 3.5–5.2)
Alkaline Phosphatase: 65 U/L (ref 39–117)
Bilirubin, Direct: 0.2 mg/dL (ref 0.0–0.3)
Total Bilirubin: 0.6 mg/dL (ref 0.2–1.2)
Total Protein: 6.8 g/dL (ref 6.0–8.3)

## 2021-09-19 LAB — TSH: TSH: 1.77 u[IU]/mL (ref 0.35–5.50)

## 2021-09-19 NOTE — Patient Instructions (Signed)
Follow up early December to recheck A1C We'll notify you of your lab results and make any changes if needed Continue to work on healthy diet and regular exercise- you can do it!! Schedule your eye exam at your convenience Drink LOTS of water! Call with any questions or concerns You got this!!!

## 2021-09-19 NOTE — Assessment & Plan Note (Signed)
BMI 45.25  She was previously successful losing weight but has recently regained most of what she lost.  She has appt w/ [facility] and is restarting [employer].  Applauded her ownership of the problem and her plan to go forward.  Will follow closely.

## 2021-09-19 NOTE — Assessment & Plan Note (Signed)
Deteriorated.  Pt's A1C was 6.4 but increased to 10.3% on 9/2.  Admits to poor eating, no exercise.  Is now on Metformin.  Is doing ok w/ exception of diarrhea.  Not interested in changing meds at this time.  Pt plans on working intensively on diet and weight loss.  Will add meds at next visit if needed.  Check microalbumin.  Pt to schedule eye exam.  Foot exam done today.  Pt expressed understanding and is in agreement w/ plan.

## 2021-09-19 NOTE — Assessment & Plan Note (Signed)
Chronic problem.  Pt's last LDL was 127 but since A1C was well controlled we held off on a statin.  Given her uncontrolled diabetes, we will be much more likely to start medication.  Will follow.

## 2021-09-19 NOTE — Progress Notes (Signed)
   Subjective:    Patient ID: Dawn Downs, female    DOB: [birth year], 45 y.o.   MRN: [id number]  HPI DM- chronic problem.  At CPE in December, A1C 6.4  on 9/2 it was 10.3%.  Glucose was 429 in office when she was seen for UTI.  Was started on Metformin 1000mg  BID.  'it's tearing up my stomach'.  Had similar issues w/ Metformin in the past, but this improved w/ time.  Had Bartholin's abscess that was treated by GYN.  No CP, SOB, HAs, visual changes.  No abd pain.  + diarrhea.  No numbness/tingling of hands/feet.  Hyperlipidemia- not currently on a statin.  Last LDL 127.  Obesity- pt has gained 6 lbs since last December.  BMI now 45.25.  Has appt tomorrow w/ [facility].  Recently restarted January.   Review of Systems For ROS see HPI   This visit occurred during the SARS-CoV-2 public health emergency.  Safety protocols were in place, including screening questions prior to the visit, additional usage of staff PPE, and extensive cleaning of exam room while observing appropriate contact time as indicated for disinfecting solutions.      Objective:   Physical Exam Vitals reviewed.  Constitutional:      General: She is not in acute distress.    Appearance: Normal appearance. She is well-developed. She is obese.  HENT:     Head: Normocephalic and atraumatic.  Eyes:     Conjunctiva/sclera: Conjunctivae normal.     Pupils: Pupils are equal, round, and reactive to light.  Neck:     Thyroid: No thyromegaly.  Cardiovascular:     Rate and Rhythm: Normal rate and regular rhythm.     Pulses: Normal pulses.     Heart sounds: Normal heart sounds. No murmur heard. Pulmonary:     Effort: Pulmonary effort is normal. No respiratory distress.     Breath sounds: Normal breath sounds.  Abdominal:     General: There is no distension.     Palpations: Abdomen is soft.     Tenderness: There is no abdominal tenderness.  Musculoskeletal:     Cervical back: Normal range of motion and neck  supple.     Right lower leg: No edema.     Left lower leg: No edema.  Lymphadenopathy:     Cervical: No cervical adenopathy.  Skin:    General: Skin is warm and dry.  Neurological:     Mental Status: She is alert and oriented to person, place, and time.  Psychiatric:        Behavior: Behavior normal.          Assessment & Plan:

## 2021-09-20 ENCOUNTER — Other Ambulatory Visit: Payer: Self-pay

## 2021-09-20 ENCOUNTER — Ambulatory Visit (INDEPENDENT_AMBULATORY_CARE_PROVIDER_SITE_OTHER): Payer: Commercial Managed Care - PPO | Admitting: Family Medicine

## 2021-09-20 ENCOUNTER — Encounter (INDEPENDENT_AMBULATORY_CARE_PROVIDER_SITE_OTHER): Payer: Self-pay | Admitting: Family Medicine

## 2021-09-20 VITALS — BP 138/88 | HR 102 | Temp 98.1°F | Ht 64.0 in | Wt 258.0 lb

## 2021-09-20 DIAGNOSIS — Z1331 Encounter for screening for depression: Secondary | ICD-10-CM | POA: Diagnosis not present

## 2021-09-20 DIAGNOSIS — E1159 Type 2 diabetes mellitus with other circulatory complications: Secondary | ICD-10-CM

## 2021-09-20 DIAGNOSIS — E1169 Type 2 diabetes mellitus with other specified complication: Secondary | ICD-10-CM

## 2021-09-20 DIAGNOSIS — Z9189 Other specified personal risk factors, not elsewhere classified: Secondary | ICD-10-CM

## 2021-09-20 DIAGNOSIS — R5383 Other fatigue: Secondary | ICD-10-CM

## 2021-09-20 DIAGNOSIS — K869 Disease of pancreas, unspecified: Secondary | ICD-10-CM

## 2021-09-20 DIAGNOSIS — F39 Unspecified mood [affective] disorder: Secondary | ICD-10-CM

## 2021-09-20 DIAGNOSIS — Z6841 Body Mass Index (BMI) 40.0 and over, adult: Secondary | ICD-10-CM

## 2021-09-20 DIAGNOSIS — I152 Hypertension secondary to endocrine disorders: Secondary | ICD-10-CM

## 2021-09-20 DIAGNOSIS — E782 Mixed hyperlipidemia: Secondary | ICD-10-CM

## 2021-09-20 DIAGNOSIS — R0602 Shortness of breath: Secondary | ICD-10-CM

## 2021-09-20 DIAGNOSIS — E785 Hyperlipidemia, unspecified: Secondary | ICD-10-CM

## 2021-09-20 LAB — CBC WITH DIFFERENTIAL/PLATELET
Basophils Absolute: 0.1 10*3/uL (ref 0.0–0.1)
Basophils Relative: 1.1 % (ref 0.0–3.0)
Eosinophils Absolute: 0.2 10*3/uL (ref 0.0–0.7)
Eosinophils Relative: 2.1 % (ref 0.0–5.0)
HCT: 38.2 % (ref 36.0–46.0)
Hemoglobin: 12.8 g/dL (ref 12.0–15.0)
Lymphocytes Relative: 20.6 % (ref 12.0–46.0)
Lymphs Abs: 1.8 10*3/uL (ref 0.7–4.0)
MCHC: 33.5 g/dL (ref 30.0–36.0)
MCV: 77.5 fl — ABNORMAL LOW (ref 78.0–100.0)
Monocytes Absolute: 0.5 10*3/uL (ref 0.1–1.0)
Monocytes Relative: 5.6 % (ref 3.0–12.0)
Neutro Abs: 6 10*3/uL (ref 1.4–7.7)
Neutrophils Relative %: 70.6 % (ref 43.0–77.0)
Platelets: 330 10*3/uL (ref 150.0–400.0)
RBC: 4.93 Mil/uL (ref 3.87–5.11)
RDW: 16.2 % — ABNORMAL HIGH (ref 11.5–15.5)
WBC: 8.5 10*3/uL (ref 4.0–10.5)

## 2021-09-20 LAB — MICROALBUMIN / CREATININE URINE RATIO
Creatinine,U: 164 mg/dL
Microalb Creat Ratio: 0.6 mg/g (ref 0.0–30.0)
Microalb, Ur: 1.1 mg/dL (ref 0.0–1.9)

## 2021-09-20 MED ORDER — ROSUVASTATIN CALCIUM 10 MG PO TABS: 10.0000 mg | ORAL_TABLET | Freq: Every day | ORAL | 1 refills | Status: DC

## 2021-09-20 NOTE — Progress Notes (Signed)
ctr

## 2021-09-21 ENCOUNTER — Other Ambulatory Visit: Payer: Self-pay

## 2021-09-21 DIAGNOSIS — E785 Hyperlipidemia, unspecified: Secondary | ICD-10-CM

## 2021-09-21 DIAGNOSIS — E1169 Type 2 diabetes mellitus with other specified complication: Secondary | ICD-10-CM

## 2021-09-21 MED ORDER — ATORVASTATIN CALCIUM 20 MG PO TABS: 20.0000 mg | ORAL_TABLET | Freq: Every day | ORAL | 3 refills | Status: DC

## 2021-09-22 LAB — T4, FREE: Free T4: 1.32 ng/dL (ref 0.82–1.77)

## 2021-09-22 LAB — INSULIN, RANDOM: INSULIN: 27.9 u[IU]/mL — ABNORMAL HIGH (ref 2.6–24.9)

## 2021-09-22 LAB — VITAMIN D 25 HYDROXY (VIT D DEFICIENCY, FRACTURES): Vit D, 25-Hydroxy: 18.8 ng/mL — ABNORMAL LOW (ref 30.0–100.0)

## 2021-09-22 LAB — FOLATE: Folate: 6.4 ng/mL (ref >3.0)

## 2021-09-22 LAB — VITAMIN B12: Vitamin B-12: 383 pg/mL (ref 232–1245)

## 2021-09-24 ENCOUNTER — Encounter: Payer: Self-pay | Admitting: Family Medicine

## 2021-09-24 NOTE — Progress Notes (Signed)
Chief Complaint:   OBESITY Dawn Downs (MR# [id number]) is a 45 y.o. female who presents for evaluation and treatment of obesity and related comorbidities. Current BMI is Body mass index is 44.29 kg/m. Dawn Downs has been struggling with her weight for many years and has been unsuccessful in either losing weight, maintaining weight loss, or reaching her healthy weight goal.  Dawn Downs works in [employer] at [employer]. Her fitness tracker shows 3500 steps daily. In 2020 she lost 140 lbs with Weight Watchers and exercise (she has done it 2-3 other times in life as well). She craves grease, and snacks on chips and popcorn. With an inconsistent work schedule, she occasionally skips meals and then subsequently overeats after.  Dawn Downs is currently in the action stage of change and ready to dedicate time achieving and maintaining a healthier weight. Dawn Downs is interested in becoming our patient and working on intensive lifestyle modifications including (but not limited to) diet and exercise for weight loss.  Dawn Downs's habits were reviewed today and are as follows: Her family eats meals together, she thinks her family will eat healthier with her, her desired weight loss is 118 lbs, she has been heavy most of her life, she started gaining weight during childhood, her heaviest weight ever was close to 300 pounds, she has significant food cravings issues, she snacks frequently in the evenings, she skips meals frequently, she is frequently drinking liquids with calories, she frequently makes poor food choices, she frequently eats larger portions than normal, she has binge eating behaviors, and she struggles with emotional eating.  Depression Screen Dawn Downs's Food and Mood (modified PHQ-9) score was 18.  Depression screen Dawn Downs - [place] 2/9 09/20/2021  Decreased Interest 3  Down, Depressed, Hopeless 3  PHQ - 2 Score 6  Altered sleeping 3  Tired, decreased energy 3  Change in appetite 1  Feeling bad or  failure about yourself  2  Trouble concentrating 1  Moving slowly or fidgety/restless 2  Suicidal thoughts 0  PHQ-9 Score 18  Difficult doing work/chores Extremely dIfficult  Some recent data might be hidden   Subjective:   1. Other fatigue [name] admits to daytime somnolence and admits to waking up still tired. Patent has a history of symptoms of daytime fatigue and morning fatigue. Dawn Downs generally gets 4 or 5 hours of sleep per night, and states that she has nightime awakenings. Snoring is present. Apneic episodes are not present. Epworth Sleepiness Score is 9. She struggles with decreased energy levels recently and she knows it is from her poor diet.   2. Shortness of breath on exertion Dawn Downs notes increasing shortness of breath with exercising and seems to be worsening over time with weight gain. She notes getting out of breath sooner with activity than she used to. This has not gotten worse recently. Dawn Downs denies shortness of breath at rest or orthopnea.  3. Type 2 diabetes mellitus with other specified complication, without long-term current use of insulin (HCC) Dawn Downs's A1c two weeks ago was 10.3. She is treated by Dawn Downs. She was started on metformin 1,000 mg two weeks ago for this. She admits to poor diet and Downs activity. She declined GLP-1 and they will recheck in 3 months.  4. Hypertension associated with type 2 diabetes mellitus (HCC) Dawn Downs is not on medications. Yesterday at her doctor's office her blood pressure was 120's/70's, and that is what it normally runs.  5. Mixed diabetic hyperlipidemia associated with type 2 diabetes mellitus (HCC) Dawn Downs labs are  pending. Her primary care physician notes states she will reassess after labs and possibly start statin.  6. Mood disorder (HCC) with emotional eating Dawn Downs has a history of depression and anxiety, and she is taking Wellbutrin 300 mg. Her PHQ-9 score is 18 and she denies suicidal ideations. She  sees Dawn Downs for counseling every 3 weeks or so in [place].   7. Pancreatic disease Dawn Downs has a history of pancreatitis due to gallstone. She had her gallbladder removed in 2010, subsequently causing another flare of pancreatitis. None since her gallbladder issues in 2010.  8. At risk for hyperglycemia Dawn Downs is at increased risk for hyperglycemia due to high A1c and blood sugars. She is at very high risk of diabetic ketoacidosis.  Assessment/Plan:   Orders Placed This Encounter  Procedures   Vitamin B12   Folate   Insulin, random   T4, free   VITAMIN D 25 Hydroxy (Vit-D Deficiency, Fractures)   EKG 12-Lead    Medications Discontinued During This Encounter  Medication Reason   metFORMIN (GLUCOPHAGE) 500 MG tablet Error   nitrofurantoin, macrocrystal-monohydrate, (MACROBID) 100 MG capsule Error     No orders of the defined types were placed in this encounter.    1. Other fatigue Dawn Downs does feel that her weight is causing her energy to be lower than it should be. Fatigue may be related to obesity, depression or many other causes. Labs will be ordered, and in the meanwhile, I recommended her to follow up with her primary care physician for elevated scores if her fatigue doesn't improve with improved diet. Dawn Downs will focus on self care including making healthy food choices, increasing physical activity and focusing on stress reduction.  - Vitamin B12 - EKG 12-Lead - Folate - T4, free - VITAMIN D 25 Hydroxy (Vit-D Deficiency, Fractures)  2. Shortness of breath on exertion Dawn Downs does feel that she gets out of breath more easily that she used to when she exercises. Dawn Downs's shortness of breath appears to be obesity related and exercise induced. She has agreed to work on weight loss and gradually increase exercise to treat her exercise induced shortness of breath. Will continue to monitor closely.  3. Type 2 diabetes mellitus with other specified  complication, without long-term current use of insulin (HCC) We will check labs today. Dawn Downs will continue her medication management per her primary care physician for now. She will continue her home blood glucose monitoring per her primary care physician recommendations, and she will work on her prudent nutritional plan and weight loss. - Insulin, random  4. Hypertension associated with type 2 diabetes mellitus (HCC) I recommended home blood pressure monitoring, and Namine will continue her medication management per her primary care physician. She will work on her prudent nutritional plan, weight loss, and decreasing salt.  5. Mixed diabetic hyperlipidemia associated with type 2 diabetes mellitus (HCC) Dawn Downs will continue her medication management per her primary care physician. She will decrease saturated and trans fats, and will continue to monitor closely.  6. Mood disorder (HCC) with emotional eating Dawn Downs was given a referral for Dr. Dewaine Conger, our Bariatric Psychologist for evaluation. She will continue to follow up with her counselor and will continue Wellbutrin.  7. Pancreatic disease Dawn Downs will continue to monitor closely for symptoms, and will continue to follow up as directed.  8. Screening for depression Dawn Downs had a positive depression screening. Depression is commonly associated with obesity and often results in emotional eating behaviors. We will monitor this closely and work  on CBT to help improve the non-hunger eating patterns. Referral to Psychology may be required if no improvement is seen as she continues in our clinic.  9. At risk for hyperglycemia Dawn Downs was given approximately 23 minutes of counseling today regarding prevention of hyperglycemia. She was advised of hyperglycemia causes and the fact hyperglycemia is often asymptomatic. Dawn Downs was instructed to avoid skipping meals, eat regular protein rich meals and schedule Downs calorie but protein rich  snacks as needed.   Repetitive spaced learning was employed today to elicit superior memory formation and behavioral change  10. Obesity with current BMI of 44.4 Dawn Downs is currently in the action stage of change and her goal is to continue with weight loss efforts. I recommend Dawn Downs begin the structured treatment plan as follows:  She has agreed to the Category 3 Plan.  Exercise goals: As is.   Behavioral modification strategies: increasing lean protein intake, decreasing simple carbohydrates, and planning for success.  She was informed of the importance of frequent follow-up visits to maximize her success with intensive lifestyle modifications for her multiple health conditions. She was informed we would discuss her lab results at her next visit unless there is a critical issue that needs to be addressed sooner. Dawn Downs agreed to keep her next visit at the agreed upon time to discuss these results.  Objective:   Blood pressure 138/88, pulse (!) 102, temperature 98.1 F (36.7 C), height 5\' 4"  (1.626 m), weight 258 lb (117 kg), SpO2 96 %. Body mass index is 44.29 kg/m.  EKG: Normal sinus rhythm, rate 100 BPM.  Indirect Calorimeter completed today shows a VO2 of 344 and a REE of 2376.  Her calculated basal metabolic rate is thus her basal metabolic rate is better than expected.  General: Cooperative, alert, well developed, in no acute distress. HEENT: Conjunctivae and lids unremarkable. Cardiovascular: Regular rhythm.  Lungs: Normal work of breathing. Neurologic: No focal deficits.   Lab Results  Component Value Date   CREATININE 0.57 09/19/2021   BUN 12 09/19/2021   NA 135 09/19/2021   K 4.1 09/19/2021   CL 100 09/19/2021   CO2 26 09/19/2021   Lab Results  Component Value Date   ALT 79 (H) 09/19/2021   AST 55 (H) 09/19/2021   ALKPHOS 65 09/19/2021   BILITOT 0.6 09/19/2021   Lab Results  Component Value Date   HGBA1C 10.3 (H) 08/31/2021   HGBA1C 6.4 (H)  12/21/2020   HGBA1C 5.8 07/17/2020   HGBA1C 5.1 12/17/2019   HGBA1C 4.9 02/04/2019   Lab Results  Component Value Date   INSULIN 27.9 (H) 09/20/2021   Lab Results  Component Value Date   TSH 1.77 09/19/2021   Lab Results  Component Value Date   CHOL 213 (H) 09/19/2021   HDL 33.90 (L) 09/19/2021   LDLCALC 150 (H) 09/19/2021   TRIG 145.0 09/19/2021   CHOLHDL 6 09/19/2021   Lab Results  Component Value Date   WBC 8.5 09/19/2021   HGB 12.8 09/19/2021   HCT 38.2 09/19/2021   MCV 77.5 (L) 09/19/2021   PLT 330.0 09/19/2021   No results found for: IRON, TIBC, FERRITIN  Attestation Statements:   Reviewed by clinician on day of visit: allergies, medications, problem list, medical history, surgical history, family history, social history, and previous encounter notes.   09/21/2021, am acting as transcriptionist for Trude Mcburney, DO.  I have reviewed the above documentation for accuracy and completeness, and I agree with the above. -  Marjory Sneddon, D.O.  The [place] was signed into law in 2016 which includes the topic of electronic health records.  This provides immediate access to information in MyChart.  This includes consultation notes, operative notes, office notes, lab results and pathology reports.  If you have any questions about what you read please let us know at your next visit so we can discuss your concerns and take corrective action if need be.  We are right here with you.

## 2021-09-25 NOTE — Telephone Encounter (Signed)
Last seen 09/19/2021. Need a virtual visit or can you send in some medication?

## 2021-09-26 ENCOUNTER — Telehealth: Payer: Self-pay | Admitting: Family Medicine

## 2021-09-26 ENCOUNTER — Other Ambulatory Visit: Payer: Self-pay

## 2021-09-26 MED ORDER — CEPHALEXIN 500 MG PO CAPS: 500.0000 mg | ORAL_CAPSULE | Freq: Two times a day (BID) | ORAL | 0 refills | Status: AC

## 2021-09-26 NOTE — Telephone Encounter (Signed)
We can switch to Metformin XR 1000mg  1 tab daily, #30, 3 refills and see if sxs improve

## 2021-09-26 NOTE — Telephone Encounter (Signed)
Called and spoke with patient symptoms from her Metformin. Pt states that she is having loose and runny stools after eating and also UTI symptoms as well. Her UTI symptoms are mild but they are frequent urination and burning. She started on a higher dose around labor day. The symptoms came about 2 weeks after. She wants to know do you think she should go back to previous dose, try the XR or another med? Please advise

## 2021-09-26 NOTE — Telephone Encounter (Signed)
Patient has a question about her metformin - Can someone call her - she is experiencing side effects and would like to discuss them.

## 2021-09-27 ENCOUNTER — Other Ambulatory Visit: Payer: Self-pay

## 2021-09-27 DIAGNOSIS — E1169 Type 2 diabetes mellitus with other specified complication: Secondary | ICD-10-CM

## 2021-09-27 DIAGNOSIS — E1165 Type 2 diabetes mellitus with hyperglycemia: Secondary | ICD-10-CM

## 2021-09-27 MED ORDER — METFORMIN HCL ER 500 MG PO TB24: 500.0000 mg | ORAL_TABLET | Freq: Two times a day (BID) | ORAL | 3 refills | Status: DC

## 2021-09-27 NOTE — Telephone Encounter (Signed)
Called and left patient a message to return call to office.

## 2021-09-27 NOTE — Progress Notes (Signed)
etformin 

## 2021-09-27 NOTE — Telephone Encounter (Signed)
Patient called about her metformin - she has lost her medication so she has none.  Please call and advise about the XR
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
## 2021-10-03 ENCOUNTER — Other Ambulatory Visit: Payer: Self-pay

## 2021-10-03 ENCOUNTER — Ambulatory Visit (INDEPENDENT_AMBULATORY_CARE_PROVIDER_SITE_OTHER): Payer: Commercial Managed Care - PPO | Admitting: Family Medicine

## 2021-10-03 ENCOUNTER — Encounter (INDEPENDENT_AMBULATORY_CARE_PROVIDER_SITE_OTHER): Payer: Self-pay | Admitting: Family Medicine

## 2021-10-03 VITALS — BP 138/75 | HR 95 | Temp 97.6°F | Ht 64.0 in | Wt 255.0 lb

## 2021-10-03 DIAGNOSIS — F39 Unspecified mood [affective] disorder: Secondary | ICD-10-CM | POA: Diagnosis not present

## 2021-10-03 DIAGNOSIS — E785 Hyperlipidemia, unspecified: Secondary | ICD-10-CM

## 2021-10-03 DIAGNOSIS — E1169 Type 2 diabetes mellitus with other specified complication: Secondary | ICD-10-CM

## 2021-10-03 DIAGNOSIS — Z6841 Body Mass Index (BMI) 40.0 and over, adult: Secondary | ICD-10-CM

## 2021-10-03 DIAGNOSIS — E559 Vitamin D deficiency, unspecified: Secondary | ICD-10-CM

## 2021-10-03 DIAGNOSIS — Z9189 Other specified personal risk factors, not elsewhere classified: Secondary | ICD-10-CM

## 2021-10-03 MED ORDER — VITAMIN D (ERGOCALCIFEROL) 1.25 MG (50000 UNIT) PO CAPS: 50000.0000 [IU] | ORAL_CAPSULE | ORAL | 0 refills | Status: DC

## 2021-10-04 NOTE — Progress Notes (Signed)
Chief Complaint:   OBESITY Dawn Downs is here to discuss her progress with her obesity treatment plan along with follow-up of her obesity related diagnoses. Dawn Downs is on the Category 3 Plan and states she is following her eating plan approximately 20% of the time. Dawn Downs states she has increased activity.  Today's visit was #: 2 Starting weight: 258 lbs Starting date: 09/20/2021 Today's weight: 255 lbs Today's date: 10/03/2021 Total lbs lost to date: 3 Total lbs lost since last in-office visit: 3  Interim History: Dawn Downs is here today for her first follow-up office visit since starting the program with [place].  All blood work/ lab tests that were recently ordered by myself or an outside provider were reviewed with patient today per their request.   Extended time was spent counseling her on all new disease processes that were discovered or preexisting ones that are affected by BMI.  she understands that many of these abnormalities will need to monitored regularly along with the current treatment plan of prudent dietary changes, in which we are making each and every office visit, to improve these health parameters. Dawn Downs is not weighing any protein or measuring vegetables. She says dinner is a challenge because she doesn't cook. She also works 2 jobs and has no time to food prep.  Subjective:   1. Type 2 diabetes mellitus with other specified complication, without long-term current use of insulin (HCC) Discussed labs with patient today. Not at goal. Dawn Downs was started on Metformin XR 500 mg BID about a month ago. Her A1c is 10.3.  2. Hyperlipidemia associated with type 2 diabetes mellitus (HCC) Discussed labs with patient today. Dawn Downs's PCP started her on Crestor about 2 weeks ago. Dawn Downs is tolerating medication(s) well without side effects.  Medication compliance is good and patient appears to be taking it as prescribed.  Denies additional concerns regarding  this condition.   3. Vitamin D deficiency New. Discussed labs with patient today. She is currently taking no vitamin D supplement. She denies nausea, vomiting or muscle weakness.  Lab Results  Component Value Date   VD25OH 18.8 (L) 09/20/2021   4. Mood disorder (HCC) with emotional eating Stable. Dawn Downs is struggling with emotional eating and using food for comfort to the extent that it is negatively impacting her health. She has been working on behavior modification techniques to help reduce her emotional eating. She shows no sign of suicidal or homicidal ideations. Medication: Wellbutrin  5. At risk for heart disease Dawn Downs is at a higher than average risk for cardiovascular disease due to obesity.   Assessment/Plan:  No orders of the defined types were placed in this encounter.   Medications Discontinued During This Encounter  Medication Reason   atorvastatin (LIPITOR) 20 MG tablet Error     Meds ordered this encounter  Medications   Vitamin D, Ergocalciferol, (DRISDOL) 1.25 MG (50000 UNIT) CAPS capsule    Sig: Take 1 capsule (50,000 Units total) by mouth every 7 (seven) days.    Dispense:  4 capsule    Refill:  0    30 d supply;  ** OV for RF **   Do not send RF request     1. Type 2 diabetes mellitus with other specified complication, without long-term current use of insulin (HCC) Good blood sugar control is important to decrease the likelihood of diabetic complications such as nephropathy, neuropathy, limb loss, blindness, coronary artery disease, and death. Intensive lifestyle modification including diet, exercise and  weight loss are the first line of treatment for diabetes. PCP plans to recheck A1c in 3 months after starting Metformin.  2. Hyperlipidemia associated with type 2 diabetes mellitus (HCC) Cardiovascular risk and specific lipid/LDL goals reviewed.  We discussed several lifestyle modifications today and Copelyn will continue to work on diet, exercise and  weight loss efforts. Orders and follow up as documented in patient record. Continue prudent nutritional plan, decrease saturated and trans fats. Recheck cholesterol and ALT in 2-3 months.  Counseling Intensive lifestyle modifications are the first line treatment for this issue. Dietary changes: Increase soluble fiber. Decrease simple carbohydrates. Exercise changes: Moderate to vigorous-intensity aerobic activity 150 minutes per week if tolerated. Lipid-lowering medications: see documented in medical record.  3. Vitamin D deficiency Plan: - Discussed importance of vitamin D to their health and well-being.  - possible symptoms of low Vitamin D can be low energy, depressed mood, muscle aches, joint aches, osteoporosis etc. - low Vitamin D levels may be linked to an increased risk of cardiovascular events and even increased risk of cancers- such as colon and breast.  - I recommend pt take a 50,000 IU weekly prescription vit D - see script below   - Informed patient this may be a lifelong thing, and she was encouraged to continue to take the medicine until told otherwise.   - we will need to monitor levels regularly (every 3-4 mo on average) to keep levels within normal limits.  - weight loss will likely improve availability of vitamin D, thus encouraged Saje to continue with meal plan and their weight loss efforts to further improve this condition - pt's questions and concerns regarding this condition addressed.  Start- Vitamin D, Ergocalciferol, (DRISDOL) 1.25 MG (50000 UNIT) CAPS capsule; Take 1 capsule (50,000 Units total) by mouth every 7 (seven) days.  Dispense: 4 capsule; Refill: 0  4. Mood disorder (HCC) with emotional eating Follow up with Dr. Dewaine Conger and counselor.  5. At risk for heart disease Due to Dawn Downs's current state of health and medical condition(s), she is at a higher risk for heart disease.  This puts the patient at much greater risk to subsequently develop  cardiopulmonary conditions that can significantly affect patient's quality of life in a negative manner.    At least 23 minutes were spent on counseling Dawn Downs about these concerns today, and I stressed the importance of reversing risks factors of obesity, especially truncal and visceral fat, hypertension, hyperlipidemia, and pre-diabetes.  The initial goal is to lose at least 5-10% of starting weight to help reduce these risk factors.  Counseling:  Intensive lifestyle modifications were discussed with Dawn Downs as the most appropriate first line of treatment.  she will continue to work on diet, exercise, and weight loss efforts.  We will continue to reassess these conditions on a fairly regular basis in an attempt to decrease the patient's overall morbidity and mortality.  Evidence-based interventions for health behavior change were utilized today including the discussion of self monitoring techniques, problem-solving barriers, and SMART goal setting techniques.  Specifically, regarding patient's less desirable eating habits and patterns, we employed the technique of small changes when Dawn Downs has not been able to fully commit to her prudent nutritional plan.  6. Obesity with current BMI of 43.8  Dawn Downs is currently in the action stage of change. As such, her goal is to continue with weight loss efforts. She has agreed to the Category 3 Plan.   Exercise goals:  Walk 1 mile 3 days a week.  Behavioral modification strategies: increasing lean protein intake, decreasing simple carbohydrates, meal planning and cooking strategies, and planning for success.  Dawn Downs has agreed to follow-up with our clinic in 2 weeks. She was informed of the importance of frequent follow-up visits to maximize her success with intensive lifestyle modifications for her multiple health conditions.   Objective:   Blood pressure 138/75, pulse 95, temperature 97.6 F (36.4 C), height 5\' 4"  (1.626 m), weight 255 lb (115.7  kg), SpO2 97 %. Body mass index is 43.77 kg/m.  General: Cooperative, alert, well developed, in no acute distress. HEENT: Conjunctivae and lids unremarkable. Cardiovascular: Regular rhythm.  Lungs: Normal work of breathing. Neurologic: No focal deficits.   Lab Results  Component Value Date   CREATININE 0.57 09/19/2021   BUN 12 09/19/2021   NA 135 09/19/2021   K 4.1 09/19/2021   CL 100 09/19/2021   CO2 26 09/19/2021   Lab Results  Component Value Date   ALT 79 (H) 09/19/2021   AST 55 (H) 09/19/2021   ALKPHOS 65 09/19/2021   BILITOT 0.6 09/19/2021   Lab Results  Component Value Date   HGBA1C 10.3 (H) 08/31/2021   HGBA1C 6.4 (H) 12/21/2020   HGBA1C 5.8 07/17/2020   HGBA1C 5.1 12/17/2019   HGBA1C 4.9 02/04/2019   Lab Results  Component Value Date   INSULIN 27.9 (H) 09/20/2021   Lab Results  Component Value Date   TSH 1.77 09/19/2021   Lab Results  Component Value Date   CHOL 213 (H) 09/19/2021   HDL 33.90 (L) 09/19/2021   LDLCALC 150 (H) 09/19/2021   TRIG 145.0 09/19/2021   CHOLHDL 6 09/19/2021   Lab Results  Component Value Date   VD25OH 18.8 (L) 09/20/2021   Lab Results  Component Value Date   WBC 8.5 09/19/2021   HGB 12.8 09/19/2021   HCT 38.2 09/19/2021   MCV 77.5 (L) 09/19/2021   PLT 330.0 09/19/2021    Attestation Statements:   Reviewed by clinician on day of visit: allergies, medications, problem list, medical history, surgical history, family history, social history, and previous encounter notes.  09/21/2021, CMA, am acting as transcriptionist for Edmund Hilda, DO.  I have reviewed the above documentation for accuracy and completeness, and I agree with the above. [employer], D.O.  The 21st Century Cures Act was signed into law in 2016 which includes the topic of electronic health records.  This provides immediate access to information in MyChart.  This includes consultation notes, operative notes, office notes, lab  results and pathology reports.  If you have any questions about what you read please let 2017 know at your next visit so we can discuss your concerns and take corrective action if need be.  We are right here with you.

## 2021-10-15 ENCOUNTER — Encounter (INDEPENDENT_AMBULATORY_CARE_PROVIDER_SITE_OTHER): Payer: Self-pay

## 2021-10-15 ENCOUNTER — Telehealth (INDEPENDENT_AMBULATORY_CARE_PROVIDER_SITE_OTHER): Payer: Self-pay | Admitting: Psychology

## 2021-10-15 ENCOUNTER — Telehealth (INDEPENDENT_AMBULATORY_CARE_PROVIDER_SITE_OTHER): Payer: Commercial Managed Care - PPO | Admitting: Psychology

## 2021-10-15 NOTE — Telephone Encounter (Signed)
  Office: [phone]  /  Fax: [phone]  Date of Call: October 15, 2021  Time of Call: 9:02am Provider: Lawerance Cruel, PsyD  CONTENT: This provider called Dawn Downs to check-in as she did not present for today's MyChart Video Visit appointment at 9:00am. A HIPAA compliant voicemail was left requesting a call back. Of note, this provider stayed on the MyChart Video Visit appointment for 5 minutes prior to signing off per the clinic's grace period policy.    PLAN: This provider will wait for [name] to call back. No further follow-up planned by this provider.
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
## 2021-10-18 ENCOUNTER — Telehealth: Payer: Self-pay

## 2021-10-18 DIAGNOSIS — E1165 Type 2 diabetes mellitus with hyperglycemia: Secondary | ICD-10-CM

## 2021-10-18 MED ORDER — METFORMIN HCL ER 500 MG PO TB24: 500.0000 mg | ORAL_TABLET | Freq: Two times a day (BID) | ORAL | 0 refills | Status: DC

## 2021-10-18 NOTE — Telephone Encounter (Signed)
Pres[facility] 6 pills [facility] in [facility] as requested

## 2021-10-18 NOTE — Telephone Encounter (Signed)
Called patient and informed her per you that the 6 pills of metformin has been sent to the [facility] [place]. Patient understood.

## 2021-10-18 NOTE — Telephone Encounter (Signed)
Patient is out of town without her meds and would like a 6 day supply of the metformin. Please advise

## 2021-10-18 NOTE — Telephone Encounter (Signed)
Caller name:Ennis Dayton Scrape  On DPR? :yes  Call back [phone]  Provider they see: Beverely Low   Reason for call:Pt is calling pt is out of town till Sunday and wants advice what she needs to do since she left her metFORMIN (GLUCOPHAGE XR) 500 MG 24 hr tablet [[id number]]   If meds can be sent she is headed to [facility] [id number] [address], [place] need 6 pills   Pt would like to be called once it is sent

## 2021-10-22 NOTE — Telephone Encounter (Signed)
This concern has been previously addressed by myself and/or another provider.  If they patient has ongoing concerns, they can contact me at their convenience.  Thank you,  Rich Jakevious Hollister, NP 

## 2021-10-24 ENCOUNTER — Encounter (INDEPENDENT_AMBULATORY_CARE_PROVIDER_SITE_OTHER): Payer: Self-pay | Admitting: Family Medicine

## 2021-10-24 ENCOUNTER — Other Ambulatory Visit: Payer: Self-pay

## 2021-10-24 ENCOUNTER — Ambulatory Visit (INDEPENDENT_AMBULATORY_CARE_PROVIDER_SITE_OTHER): Payer: Commercial Managed Care - PPO | Admitting: Family Medicine

## 2021-10-24 VITALS — BP 152/76 | HR 97 | Temp 97.9°F | Ht 64.0 in | Wt 249.0 lb

## 2021-10-24 DIAGNOSIS — R030 Elevated blood-pressure reading, without diagnosis of hypertension: Secondary | ICD-10-CM | POA: Diagnosis not present

## 2021-10-24 DIAGNOSIS — E559 Vitamin D deficiency, unspecified: Secondary | ICD-10-CM | POA: Diagnosis not present

## 2021-10-24 DIAGNOSIS — K5900 Constipation, unspecified: Secondary | ICD-10-CM | POA: Diagnosis not present

## 2021-10-24 DIAGNOSIS — Z6841 Body Mass Index (BMI) 40.0 and over, adult: Secondary | ICD-10-CM

## 2021-10-24 DIAGNOSIS — Z9189 Other specified personal risk factors, not elsewhere classified: Secondary | ICD-10-CM

## 2021-10-24 MED ORDER — MAGNESIUM CHLORIDE POWD: 0 refills | Status: AC

## 2021-10-24 MED ORDER — VITAMIN D (ERGOCALCIFEROL) 1.25 MG (50000 UNIT) PO CAPS: 50000.0000 [IU] | ORAL_CAPSULE | ORAL | 0 refills | Status: DC

## 2021-10-25 NOTE — Progress Notes (Signed)
Chief Complaint:   OBESITY Dawn Downs is here to discuss her progress with her obesity treatment plan along with follow-up of her obesity related diagnoses. [name] is on the Category 3 Plan and states she is following her eating plan approximately 75% of the time. [name] states she is walking for 30 minutes 2 times per week.  Today's visit was #: 3 Starting weight: 258 lbs Starting date: 09/20/2021 Today's weight: 249 lbs Today's date: 10/23/2021 Total lbs lost to date: 9 Total lbs lost since last in-office visit: 6  Interim History: [name] increased her activity since her last office visit. She is weighing her proteins now, and she states she is getting them all in. She is doing a better job with more meal prepping. [name] is here for a follow up office visit. We reviewed her meal plan and questions were answered.  Patient's food recall appears to be accurate and consistent with what is on plan when she is following it. When eating on plan, her hunger and cravings are well controlled.    Subjective:   1. Vitamin D deficiency Dawn Downs is currently taking prescription vitamin D 50,000 IU each week. She denies nausea, vomiting or muscle weakness. She is tolerating it well, and was started on Ergo at her last visit.  2. Constipation, unspecified constipation type Dawn Downs notes mild symptoms, but she started magnesium supplementations 250 mg PO q AM which has helped a lot.   3. Elevated blood pressure reading Dawn Downs is asymptomatic and she denies concerns. She is "anti-medicines".   4. At risk for dehydration Dawn Downs is at risk for dehydration due to inadequate water intake.  Assessment/Plan:  No orders of the defined types were placed in this encounter.   Medications Discontinued During This Encounter  Medication Reason   Vitamin D, Ergocalciferol, (DRISDOL) 1.25 MG (50000 UNIT) CAPS capsule Reorder     Meds ordered this encounter  Medications   Vitamin  D, Ergocalciferol, (DRISDOL) 1.25 MG (50000 UNIT) CAPS capsule    Sig: Take 1 capsule (50,000 Units total) by mouth every 7 (seven) days.    Dispense:  4 capsule    Refill:  0    30 d supply;  ** OV for RF **   Do not send RF request   Magnesium Chloride POWD    Sig: 325 mg q hs    Refill:  0     1. Vitamin D deficiency Low Vitamin D level contributes to fatigue and are associated with obesity, breast, and colon cancer. We will refill prescription Vitamin D for 1 month. [name] will follow-up for routine testing of Vitamin D, at least 2-3 times per year to avoid over-replacement.  - Vitamin D, Ergocalciferol, (DRISDOL) 1.25 MG (50000 UNIT) CAPS capsule; Take 1 capsule (50,000 Units total) by mouth every 7 (seven) days.  Dispense: 4 capsule; Refill: 0  2. Constipation, unspecified constipation type Dawn Downs agreed to increase magnesium to 325 mg qhs, and increase water from 60 to 80 oz q daily and increase activity. She was informed that a decrease in bowel movement frequency is normal while losing weight, but stools should not be hard or painful. Orders and follow up as documented in patient record.   Counseling Getting to Good Bowel Health: Your goal is to have one soft bowel movement each day. Drink at least 8 glasses of water each day. Eat plenty of fiber (goal is over 25 grams each day). It is best to get most of your fiber  from dietary sources which includes leafy green vegetables, fresh fruit, and whole grains. You may need to add fiber with the help of OTC fiber supplements. These include Metamucil, Citrucel, and Flaxseed. If you are still having trouble, try adding Miralax or Magnesium Citrate. If all of these changes do not work, Dietitian.  - Magnesium Chloride POWD; 325 mg q hs; Refill: 0  3. Elevated blood pressure reading Dawn Downs will follow up closely, decrease salt by following her prudent nutritional plan, and weight loss. If her blood pressure remains elevated,  she will need medicine.  4. At risk for dehydration Dawn Downs was given approximately 9 minutes dehydration prevention counseling today. Dawn Downs is at risk for dehydration due to weight loss and current medication(s). She was encouraged to hydrate and monitor fluid status to avoid dehydration as well as weight loss plateaus.   5. Obesity with current BMI of 42.8 Dawn Downs is currently in the action stage of change. As such, her goal is to continue with weight loss efforts. She has agreed to the Category 3 Plan.   Exercise goals: As is, Dawn Downs plans to increase her walking to 30 minutes 3 days per week.  Behavioral modification strategies: no skipping meals, avoiding temptations, and planning for success.  Dawn Downs has agreed to follow-up with our clinic in 2 to 3 weeks. She was informed of the importance of frequent follow-up visits to maximize her success with intensive lifestyle modifications for her multiple health conditions.   Objective:   Blood pressure (!) 152/76, pulse 97, temperature 97.9 F (36.6 C), height 5\' 4"  (1.626 m), weight 249 lb (112.9 kg), SpO2 97 %. Body mass index is 42.74 kg/m.  General: Cooperative, alert, well developed, in no acute distress. HEENT: Conjunctivae and lids unremarkable. Cardiovascular: Regular rhythm.  Lungs: Normal work of breathing. Neurologic: No focal deficits.   Lab Results  Component Value Date   CREATININE 0.57 09/19/2021   BUN 12 09/19/2021   NA 135 09/19/2021   K 4.1 09/19/2021   CL 100 09/19/2021   CO2 26 09/19/2021   Lab Results  Component Value Date   ALT 79 (H) 09/19/2021   AST 55 (H) 09/19/2021   ALKPHOS 65 09/19/2021   BILITOT 0.6 09/19/2021   Lab Results  Component Value Date   HGBA1C 10.3 (H) 08/31/2021   HGBA1C 6.4 (H) 12/21/2020   HGBA1C 5.8 07/17/2020   HGBA1C 5.1 12/17/2019   HGBA1C 4.9 02/04/2019   Lab Results  Component Value Date   INSULIN 27.9 (H) 09/20/2021   Lab Results  Component Value Date    TSH 1.77 09/19/2021   Lab Results  Component Value Date   CHOL 213 (H) 09/19/2021   HDL 33.90 (L) 09/19/2021   LDLCALC 150 (H) 09/19/2021   TRIG 145.0 09/19/2021   CHOLHDL 6 09/19/2021   Lab Results  Component Value Date   VD25OH 18.8 (L) 09/20/2021   Lab Results  Component Value Date   WBC 8.5 09/19/2021   HGB 12.8 09/19/2021   HCT 38.2 09/19/2021   MCV 77.5 (L) 09/19/2021   PLT 330.0 09/19/2021   No results found for: IRON, TIBC, FERRITIN  Attestation Statements:   Reviewed by clinician on day of visit: allergies, medications, problem list, medical history, surgical history, family history, social history, and previous encounter notes.   09/21/2021, am acting as transcriptionist for Trude Mcburney, DO.  I have reviewed the above documentation for accuracy and completeness, and I agree with the above. [employer]  Wendie Agreste, D.O.  The [place] was signed into law in 2016 which includes the topic of electronic health records.  This provides immediate access to information in MyChart.  This includes consultation notes, operative notes, office notes, lab results and pathology reports.  If you have any questions about what you read please let us know at your next visit so we can discuss your concerns and take corrective action if need be.  We are right here with you.

## 2021-10-30 ENCOUNTER — Telehealth (INDEPENDENT_AMBULATORY_CARE_PROVIDER_SITE_OTHER): Payer: Commercial Managed Care - PPO | Admitting: Psychology

## 2021-10-30 DIAGNOSIS — F5089 Other specified eating disorder: Secondary | ICD-10-CM | POA: Diagnosis not present

## 2021-11-01 ENCOUNTER — Other Ambulatory Visit: Payer: Self-pay

## 2021-11-01 ENCOUNTER — Other Ambulatory Visit (INDEPENDENT_AMBULATORY_CARE_PROVIDER_SITE_OTHER): Payer: Commercial Managed Care - PPO

## 2021-11-01 DIAGNOSIS — E1169 Type 2 diabetes mellitus with other specified complication: Secondary | ICD-10-CM | POA: Diagnosis not present

## 2021-11-01 DIAGNOSIS — E785 Hyperlipidemia, unspecified: Secondary | ICD-10-CM | POA: Diagnosis not present

## 2021-11-01 LAB — HEPATIC FUNCTION PANEL
ALT: 74 U/L — ABNORMAL HIGH (ref 0–35)
AST: 41 U/L — ABNORMAL HIGH (ref 0–37)
Albumin: 4.4 g/dL (ref 3.5–5.2)
Alkaline Phosphatase: 62 U/L (ref 39–117)
Bilirubin, Direct: 0 mg/dL (ref 0.0–0.3)
Total Bilirubin: 0.4 mg/dL (ref 0.2–1.2)
Total Protein: 7.5 g/dL (ref 6.0–8.3)

## 2021-11-06 IMAGING — US US BREAST*L* LIMITED INC AXILLA
1 series · 9 of 9 positions shown · non-contrast
Comparison: Previous exam(s).

CLINICAL DATA: Possible asymmetry in the outer retroareolar left
breast.

EXAM:
DIGITAL DIAGNOSTIC LEFT MAMMOGRAM WITH TOMO
ULTRASOUND LEFT BREAST

[Series 1: us breast*left* limited inc axilla · 0.06mm/px · 9 of 9 slices shown]
[im 1/9]
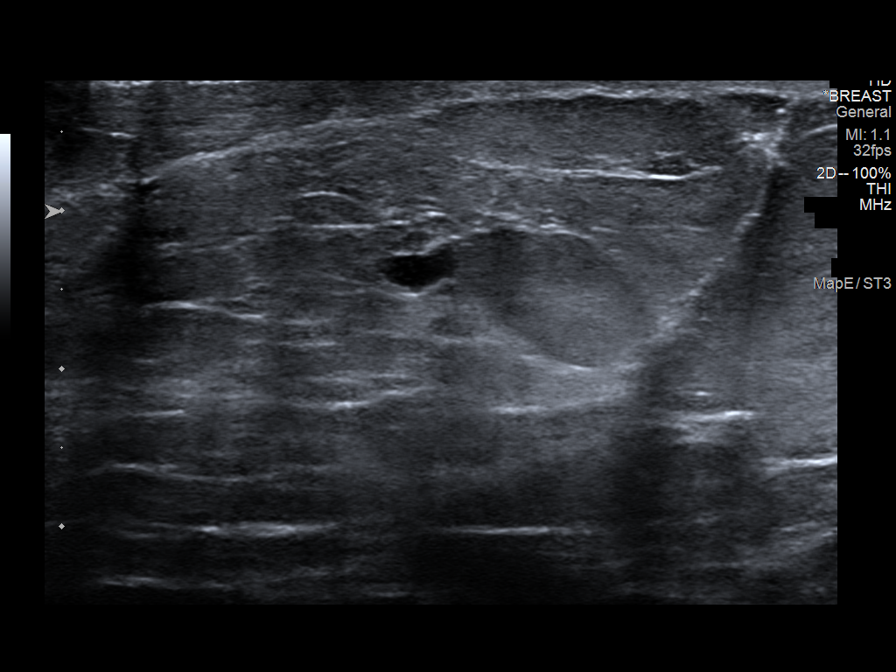
[im 2/9]
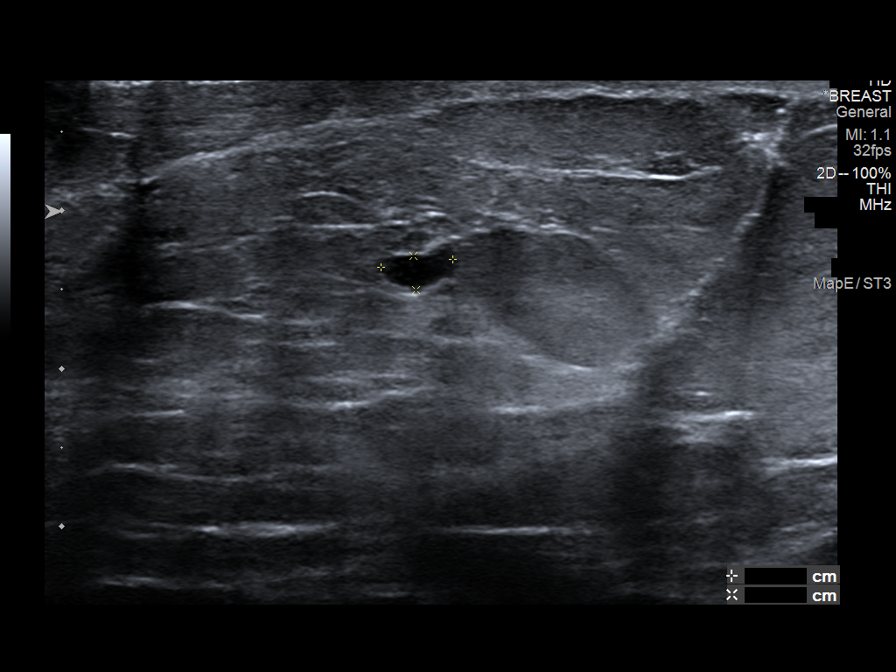
[im 3/9]
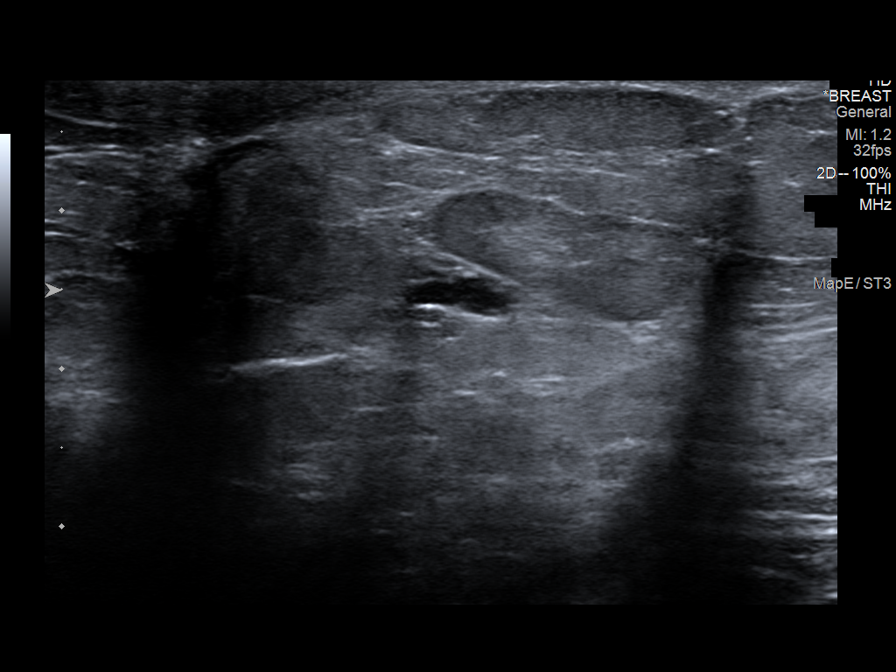
[im 4/9]
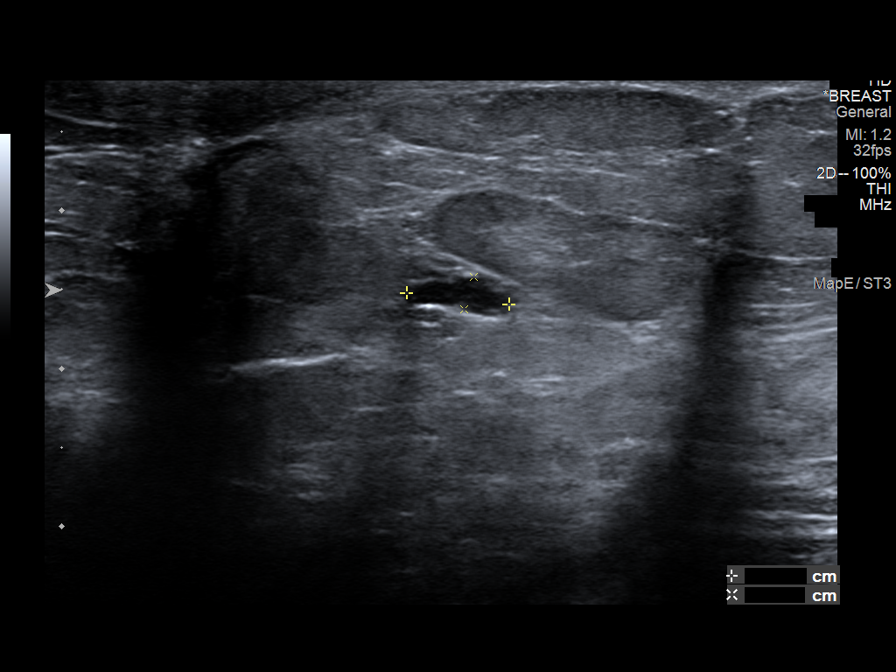
[im 5/9]
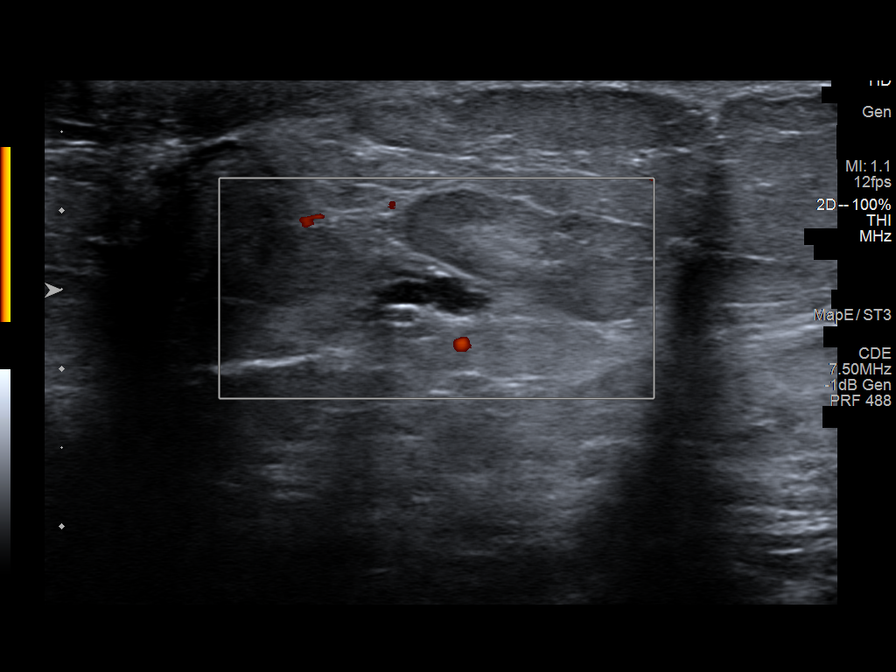
[im 6/9]
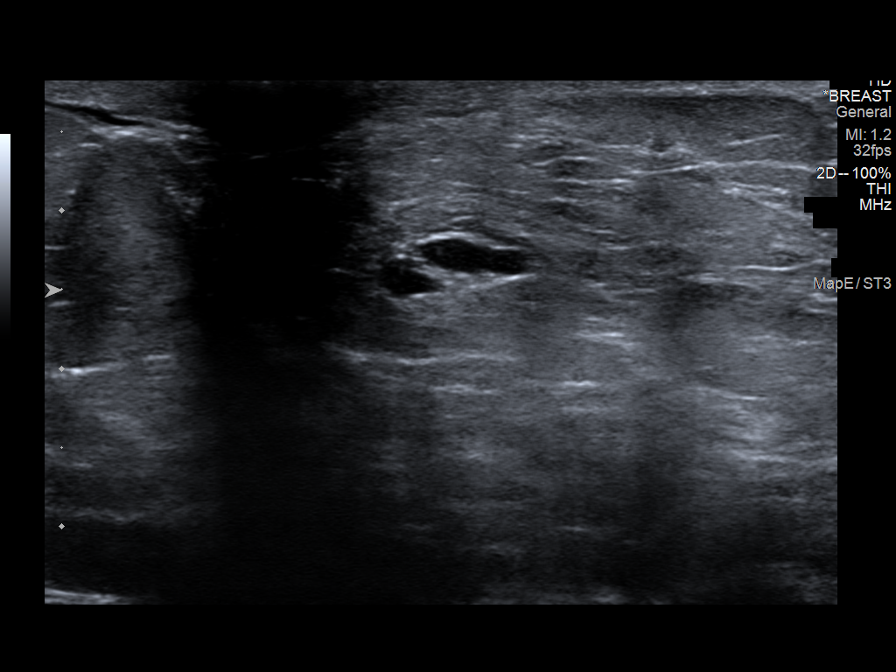
[im 7/9]
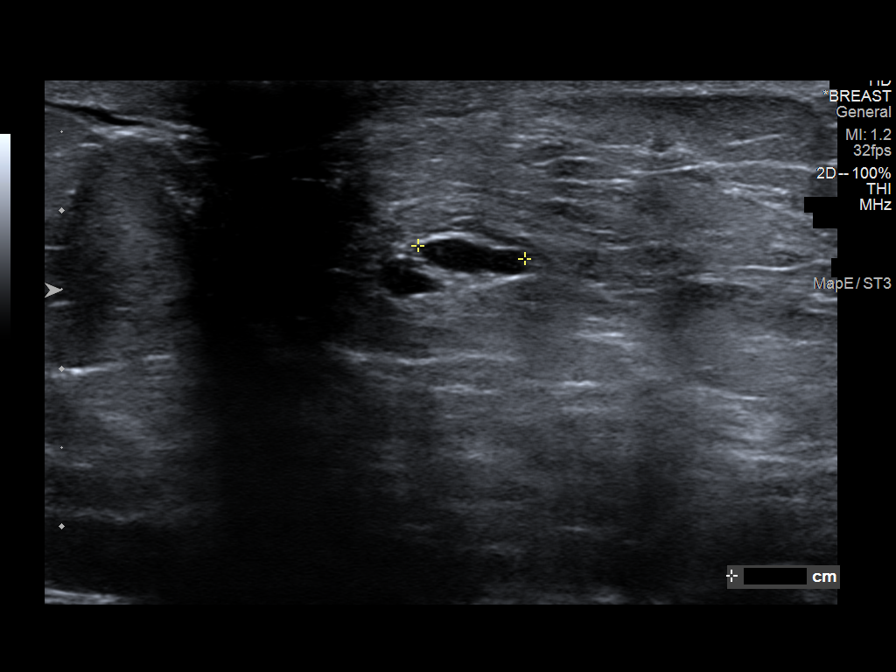
[im 8/9]
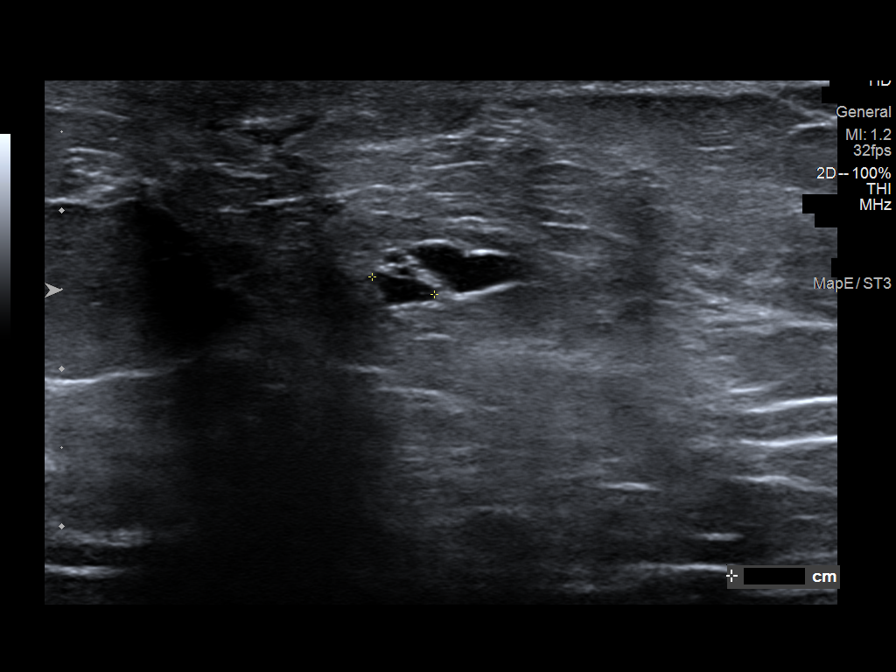
[im 9/9]
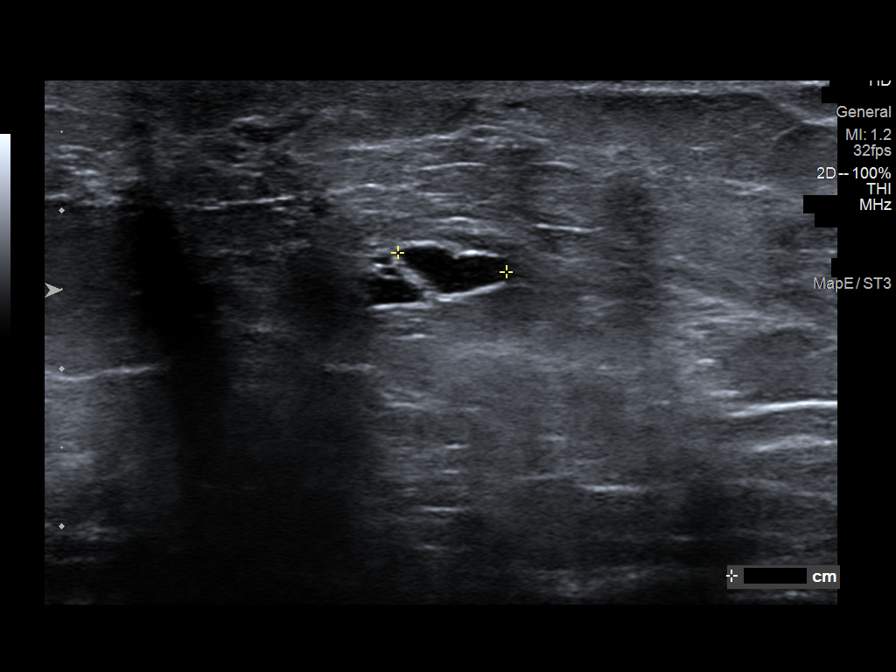

[9 of 9 positions shown; findings below may reference images not displayed]

ACR Breast Density Category b: There are scattered areas of
fibroglandular density.
FINDINGS: 3D tomographic and 2D generated spot compression views of the left
breast confirm a small, oval, circumscribed mass in the lateral
retroareolar region of the breast.

Targeted ultrasound is performed, showing a small cluster of cysts
in the 3 o'clock retroareolar left breast measuring 9 mm in maximum
diameter. This corresponds to the mammographic mass.
IMPRESSION: Benign left breast cluster of cysts.  No evidence of malignancy.

RECOMMENDATION:
Bilateral screening mammogram in 1 year.

I have discussed the findings and recommendations with the patient.
If applicable, a reminder letter will be sent to the patient
regarding the next appointment.

BI-RADS CATEGORY  2: Benign.

## 2021-11-07 ENCOUNTER — Encounter (INDEPENDENT_AMBULATORY_CARE_PROVIDER_SITE_OTHER): Payer: Self-pay | Admitting: Family Medicine

## 2021-11-07 ENCOUNTER — Ambulatory Visit (INDEPENDENT_AMBULATORY_CARE_PROVIDER_SITE_OTHER): Payer: Commercial Managed Care - PPO | Admitting: Family Medicine

## 2021-11-07 ENCOUNTER — Other Ambulatory Visit: Payer: Self-pay

## 2021-11-07 VITALS — BP 134/88 | HR 91 | Temp 97.5°F | Ht 64.0 in | Wt 243.0 lb

## 2021-11-07 DIAGNOSIS — E559 Vitamin D deficiency, unspecified: Secondary | ICD-10-CM | POA: Diagnosis not present

## 2021-11-07 DIAGNOSIS — R030 Elevated blood-pressure reading, without diagnosis of hypertension: Secondary | ICD-10-CM

## 2021-11-07 DIAGNOSIS — Z9189 Other specified personal risk factors, not elsewhere classified: Secondary | ICD-10-CM | POA: Diagnosis not present

## 2021-11-07 DIAGNOSIS — E1169 Type 2 diabetes mellitus with other specified complication: Secondary | ICD-10-CM

## 2021-11-07 DIAGNOSIS — Z6841 Body Mass Index (BMI) 40.0 and over, adult: Secondary | ICD-10-CM

## 2021-11-07 MED ORDER — VITAMIN D (ERGOCALCIFEROL) 1.25 MG (50000 UNIT) PO CAPS: 50000.0000 [IU] | ORAL_CAPSULE | ORAL | 0 refills | Status: DC

## 2021-11-08 NOTE — Progress Notes (Signed)
Chief Complaint:   OBESITY Dawn Downs is here to discuss her progress with her obesity treatment plan along with follow-up of her obesity related diagnoses. Dawn Downs is on the Category 3 Plan and states she is following her eating plan approximately 75% of the time. Dawn Downs states she is walking for 30 minutes 3 times per week.  Today's visit was #: 4 Starting weight: 258 lbs Starting date: 09/20/2021 Today's weight: 243 lbs Today's date: 11/07/2021 Total lbs lost to date: 15 Total lbs lost since last in-office visit: 6  Interim History: Dawn Downs did increase her walking to 3 days per week, as per her goals at her last office visit. She is being very mindful of her choices, and she is able to eat all of the food on her plan. She denies hunger or cravings.  Subjective:   1. Type 2 diabetes mellitus with other specified complication, without long-term current use of insulin (HCC) Dawn Downs's blood sugars were in the 160's when she checked it once.  2. Elevated blood pressure reading Dawn Downs notes her blood pressure is always under great control when checked at other doctor's offices and other appointments. Her blood pressure was 118/78 recently on Monday.  BP Readings from Last 3 Encounters:  11/07/21 134/88  10/24/21 (!) 152/76  10/03/21 138/75   3. Vitamin D deficiency Dawn Downs is currently taking prescription vitamin D 50,000 IU each week. She denies nausea, vomiting or muscle weakness.  4. At risk for hyperglycemia Dawn Downs is at increased risk for hyperglycemia due to a history of hyperglycemic events.  Assessment/Plan:  No orders of the defined types were placed in this encounter.   Medications Discontinued During This Encounter  Medication Reason   Vitamin D, Ergocalciferol, (DRISDOL) 1.25 MG (50000 UNIT) CAPS capsule Reorder     Meds ordered this encounter  Medications   Vitamin D, Ergocalciferol, (DRISDOL) 1.25 MG (50000 UNIT) CAPS capsule    Sig: Take 1  capsule (50,000 Units total) by mouth every 7 (seven) days.    Dispense:  4 capsule    Refill:  0    30 d supply;  ** OV for RF **   Do not send RF request     1. Type 2 diabetes mellitus with other specified complication, without long-term current use of insulin (HCC) Dawn Downs will continue metformin (as she has only been on it for 2 months). She is to obtain a glucometer from her primary care physician. She will continue her prudent nutritional plan and weight loss. Good blood sugar control is important to decrease the likelihood of diabetic complications such as nephropathy, neuropathy, limb loss, blindness, coronary artery disease, and death. Intensive lifestyle modification including diet, exercise and weight loss are the first line of treatment for diabetes.   2. Elevated blood pressure reading I recommended Dawn Downs to obtain a home blood pressure monitor (Omron 3 series), and check her blood pressure 2-3 days per week or so. She is to bring in her number log to her next office visit.  3. Vitamin D deficiency Low Vitamin D level contributes to fatigue and are associated with obesity, breast, and colon cancer. We will refill prescription Vitamin D for 1 month. Dawn Downs will follow-up for routine testing of Vitamin D, at least 2-3 times per year to avoid over-replacement.  - Vitamin D, Ergocalciferol, (DRISDOL) 1.25 MG (50000 UNIT) CAPS capsule; Take 1 capsule (50,000 Units total) by mouth every 7 (seven) days.  Dispense: 4 capsule; Refill: 0  4. At risk for  hyperglycemia Dawn Downs was given approximately 9 minutes of counseling today regarding prevention of hyperglycemia. She was advised of hyperglycemia causes and the fact hyperglycemia is often asymptomatic. Dawn Downs was instructed to avoid skipping meals, eat regular protein rich meals and schedule low calorie but protein rich snacks as needed.   Repetitive spaced learning was employed today to elicit superior memory formation and  behavioral change  5. Obesity with current BMI of 41.8 Dawn Downs is currently in the action stage of change. As such, her goal is to continue with weight loss efforts. She has agreed to the Category 3 Plan.   Dawn Downs is to continue to practice intentional eating.  Exercise goals: As is.  Behavioral modification strategies: increasing lean protein intake, emotional eating strategies, and planning for success.  Dawn Downs has agreed to follow-up with our clinic in 2 weeks. She was informed of the importance of frequent follow-up visits to maximize her success with intensive lifestyle modifications for her multiple health conditions.   Objective:   Blood pressure 134/88, pulse 91, temperature (!) 97.5 F (36.4 C), height 5\' 4"  (1.626 m), weight 243 lb (110.2 kg), SpO2 98 %. Body mass index is 41.71 kg/m.  General: Cooperative, alert, well developed, in no acute distress. HEENT: Conjunctivae and lids unremarkable. Cardiovascular: Regular rhythm.  Lungs: Normal work of breathing. Neurologic: No focal deficits.   Lab Results  Component Value Date   CREATININE 0.57 09/19/2021   BUN 12 09/19/2021   NA 135 09/19/2021   K 4.1 09/19/2021   CL 100 09/19/2021   CO2 26 09/19/2021   Lab Results  Component Value Date   ALT 74 (H) 11/01/2021   AST 41 (H) 11/01/2021   ALKPHOS 62 11/01/2021   BILITOT 0.4 11/01/2021   Lab Results  Component Value Date   HGBA1C 10.3 (H) 08/31/2021   HGBA1C 6.4 (H) 12/21/2020   HGBA1C 5.8 07/17/2020   HGBA1C 5.1 12/17/2019   HGBA1C 4.9 02/04/2019   Lab Results  Component Value Date   INSULIN 27.9 (H) 09/20/2021   Lab Results  Component Value Date   TSH 1.77 09/19/2021   Lab Results  Component Value Date   CHOL 213 (H) 09/19/2021   HDL 33.90 (L) 09/19/2021   LDLCALC 150 (H) 09/19/2021   TRIG 145.0 09/19/2021   CHOLHDL 6 09/19/2021   Lab Results  Component Value Date   VD25OH 18.8 (L) 09/20/2021   Lab Results  Component Value Date   WBC  8.5 09/19/2021   HGB 12.8 09/19/2021   HCT 38.2 09/19/2021   MCV 77.5 (L) 09/19/2021   PLT 330.0 09/19/2021   No results found for: IRON, TIBC, FERRITIN  Attestation Statements:   Reviewed by clinician on day of visit: allergies, medications, problem list, medical history, surgical history, family history, social history, and previous encounter notes.   09/21/2021, am acting as transcriptionist for Trude Mcburney, DO.  I have reviewed the above documentation for accuracy and completeness, and I agree with the above. [employer], D.O.  The 21st Century Cures Act was signed into law in 2016 which includes the topic of electronic health records.  This provides immediate access to information in MyChart.  This includes consultation notes, operative notes, office notes, lab results and pathology reports.  If you have any questions about what you read please let 2017 know at your next visit so we can discuss your concerns and take corrective action if need be.  We are right here with you.

## 2021-11-13 NOTE — Progress Notes (Signed)
  Office: [phone]  /  Fax: [phone]    Date: November 27, 2021   Appointment Start Time: 10:00am Duration: 21 minutes Provider: Lawerance Cruel, Psy.D. Type of Session: Individual Therapy  Location of Patient: [employer]) Location of Provider: Provider's Home (private office) Type of Contact: Telepsychological Visit via MyChart Video Visit  Session Content: Dawn Downs is a 45 y.o. female presenting for a follow-up appointment to address the previously established treatment goal of increasing coping skills.Today's appointment was a telepsychological visit due to COVID-19. [name] provided verbal consent for today's telepsychological appointment and she is aware she is responsible for securing confidentiality on her end of the session. Prior to proceeding with today's appointment, Nikeya's physical location at the time of this appointment was obtained as well a phone number she could be reached at in the event of technical difficulties. Kanai and this provider participated in today's telepsychological service.   This provider conducted a brief check-in. Aniyia stated, "Nothing significant [has happened] since the last time I talked to you." She shared about her recent appointment with Dr. Sharee Holster, noting she lost five pounds. She acknowledged she is not eating enough and expressed concern about developing an unhealthy relationship with eating/exercising and subsequent with weight loss. Explored further and processed. Psychoeducation provided regarding the importance of enough from a metabolism and weight loss standpoint. Shakinah further acknowledged she forgets to eat at times at work. Thus, she was engaged in problem solving to eat more regularly. Psychoeducation regarding the hunger and satisfaction scale was provided. [name] provided verbal consent during today's appointment for this provider to send a handout with the scale via e-mail. She noted a plan to set reminders on  her Outlook calendar and check-in with herself using the aforementioned scale. Overall, Onelia was receptive to today's appointment as evidenced by openness to sharing, responsiveness to feedback, and willingness to implement discussed strategies .  Mental Status Examination:  Appearance: well groomed and appropriate hygiene  Behavior: appropriate to circumstances Mood: euthymic Affect: mood congruent Speech: normal in rate, volume, and tone Eye Contact: appropriate Psychomotor Activity: appropriate Gait: unable to assess Thought Process: linear, logical, and goal directed  Thought Content/Perception: no hallucinations, delusions, bizarre thinking or behavior reported or observed and no evidence or endorsement of suicidal and homicidal ideation, plan, and intent Orientation: time, person, place, and purpose of appointment Memory/Concentration: memory, attention, language, and fund of knowledge intact  Insight/Judgment: fair  Interventions:  Conducted a brief chart review Provided empathic reflections and validation Employed supportive psychotherapy interventions to facilitate reduced distress and to improve coping skills with identified stressors Engaged patient in problem solving Psychoeducation provided regarding the hunger and satisfaction scale  DSM-5 Diagnosis(es): F50.89 Other Specified Feeding or Eating Disorder, Emotional Eating Behaviors  Treatment Goal & Progress: During the initial appointment with this provider, the following treatment goal was established: increase coping skills. Charelle has demonstrated progress in her goal as evidenced by increased awareness of hunger patterns. Jakita also continues to demonstrate willingness to engage in learned skill(s).  Plan: The next appointment will be scheduled in two weeks, which will be via MyChart Video Visit. The next session will focus on working towards the established treatment goal.

## 2021-11-26 ENCOUNTER — Encounter (INDEPENDENT_AMBULATORY_CARE_PROVIDER_SITE_OTHER): Payer: Self-pay | Admitting: Family Medicine

## 2021-11-26 ENCOUNTER — Other Ambulatory Visit: Payer: Self-pay

## 2021-11-26 ENCOUNTER — Ambulatory Visit (INDEPENDENT_AMBULATORY_CARE_PROVIDER_SITE_OTHER): Payer: Commercial Managed Care - PPO | Admitting: Family Medicine

## 2021-11-26 VITALS — BP 135/87 | HR 96 | Temp 97.6°F | Ht 64.0 in | Wt 238.0 lb

## 2021-11-26 DIAGNOSIS — Z6841 Body Mass Index (BMI) 40.0 and over, adult: Secondary | ICD-10-CM | POA: Diagnosis not present

## 2021-11-26 DIAGNOSIS — E1169 Type 2 diabetes mellitus with other specified complication: Secondary | ICD-10-CM

## 2021-11-26 DIAGNOSIS — Z9189 Other specified personal risk factors, not elsewhere classified: Secondary | ICD-10-CM | POA: Diagnosis not present

## 2021-11-27 ENCOUNTER — Telehealth (INDEPENDENT_AMBULATORY_CARE_PROVIDER_SITE_OTHER): Payer: Commercial Managed Care - PPO | Admitting: Psychology

## 2021-11-27 DIAGNOSIS — F5089 Other specified eating disorder: Secondary | ICD-10-CM

## 2021-11-27 NOTE — Progress Notes (Unsigned)
  Office: [phone]  /  Fax: [phone]    Date: December 11, 2021   Appointment Start Time: *** Duration: *** minutes Provider: Lawerance Cruel, Psy.D. Type of Session: Individual Therapy  Location of Patient: {gbptloc:23249} Location of Provider: Provider's Home (private office) Type of Contact: Telepsychological Visit via MyChart Video Visit  Session Content: Dawn Downs is a 45 y.o. female presenting for a follow-up appointment to address the previously established treatment goal of increasing coping skills.Today's appointment was a telepsychological visit due to COVID-19. Dawn Downs provided verbal consent for today's telepsychological appointment and she is aware she is responsible for securing confidentiality on her end of the session. Prior to proceeding with today's appointment, Dawn Downs's physical location at the time of this appointment was obtained as well a phone number she could be reached at in the event of technical difficulties. Dawn Downs and this provider participated in today's telepsychological service.   This provider conducted a brief check-in. *** Dawn Downs was receptive to today's appointment as evidenced by openness to sharing, responsiveness to feedback, and {gbreceptiveness:23401}.  Mental Status Examination:  Appearance: {Appearance:22431} Behavior: {Behavior:22445} Mood: {gbmood:21757} Affect: {Affect:22436} Speech: {Speech:22432} Eye Contact: {Eye Contact:22433} Psychomotor Activity: {Motor Activity:22434} Gait: {gbgait:23404} Thought Process: {thought process:22448}  Thought Content/Perception: {disturbances:22451} Orientation: {Orientation:22437} Memory/Concentration: {gbcognition:22449} Insight/Judgment: {Insight:22446}  Interventions:  {Interventions for Progress Notes:23405}  DSM-5 Diagnosis(es): F50.89 Other Specified Feeding or Eating Disorder, Emotional Eating Behaviors  Treatment Goal & Progress: During the initial appointment with this  provider, the following treatment goal was established: increase coping skills. Dawn Downs has demonstrated progress in her goal as evidenced by {gbtxprogress:22839}. Dawn Downs also {gbtxprogress2:22951}.  Plan: The next appointment will be scheduled in {gbweeks:21758}, which will be {gbtxmodality:23402}. The next session will focus on {Plan for Next Appointment:23400}.

## 2021-11-27 NOTE — Progress Notes (Signed)
Chief Complaint:   OBESITY Dawn Downs is here to discuss her progress with her obesity treatment plan along with follow-up of her obesity related diagnoses. [name] is on the Category 3 Plan and states she is following her eating plan approximately 70% of the time. [name] states she is walking for 30 minutes 3 times per week.  Today's visit was #: 5 Starting weight: 258 lbs Starting date: 09/20/2021 Today's weight: 238 lbs Today's date: 11/26/2021 Total lbs lost to date: 20 Total lbs lost since last in-office visit: 5  Interim History: [name] is not eating enough. She states that she struggles to have all of the food on her meal plan, but she is eating most of her meals. She is drinking 60 oz of water per day on average. [name] is here for a follow up office visit. We reviewed her meal plan and questions were answered.  Patient's food recall appears to be accurate and consistent with what is on plan when she is following it. When eating on plan, her hunger and cravings are well controlled.    Subjective:   1. Type 2 diabetes mellitus with other specified complication, without long-term current use of insulin (HCC) [name] had her A1c done at work and it was 6.8. she is taking metformin. She is going to see her primary care physician on 12/02/2021, and she is having blood work.  2. At risk for constipation [name] is at increased risk for constipation due to inadequate water intake and higher protein meals.  Assessment/Plan:  No orders of the defined types were placed in this encounter.   There are no discontinued medications.   No orders of the defined types were placed in this encounter.    1. Type 2 diabetes mellitus with other specified complication, without long-term current use of insulin (HCC) [name] will continue her medication, prudent nutritional plan and decrease simple carbohydrates. She will continue weight loss, and she will obtain labs here or  with her primary care physician in the near future.  2. At risk for constipation [name] was given approximately 9 minutes of counseling today regarding prevention of constipation. She was encouraged to increase water and fiber intake.   3. Obesity with current BMI of 40.9 [name] is currently in the action stage of change. As such, her goal is to continue with weight loss efforts. She has agreed to the Category 3 Plan.   Exercise goals: As is, increase as tolerated.  Behavioral modification strategies: increasing lean protein intake, decreasing simple carbohydrates, and planning for success.  [name] has agreed to follow-up with our clinic in 2 to 3 weeks. She was informed of the importance of frequent follow-up visits to maximize her success with intensive lifestyle modifications for her multiple health conditions.   Objective:   Blood pressure 135/87, pulse 96, temperature 97.6 F (36.4 C), height 5\' 4"  (1.626 m), weight 238 lb (108 kg), SpO2 100 %. Body mass index is 40.85 kg/m.  General: Cooperative, alert, well developed, in no acute distress. HEENT: Conjunctivae and lids unremarkable. Cardiovascular: Regular rhythm.  Lungs: Normal work of breathing. Neurologic: No focal deficits.   Lab Results  Component Value Date   CREATININE 0.57 09/19/2021   BUN 12 09/19/2021   NA 135 09/19/2021   K 4.1 09/19/2021   CL 100 09/19/2021   CO2 26 09/19/2021   Lab Results  Component Value Date   ALT 74 (H) 11/01/2021   AST 41 (H) 11/01/2021   ALKPHOS 62  11/01/2021   BILITOT 0.4 11/01/2021   Lab Results  Component Value Date   HGBA1C 10.3 (H) 08/31/2021   HGBA1C 6.4 (H) 12/21/2020   HGBA1C 5.8 07/17/2020   HGBA1C 5.1 12/17/2019   HGBA1C 4.9 02/04/2019   Lab Results  Component Value Date   INSULIN 27.9 (H) 09/20/2021   Lab Results  Component Value Date   TSH 1.77 09/19/2021   Lab Results  Component Value Date   CHOL 213 (H) 09/19/2021   HDL 33.90 (L) 09/19/2021    LDLCALC 150 (H) 09/19/2021   TRIG 145.0 09/19/2021   CHOLHDL 6 09/19/2021   Lab Results  Component Value Date   VD25OH 18.8 (L) 09/20/2021   Lab Results  Component Value Date   WBC 8.5 09/19/2021   HGB 12.8 09/19/2021   HCT 38.2 09/19/2021   MCV 77.5 (L) 09/19/2021   PLT 330.0 09/19/2021   No results found for: IRON, TIBC, FERRITIN  Attestation Statements:   Reviewed by clinician on day of visit: allergies, medications, problem list, medical history, surgical history, family history, social history, and previous encounter notes.   Trude Mcburney, am acting as transcriptionist for [employer], DO.  I have reviewed the above documentation for accuracy and completeness, and I agree with the above. [name], D.O.  The 21st Century Cures Act was signed into law in 2016 which includes the topic of electronic health records.  This provides immediate access to information in MyChart.  This includes consultation notes, operative notes, office notes, lab results and pathology reports.  If you have any questions about what you read please let us know at your next visit so we can discuss your concerns and take corrective action if need be.  We are right here with you.

## 2021-12-03 ENCOUNTER — Ambulatory Visit: Payer: Commercial Managed Care - PPO | Admitting: Family Medicine

## 2021-12-03 ENCOUNTER — Encounter: Payer: Self-pay | Admitting: Family Medicine

## 2021-12-03 VITALS — BP 118/78 | HR 99 | Temp 98.1°F | Resp 17 | Wt 237.8 lb

## 2021-12-03 DIAGNOSIS — E119 Type 2 diabetes mellitus without complications: Secondary | ICD-10-CM | POA: Diagnosis not present

## 2021-12-03 DIAGNOSIS — E785 Hyperlipidemia, unspecified: Secondary | ICD-10-CM | POA: Diagnosis not present

## 2021-12-03 DIAGNOSIS — E1169 Type 2 diabetes mellitus with other specified complication: Secondary | ICD-10-CM

## 2021-12-03 LAB — BASIC METABOLIC PANEL
BUN: 18 mg/dL (ref 6–23)
CO2: 25 mEq/L (ref 19–32)
Calcium: 9.4 mg/dL (ref 8.4–10.5)
Chloride: 103 mEq/L (ref 96–112)
Creatinine, Ser: 0.61 mg/dL (ref 0.40–1.20)
GFR: 107.75 mL/min (ref >60.00)
Glucose, Bld: 151 mg/dL — ABNORMAL HIGH (ref 70–99)
Potassium: 4.1 mEq/L (ref 3.5–5.1)
Sodium: 138 mEq/L (ref 135–145)

## 2021-12-03 LAB — HEPATIC FUNCTION PANEL
ALT: 31 U/L (ref 0–35)
AST: 17 U/L (ref 0–37)
Albumin: 4.3 g/dL (ref 3.5–5.2)
Alkaline Phosphatase: 61 U/L (ref 39–117)
Bilirubin, Direct: 0.1 mg/dL (ref 0.0–0.3)
Total Bilirubin: 0.5 mg/dL (ref 0.2–1.2)
Total Protein: 6.8 g/dL (ref 6.0–8.3)

## 2021-12-03 LAB — LIPID PANEL
Cholesterol: 147 mg/dL (ref 0–200)
HDL: 32.2 mg/dL — ABNORMAL LOW (ref >39.00)
LDL Cholesterol: 90 mg/dL (ref 0–99)
NonHDL: 114.61
Total CHOL/HDL Ratio: 5
Triglycerides: 123 mg/dL (ref 0.0–149.0)
VLDL: 24.6 mg/dL (ref 0.0–40.0)

## 2021-12-03 LAB — HEMOGLOBIN A1C: Hgb A1c MFr Bld: 6.8 % — ABNORMAL HIGH (ref 4.6–6.5)

## 2021-12-03 NOTE — Assessment & Plan Note (Signed)
Chronic problem.  Last A1C was 10.3 which was very atypical for pt but she had also gained most of her weight back.  She is now down 30 lbs and her goal is to come off Metformin if possible.  We did discuss Ozempic if her A1C still needs improvement.  Pt to schedule eye exam.  UTD on foot exam and microalbumin.  Check labs.  Adjust meds prn

## 2021-12-03 NOTE — Assessment & Plan Note (Signed)
Pt is down nearly 30 lbs since last visit!  Applauded her efforts at healthy diet and encouraged her to add regular exercise.  Will continue to follow.

## 2021-12-03 NOTE — Assessment & Plan Note (Signed)
Chronic problem.  Tolerating statin w/o difficulty.  Check labs.  Adjust meds prn  

## 2021-12-03 NOTE — Patient Instructions (Addendum)
Follow up in 3-4 months to recheck A1C We'll notify you of your lab results and make any changes if needed Continue to work on healthy diet and regular exercise- you're doing AMAZING!!! Schedule your eye exam at your convenience Call with any questions or concerns Stay Safe!  Stay Healthy! Happy Holidays!!!

## 2021-12-03 NOTE — Progress Notes (Signed)
   Subjective:    Patient ID: Dawn Downs, female    DOB: 1976/08/12, 45 y.o.   MRN: [id number]  HPI DM- chronic problem, on Metformin XR 500mg  BID.  Due for eye exam- pt has not been able to schedule.  Denies CP, SOB, HAs, visual changes, abd pain, N/V.  No symptomatic lows.  No numbness/tingling of hands/feet.  UTD on microalbumin.  UTD on foot exam.  Hyperlipidemia- chronic problem, on Crestor 10mg  daily.  Has lost 30 lbs since last labs.  Obesity- pt is down ~30 lbs since last visit.  BMI now 40.82.  Seeing [facility].  Using prescribed diet as a framework.  Doctor at [facility] did not encourage Ozempic due to hx of pancreatitis- but that was 12 yrs ago and caused by gallstone.   Review of Systems For ROS see HPI   This visit occurred during the SARS-CoV-2 public health emergency.  Safety protocols were in place, including screening questions prior to the visit, additional usage of staff PPE, and extensive cleaning of exam room while observing appropriate contact time as indicated for disinfecting solutions.      Objective:   Physical Exam Vitals reviewed.  Constitutional:      General: She is not in acute distress.    Appearance: Normal appearance. She is well-developed. She is obese.  HENT:     Head: Normocephalic and atraumatic.  Eyes:     Conjunctiva/sclera: Conjunctivae normal.     Pupils: Pupils are equal, round, and reactive to light.  Neck:     Thyroid: No thyromegaly.  Cardiovascular:     Rate and Rhythm: Normal rate and regular rhythm.     Pulses: Normal pulses.     Heart sounds: Normal heart sounds. No murmur heard. Pulmonary:     Effort: Pulmonary effort is normal. No respiratory distress.     Breath sounds: Normal breath sounds.  Abdominal:     General: There is no distension.     Palpations: Abdomen is soft.     Tenderness: There is no abdominal tenderness.  Musculoskeletal:     Cervical back: Normal range of motion and neck supple.     Right lower leg: No edema.      Left lower leg: No edema.  Lymphadenopathy:     Cervical: No cervical adenopathy.  Skin:    General: Skin is warm and dry.  Neurological:     Mental Status: She is alert and oriented to person, place, and time.  Psychiatric:        Behavior: Behavior normal.          Assessment & Plan:

## 2021-12-04 ENCOUNTER — Telehealth: Payer: Self-pay

## 2021-12-04 NOTE — Telephone Encounter (Signed)
Patient is aware of labs via my chart

## 2021-12-10 ENCOUNTER — Encounter (INDEPENDENT_AMBULATORY_CARE_PROVIDER_SITE_OTHER): Payer: Self-pay | Admitting: Family Medicine

## 2021-12-10 ENCOUNTER — Other Ambulatory Visit: Payer: Self-pay

## 2021-12-10 ENCOUNTER — Ambulatory Visit (INDEPENDENT_AMBULATORY_CARE_PROVIDER_SITE_OTHER): Payer: Commercial Managed Care - PPO | Admitting: Family Medicine

## 2021-12-10 VITALS — BP 124/86 | HR 90 | Temp 97.7°F | Ht 64.0 in | Wt 233.0 lb

## 2021-12-10 DIAGNOSIS — Z6841 Body Mass Index (BMI) 40.0 and over, adult: Secondary | ICD-10-CM

## 2021-12-10 DIAGNOSIS — E1169 Type 2 diabetes mellitus with other specified complication: Secondary | ICD-10-CM

## 2021-12-10 DIAGNOSIS — E785 Hyperlipidemia, unspecified: Secondary | ICD-10-CM

## 2021-12-10 DIAGNOSIS — E559 Vitamin D deficiency, unspecified: Secondary | ICD-10-CM | POA: Diagnosis not present

## 2021-12-10 DIAGNOSIS — Z9189 Other specified personal risk factors, not elsewhere classified: Secondary | ICD-10-CM

## 2021-12-10 DIAGNOSIS — K760 Fatty (change of) liver, not elsewhere classified: Secondary | ICD-10-CM

## 2021-12-10 MED ORDER — VITAMIN D (ERGOCALCIFEROL) 1.25 MG (50000 UNIT) PO CAPS: 50000.0000 [IU] | ORAL_CAPSULE | ORAL | 0 refills | Status: DC

## 2021-12-10 NOTE — Progress Notes (Signed)
Chief Complaint:   OBESITY Dawn Downs is here to discuss her progress with her obesity treatment plan along with follow-up of her obesity related diagnoses. Dawn Downs is on the Category 3 Plan and states she is following her eating plan approximately 70-75% of the time. Dawn Downs states she is walking 30 minutes 3 times per week.  Today's visit was #: 6 Starting weight: 258 lbs Starting date: 09/20/2021 Today's weight: 233 lbs Today's date: 12/10/2021 Total lbs lost to date: 25 Total lbs lost since last in-office visit: 5  Interim History: Valari is still struggling with eating all foods on plan. She sets an alarm for water intake every 1.5 hours and drinks 12 oz each time. She denies cravings or hunger even with a lot of candy around her office. Pt has less body and joint aches, especially in her ankles.   Subjective:   1. Type 2 diabetes mellitus with other specified complication, without long-term current use of insulin (HCC) Dawn Downs's A1c went from 10.3 3 months ago to 6.8 a week ago. She has a history of pancreatitis.  2. Hyperlipidemia associated with type 2 diabetes mellitus (HCC) Dawn Downs has hyperlipidemia and has been trying to improve her cholesterol levels with intensive lifestyle modification including a low saturated fat diet, exercise and weight loss. She denies any chest pain, claudication or myalgias. She has been on Crestor for 3 months.  3. Vitamin D deficiency She is currently taking prescription vitamin D 50,000 IU each week. She denies nausea, vomiting or muscle weakness.  4. NAFLD (nonalcoholic fatty liver disease) Pt's ALT and AST have normalized now. Three months ago AST was 55 and ALT 79, and now ALT 31 and AST 17.  5. At risk for deficient intake of food Dawn Downs is at risk for deficient intake of food due to inadequate intake.  Assessment/Plan:   Orders Placed This Encounter  Procedures   VITAMIN D 25 Hydroxy (Vit-D Deficiency, Fractures)     Medications Discontinued During This Encounter  Medication Reason   Vitamin D, Ergocalciferol, (DRISDOL) 1.25 MG (50000 UNIT) CAPS capsule Reorder     Meds ordered this encounter  Medications   Vitamin D, Ergocalciferol, (DRISDOL) 1.25 MG (50000 UNIT) CAPS capsule    Sig: Take 1 capsule (50,000 Units total) by mouth every 7 (seven) days.    Dispense:  4 capsule    Refill:  0    30 d supply;  ** OV for RF **   Do not send RF request     1. Type 2 diabetes mellitus with other specified complication, without long-term current use of insulin (HCC) Excellent control. Continue prudent nutritional plan and weight loss. Good blood sugar control is important to decrease the likelihood of diabetic complications such as nephropathy, neuropathy, limb loss, blindness, coronary artery disease, and death. Intensive lifestyle modification including diet, exercise and weight loss are the first line of treatment for diabetes.   2. Hyperlipidemia associated with type 2 diabetes mellitus (HCC) Pt's LDL went from 150 down to 90. PCP advised her to stay on 10 mg Crestor. Continue prudent nutritional plan and weight loss. Cardiovascular risk and specific lipid/LDL goals reviewed.  We discussed several lifestyle modifications today and Kiauna will continue to work on diet, exercise and weight loss efforts. Orders and follow up as documented in patient record.   Counseling Intensive lifestyle modifications are the first line treatment for this issue. Dietary changes: Increase soluble fiber. Decrease simple carbohydrates. Exercise changes: Moderate to vigorous-intensity aerobic activity 150  minutes per week if tolerated. Lipid-lowering medications: see documented in medical record.  3. Vitamin D deficiency Low Vitamin D level contributes to fatigue and are associated with obesity, breast, and colon cancer. She agrees to continue to take prescription Vitamin D 50,000 IU every week and will follow-up for  routine testing of Vitamin D, at least 2-3 times per year to avoid over-replacement. Check labs today.  Refill- Vitamin D, Ergocalciferol, (DRISDOL) 1.25 MG (50000 UNIT) CAPS capsule; Take 1 capsule (50,000 Units total) by mouth every 7 (seven) days.  Dispense: 4 capsule; Refill: 0  - VITAMIN D 25 Hydroxy (Vit-D Deficiency, Fractures)  4. NAFLD (nonalcoholic fatty liver disease) Continue prudent nutritional plan and weight loss.  5. At risk for deficient intake of food Dawn Downs was given approximately 9 minutes of deficit intake of food prevention counseling today. Dawn Downs is at risk for eating too few calories based on current food recall. She was encouraged to focus on meeting caloric and protein goals according to her recommended meal plan.   6. Obesity with current BMI of 40.0  Dawn Downs is currently in the action stage of change. As such, her goal is to continue with weight loss efforts. She has agreed to the Category 3 Plan.   Handout: Holiday Meals  Strategies discussed with pt. Labs reviewed from PCP office- all vastly improved from prior.  Exercise goals:  As is  Behavioral modification strategies: no skipping meals, holiday eating strategies , and planning for success.  Dawn Downs has agreed to follow-up with our clinic in 2-3 weeks. She was informed of the importance of frequent follow-up visits to maximize her success with intensive lifestyle modifications for her multiple health conditions.   Objective:   Blood pressure 124/86, pulse 90, temperature 97.7 F (36.5 C), height 5\' 4"  (1.626 m), weight 233 lb (105.7 kg), SpO2 95 %. Body mass index is 39.99 kg/m.  General: Cooperative, alert, well developed, in no acute distress. HEENT: Conjunctivae and lids unremarkable. Cardiovascular: Regular rhythm.  Lungs: Normal work of breathing. Neurologic: No focal deficits.   Lab Results  Component Value Date   CREATININE 0.61 12/03/2021   BUN 18 12/03/2021   NA 138  12/03/2021   K 4.1 12/03/2021   CL 103 12/03/2021   CO2 25 12/03/2021   Lab Results  Component Value Date   ALT 31 12/03/2021   AST 17 12/03/2021   ALKPHOS 61 12/03/2021   BILITOT 0.5 12/03/2021   Lab Results  Component Value Date   HGBA1C 6.8 (H) 12/03/2021   HGBA1C 10.3 (H) 08/31/2021   HGBA1C 6.4 (H) 12/21/2020   HGBA1C 5.8 07/17/2020   HGBA1C 5.1 12/17/2019   Lab Results  Component Value Date   INSULIN 27.9 (H) 09/20/2021   Lab Results  Component Value Date   TSH 1.77 09/19/2021   Lab Results  Component Value Date   CHOL 147 12/03/2021   HDL 32.20 (L) 12/03/2021   LDLCALC 90 12/03/2021   TRIG 123.0 12/03/2021   CHOLHDL 5 12/03/2021   Lab Results  Component Value Date   VD25OH 18.8 (L) 09/20/2021   Lab Results  Component Value Date   WBC 8.5 09/19/2021   HGB 12.8 09/19/2021   HCT 38.2 09/19/2021   MCV 77.5 (L) 09/19/2021   PLT 330.0 09/19/2021    Attestation Statements:   Reviewed by clinician on day of visit: allergies, medications, problem list, medical history, surgical history, family history, social history, and previous encounter notes.  09/21/2021, CMA, am acting  as transcriptionist for [employer], DO.  I have reviewed the above documentation for accuracy and completeness, and I agree with the above. Dawn Downs, D.O.  The [place] was signed into law in 2016 which includes the topic of electronic health records.  This provides immediate access to information in MyChart.  This includes consultation notes, operative notes, office notes, lab results and pathology reports.  If you have any questions about what you read please let us know at your next visit so we can discuss your concerns and take corrective action if need be.  We are right here with you.

## 2021-12-11 ENCOUNTER — Encounter (INDEPENDENT_AMBULATORY_CARE_PROVIDER_SITE_OTHER): Payer: Self-pay

## 2021-12-11 ENCOUNTER — Other Ambulatory Visit (INDEPENDENT_AMBULATORY_CARE_PROVIDER_SITE_OTHER): Payer: Self-pay | Admitting: Family Medicine

## 2021-12-11 ENCOUNTER — Telehealth (INDEPENDENT_AMBULATORY_CARE_PROVIDER_SITE_OTHER): Payer: Commercial Managed Care - PPO | Admitting: Psychology

## 2021-12-11 DIAGNOSIS — E559 Vitamin D deficiency, unspecified: Secondary | ICD-10-CM

## 2021-12-11 LAB — VITAMIN D 25 HYDROXY (VIT D DEFICIENCY, FRACTURES): Vit D, 25-Hydroxy: 55.3 ng/mL (ref 30.0–100.0)

## 2021-12-12 NOTE — Telephone Encounter (Signed)
LOV w/ Dr. O

## 2021-12-13 ENCOUNTER — Other Ambulatory Visit: Payer: Self-pay | Admitting: Family Medicine

## 2021-12-13 DIAGNOSIS — E1165 Type 2 diabetes mellitus with hyperglycemia: Secondary | ICD-10-CM

## 2021-12-25 NOTE — Progress Notes (Addendum)
°  Office: [phone]  /  Fax: [phone]    Date: January 07, 2022   Appointment Start Time: 8:01am Duration: 33 minutes Provider: Lawerance Cruel, Psy.D. Type of Session: Individual Therapy  Location of Patient: Home (private room) Location of Provider: Provider's Home (private office) Type of Contact: Telepsychological Visit via MyChart Video Visit  Session Content: Dawn Downs is a 45 y.o. female presenting for a follow-up appointment to address the previously established treatment goal of increasing coping skills.Today's appointment was a telepsychological visit due to COVID-19. [name] provided verbal consent for today's telepsychological appointment and she is aware she is responsible for securing confidentiality on her end of the session. Prior to proceeding with today's appointment, [name]'s physical location at the time of this appointment was obtained as well a phone number she could be reached at in the event of technical difficulties. Panhia and this provider participated in today's telepsychological service.   This provider conducted a brief check-in. Darbie shared about recent events. She indicated eating was "okay" during the holidays, but acknowledged challenges with sweets at work. Further explored and processed. Psychoeducation provided regarding self-compassion. [name] was engaged in a self-compassion exercise to help with eating-related challenges and other ongoing stressors. She was encouraged to regularly ask herself, What do I need right now? [name] was receptive to today's appointment as evidenced by openness to sharing, responsiveness to feedback, and willingness to work toward increasing self-compassion.   Mental Status Examination:  Appearance: well groomed and appropriate hygiene  Behavior: appropriate to circumstances Mood: sad Affect: mood congruent; tearful at times Speech: WNL Eye Contact: appropriate Psychomotor Activity: WNL Gait: unable to  assess Thought Process: linear, logical, and goal directed and no evidence or endorsement of suicidal, homicidal, and self-harm ideation, plan and intent  Thought Content/Perception: no hallucinations, delusions, bizarre thinking or behavior endorsed or observed Orientation: AAOx4 Memory/Concentration: memory, attention, language, and fund of knowledge intact  Insight/Judgment: fair  Interventions:  Conducted a brief chart review Provided empathic reflections and validation Employed supportive psychotherapy interventions to facilitate reduced distress and to improve coping skills with identified stressors Psychoeducation provided regarding self-compassion Engaged pt in a self-compassion exercise  DSM-5 Diagnosis(es): F50.89 Other Specified Feeding or Eating Disorder, Emotional Eating Behaviors  Treatment Goal & Progress: During the initial appointment with this provider, the following treatment goal was established: increase coping skills. [name] has demonstrated progress in her goal as evidenced by increased awareness of hunger patterns. [name] also continues to demonstrate willingness to engage in learned skill(s).  Plan: The next appointment will be scheduled in two weeks, which will be via MyChart Video Visit. The next session will focus on working towards the established treatment goal. [name] will continue to meet with her primary therapist.

## 2022-01-01 ENCOUNTER — Encounter (INDEPENDENT_AMBULATORY_CARE_PROVIDER_SITE_OTHER): Payer: Self-pay | Admitting: Family Medicine

## 2022-01-01 ENCOUNTER — Ambulatory Visit (INDEPENDENT_AMBULATORY_CARE_PROVIDER_SITE_OTHER): Payer: Commercial Managed Care - PPO | Admitting: Family Medicine

## 2022-01-01 ENCOUNTER — Other Ambulatory Visit: Payer: Self-pay

## 2022-01-01 VITALS — BP 122/85 | HR 94 | Temp 98.1°F | Ht 64.0 in | Wt 232.0 lb

## 2022-01-01 DIAGNOSIS — Z9189 Other specified personal risk factors, not elsewhere classified: Secondary | ICD-10-CM

## 2022-01-01 DIAGNOSIS — E559 Vitamin D deficiency, unspecified: Secondary | ICD-10-CM

## 2022-01-01 DIAGNOSIS — E1169 Type 2 diabetes mellitus with other specified complication: Secondary | ICD-10-CM

## 2022-01-01 DIAGNOSIS — Z6839 Body mass index (BMI) 39.0-39.9, adult: Secondary | ICD-10-CM | POA: Diagnosis not present

## 2022-01-01 MED ORDER — VITAMIN D (ERGOCALCIFEROL) 1.25 MG (50000 UNIT) PO CAPS: 50000.0000 [IU] | ORAL_CAPSULE | ORAL | 0 refills | Status: DC

## 2022-01-03 NOTE — Progress Notes (Signed)
Chief Complaint:   OBESITY Dawn Downs is here to discuss her progress with her obesity treatment plan along with follow-up of her obesity related diagnoses. Dawn Downs is on the Category 3 Plan and states she is following her eating plan approximately 75% of the time. Dawn Downs states she walked the last couple of days for 45-60 minutes.  Today's visit was #: 7 Starting weight: 258 lbs Starting date: 09/20/2021 Today's weight: 232 lbs Today's date: 01/01/2022 Total lbs lost to date: 26 Total lbs lost since last in-office visit: 1  Interim History: Dawn Downs was successful with losing weight over the holidays because she was "mindful". She also moves more. She has no issues with the plan and she denies hunger or cravings.  Subjective:   1. Vitamin D deficiency Dawn Downs is currently taking prescription vitamin D 50,000 IU each week. She denies nausea, vomiting or muscle weakness.  2. Type 2 diabetes mellitus with other specified complication, without long-term current use of insulin (HCC) Dawn Downs is not checking her blood sugars at home, but she states that "she really feels good". She is tolerating metformin well with no side effects.  3. At risk for constipation Dawn Downs is at increased risk for constipation due to inadequate water intake (40-60 oz of water per day only).  Assessment/Plan:  No orders of the defined types were placed in this encounter.   Medications Discontinued During This Encounter  Medication Reason   Vitamin D, Ergocalciferol, (DRISDOL) 1.25 MG (50000 UNIT) CAPS capsule Reorder     Meds ordered this encounter  Medications   Vitamin D, Ergocalciferol, (DRISDOL) 1.25 MG (50000 UNIT) CAPS capsule    Sig: Take 1 capsule (50,000 Units total) by mouth every 7 (seven) days.    Dispense:  4 capsule    Refill:  0    30 d supply;  ** OV for RF **   Do not send RF request     1. Vitamin D deficiency We will refill prescription Vitamin D for 1 month. Dawn Downs will  follow-up for routine testing of Vitamin D, at least 2-3 times per year to avoid over-replacement.  - Vitamin D, Ergocalciferol, (DRISDOL) 1.25 MG (50000 UNIT) CAPS capsule; Take 1 capsule (50,000 Units total) by mouth every 7 (seven) days.  Dispense: 4 capsule; Refill: 0  2. Type 2 diabetes mellitus with other specified complication, without long-term current use of insulin (HCC) Good blood sugar control is important to decrease the likelihood of diabetic complications such as nephropathy, neuropathy, limb loss, blindness, coronary artery disease, and death. Intensive lifestyle modification including diet, exercise and weight loss are the first line of treatment for diabetes.   3. At risk for constipation Dawn Downs is at increased risk for constipation due to inadequate water intake, changes in diet, and/or use of certain medications. Patient was provided with 8 minutes of counseling today regarding this condition and related ones.  We discussed preventative OTC therapies that exist such as MiraLAX, stool softeners, etc.   We also discussed the importance of adequate fiber intake and for patient to drink 1/2 weight in ounces of water per day, unless told otherwise by a Cardiologist, Nephrologist, or another medical provider.    4. Obesity BMI today is 25 Dawn Downs is currently in the action stage of change. As such, her goal is to continue with weight loss efforts. She has agreed to the Category 3 Plan.   Dawn Downs is to continue to work on increasing her water intake.  Exercise goals: As  is, but increase her activity to 45-60 minutes 3 days per week.  Behavioral modification strategies: increasing lean protein intake, decreasing simple carbohydrates, avoiding temptations, and planning for success.  Dawn Downs has agreed to follow-up with our clinic in 3 weeks. She was informed of the importance of frequent follow-up visits to maximize her success with intensive lifestyle modifications for  her multiple health conditions.   Objective:   Blood pressure 122/85, pulse 94, temperature 98.1 F (36.7 C), height 5\' 4"  (1.626 m), weight 232 lb (105.2 kg), SpO2 99 %. Body mass index is 39.82 kg/m.  General: Cooperative, alert, well developed, in no acute distress. HEENT: Conjunctivae and lids unremarkable. Cardiovascular: Regular rhythm.  Lungs: Normal work of breathing. Neurologic: No focal deficits.   Lab Results  Component Value Date   CREATININE 0.61 12/03/2021   BUN 18 12/03/2021   NA 138 12/03/2021   K 4.1 12/03/2021   CL 103 12/03/2021   CO2 25 12/03/2021   Lab Results  Component Value Date   ALT 31 12/03/2021   AST 17 12/03/2021   ALKPHOS 61 12/03/2021   BILITOT 0.5 12/03/2021   Lab Results  Component Value Date   HGBA1C 6.8 (H) 12/03/2021   HGBA1C 10.3 (H) 08/31/2021   HGBA1C 6.4 (H) 12/21/2020   HGBA1C 5.8 07/17/2020   HGBA1C 5.1 12/17/2019   Lab Results  Component Value Date   INSULIN 27.9 (H) 09/20/2021   Lab Results  Component Value Date   TSH 1.77 09/19/2021   Lab Results  Component Value Date   CHOL 147 12/03/2021   HDL 32.20 (L) 12/03/2021   LDLCALC 90 12/03/2021   TRIG 123.0 12/03/2021   CHOLHDL 5 12/03/2021   Lab Results  Component Value Date   VD25OH 55.3 12/10/2021   VD25OH 18.8 (L) 09/20/2021   Lab Results  Component Value Date   WBC 8.5 09/19/2021   HGB 12.8 09/19/2021   HCT 38.2 09/19/2021   MCV 77.5 (L) 09/19/2021   PLT 330.0 09/19/2021   No results found for: IRON, TIBC, FERRITIN  Attestation Statements:   Reviewed by clinician on day of visit: allergies, medications, problem list, medical history, surgical history, family history, social history, and previous encounter notes.   09/21/2021, am acting as transcriptionist for Trude Mcburney, DO.  I have reviewed the above documentation for accuracy and completeness, and I agree with the above. [employer], D.O.  The 21st Century Cures Act was  signed into law in 2016 which includes the topic of electronic health records.  This provides immediate access to information in MyChart.  This includes consultation notes, operative notes, office notes, lab results and pathology reports.  If you have any questions about what you read please let 2017 know at your next visit so we can discuss your concerns and take corrective action if need be.  We are right here with you.

## 2022-01-07 ENCOUNTER — Telehealth (INDEPENDENT_AMBULATORY_CARE_PROVIDER_SITE_OTHER): Payer: Commercial Managed Care - PPO | Admitting: Psychology

## 2022-01-07 DIAGNOSIS — F5089 Other specified eating disorder: Secondary | ICD-10-CM | POA: Diagnosis not present

## 2022-01-07 NOTE — Progress Notes (Signed)
°  Office: [phone]  /  Fax: [phone]    Date: January 21, 2022   Appointment Start Time: 9:32am Duration: 26 minutes Provider: Lawerance Cruel, Psy.D. Type of Session: Individual Therapy  Location of Patient: [employer]) Location of Provider: Provider's Home (private office) Type of Contact: Telepsychological Visit via MyChart Video Visit  Session Content: Dawn Downs is a 46 y.o. female presenting for a follow-up appointment to address the previously established treatment goal of increasing coping skills.Today's appointment was a telepsychological visit due to COVID-19. Dawn Downs provided verbal consent for today's telepsychological appointment and she is aware she is responsible for securing confidentiality on her end of the session. Prior to proceeding with today's appointment, Dawn Downs's physical location at the time of this appointment was obtained as well a phone number she could be reached at in the event of technical difficulties. Dawn Downs and this provider participated in today's telepsychological service.   This provider conducted a brief check-in. Dawn Downs shared, "I've been a lot nicer to myself." Further explored and processed. She reflected giving herself grace and "letting go" allowed her to make better choices and engage in portion control during recent celebrations. Positive reinforcement was provided. Moreover, psychoeducation regarding triggers for emotional eating was provided. Dawn Downs was provided a handout, and encouraged to utilize the handout between now and the next appointment to increase awareness of triggers and frequency. Dawn Downs agreed. This provider also discussed behavioral strategies for specific triggers, such as placing the utensil down when conversing to avoid mindless eating. Dawn Downs provided verbal consent during today's appointment for this provider to send a handout about triggers via e-mail.  Dawn Downs was receptive to today's appointment as  evidenced by openness to sharing, responsiveness to feedback, and willingness to explore triggers for emotional eating.  Mental Status Examination:  Appearance: well groomed and appropriate hygiene  Behavior: appropriate to circumstances Mood: neutral Affect: mood congruent Speech: WNL Eye Contact: appropriate Psychomotor Activity: WNL Gait: unable to assess Thought Process: no evidence or endorsement of suicidal, homicidal, and self-harm ideation, plan and intent  Thought Content/Perception: no hallucinations, delusions, bizarre thinking or behavior endorsed or observed Orientation: AAOx4 Memory/Concentration: memory, attention, language, and fund of knowledge intact  Insight/Judgment: fair  Interventions:  Conducted a brief chart review Provided empathic reflections and validation Employed supportive psychotherapy interventions to facilitate reduced distress and to improve coping skills with identified stressors Psychoeducation provided regarding triggers for emotional eating behaviors  DSM-5 Diagnosis(es): F50.89 Other Specified Feeding or Eating Disorder, Emotional Eating Behaviors  Treatment Goal & Progress: During the initial appointment with this provider, the following treatment goal was established: increase coping skills. Dawn Downs has demonstrated progress in her goal as evidenced by increased awareness of hunger patterns. Dawn Downs also continues to demonstrate willingness to engage in learned skill(s).  Plan: The next appointment will be scheduled in two weeks, which will be via MyChart Video Visit. The next session will focus on working towards the established treatment goal.

## 2022-01-21 ENCOUNTER — Encounter (INDEPENDENT_AMBULATORY_CARE_PROVIDER_SITE_OTHER): Payer: Self-pay | Admitting: Family Medicine

## 2022-01-21 ENCOUNTER — Ambulatory Visit (INDEPENDENT_AMBULATORY_CARE_PROVIDER_SITE_OTHER): Payer: Commercial Managed Care - PPO | Admitting: Family Medicine

## 2022-01-21 ENCOUNTER — Other Ambulatory Visit: Payer: Self-pay

## 2022-01-21 ENCOUNTER — Telehealth (INDEPENDENT_AMBULATORY_CARE_PROVIDER_SITE_OTHER): Payer: Commercial Managed Care - PPO | Admitting: Psychology

## 2022-01-21 VITALS — BP 124/81 | HR 101 | Temp 97.7°F | Ht 64.0 in | Wt 230.0 lb

## 2022-01-21 DIAGNOSIS — E559 Vitamin D deficiency, unspecified: Secondary | ICD-10-CM | POA: Diagnosis not present

## 2022-01-21 DIAGNOSIS — Z9189 Other specified personal risk factors, not elsewhere classified: Secondary | ICD-10-CM

## 2022-01-21 DIAGNOSIS — E669 Obesity, unspecified: Secondary | ICD-10-CM | POA: Diagnosis not present

## 2022-01-21 DIAGNOSIS — E7849 Other hyperlipidemia: Secondary | ICD-10-CM | POA: Diagnosis not present

## 2022-01-21 DIAGNOSIS — F5089 Other specified eating disorder: Secondary | ICD-10-CM

## 2022-01-21 DIAGNOSIS — E1169 Type 2 diabetes mellitus with other specified complication: Secondary | ICD-10-CM

## 2022-01-21 DIAGNOSIS — Z6839 Body mass index (BMI) 39.0-39.9, adult: Secondary | ICD-10-CM

## 2022-01-21 MED ORDER — VITAMIN D (ERGOCALCIFEROL) 1.25 MG (50000 UNIT) PO CAPS: 50000.0000 [IU] | ORAL_CAPSULE | ORAL | 0 refills | Status: DC

## 2022-01-21 NOTE — Progress Notes (Signed)
°  Office: [phone]  /  Fax: [phone]    Date: February 04, 2022   Appointment Start Time: 8:01am Duration: 26 minutes Provider: Lawerance Cruel, Psy.D. Type of Session: Individual Therapy  Location of Patient: Work (private location) Location of Provider: Provider's Home (private office) Type of Contact: Telepsychological Visit via MyChart Video Visit  Session Content: Dawn Downs is a 46 y.o. female presenting for a follow-up appointment to address the previously established treatment goal of increasing coping skills.Today's appointment was a telepsychological visit due to COVID-19. Dawn Downs provided verbal consent for today's telepsychological appointment and she is aware she is responsible for securing confidentiality on her end of the session. Prior to proceeding with today's appointment, Dawn Downs physical location at the time of this appointment was obtained as well a phone number she could be reached at in the event of technical difficulties. Dawn Downs and this provider participated in today's telepsychological service. Of note, today's appointment was switched to a regular telephone call at 8:16am with Dawn Downs's verbal consent due to technical issues.   This provider conducted a brief check-in. Dawn Downs shared, "I've been working a whole lot." She acknowledged eating cake during a retirement party at work. Associated thoughts and feelings were processed. Reviewed triggers for emotional eating behaviors. Psychoeducation regarding mindfulness was provided. A handout was provided to [facility] with further information regarding mindfulness, including exercises. This provider also explained the benefit of mindfulness as it relates to emotional eating. Dawn Downs was encouraged to engage in the provided exercises between now and the next appointment with this provider. Dawn Downs agreed. During today's appointment, Dawn Downs was led through a mindfulness exercise involving her senses. Dawn Downs provided  verbal consent during today's appointment for this provider to send a handout about mindfulness via e-mail. Overall, Dawn Downs was receptive to today's appointment as evidenced by openness to sharing, responsiveness to feedback, and willingness to engage in mindfulness exercises to assist with coping.  Mental Status Examination:  Appearance: well groomed and appropriate hygiene  Behavior: appropriate to circumstances Mood: neutral Affect: mood congruent Speech: WNL Eye Contact: appropriate Psychomotor Activity: WNL Gait: unable to assess Thought Process: linear, logical, and goal directed and no evidence or endorsement of suicidal, homicidal, and self-harm ideation, plan and intent  Thought Content/Perception: no hallucinations, delusions, bizarre thinking or behavior endorsed or observed Orientation: AAOx4 Memory/Concentration: memory, attention, language, and fund of knowledge intact  Insight/Judgment: fair  Interventions:  Conducted a brief chart review Provided empathic reflections and validation Reviewed content from the previous session Employed supportive psychotherapy interventions to facilitate reduced distress and to improve coping skills with identified stressors Psychoeducation provided regarding mindfulness Engaged patient in mindfulness exercise(s)  DSM-5 Diagnosis(es): F50.89 Other Specified Feeding or Eating Disorder, Emotional Eating Behaviors  Treatment Goal & Progress: During the initial appointment with this provider, the following treatment goal was established: increase coping skills. Dawn Downs has demonstrated progress in her goal as evidenced by increased awareness of hunger patterns and increased awareness of triggers for emotional eating behaviors. Dawn Downs also continues to demonstrate willingness to engage in learned skill(s).  Plan: Based on Dawn Downs's schedule, the next appointment will be scheduled in 3-4 weeks, which will be via MyChart Video Visit. The next  session will focus on working towards the established treatment goal.

## 2022-01-21 NOTE — Progress Notes (Signed)
Chief Complaint:   OBESITY Dawn Downs is here to discuss her progress with her obesity treatment plan along with follow-up of her obesity related diagnoses. Dawn Downs is on the Category 3 Plan and states she is following her eating plan approximately 60% of the time. Dawn Downs states she is walking 45 minutes 3 times per week.  Today's visit was #: 8 Starting weight: 258 lbs Starting date: 09/20/2021 Today's weight: 230 lbs Today's date: 01/21/2022 Total lbs lost to date: 28 Total lbs lost since last in-office visit: 2  Interim History: Pt had 2 birthday parties over both past weekends, with pizza and cake. She is happy with her weight loss today. She has increased her water intake from 60 oz to 100 oz per day and increased exercise to 3 days a week from 2 days.  Subjective:   1. Vitamin D deficiency She is currently taking prescription vitamin D 50,000 IU each week. She denies nausea, vomiting or muscle weakness.  2. Type 2 diabetes mellitus with other specified complication, without long-term current use of insulin (HCC) Pt hasn't checked her BS lately but feels fine. She denies carb cravings or hunger. Medication: Metformin XR  3. Other hyperlipidemia Dawn Downs has hyperlipidemia and has been trying to improve her cholesterol levels with intensive lifestyle modification including a low saturated fat diet, exercise and weight loss. She denies any chest pain, claudication or myalgias. Medication: Crestor qhs  4. At risk for hypoglycemia Dawn Downs is at increased risk for hypoglycemia as she continues to loss weight.  Assessment/Plan:  No orders of the defined types were placed in this encounter.   Medications Discontinued During This Encounter  Medication Reason   Vitamin D, Ergocalciferol, (DRISDOL) 1.25 MG (50000 UNIT) CAPS capsule Reorder     Meds ordered this encounter  Medications   Vitamin D, Ergocalciferol, (DRISDOL) 1.25 MG (50000 UNIT) CAPS capsule    Sig: Take 1  capsule (50,000 Units total) by mouth every 7 (seven) days.    Dispense:  4 capsule    Refill:  0    30 d supply;  ** OV for RF **   Do not send RF request     1. Vitamin D deficiency Low Vitamin D level contributes to fatigue and are associated with obesity, breast, and colon cancer. She agrees to continue to take prescription Vitamin D 50,000 IU every week and will follow-up for routine testing of Vitamin D, at least 2-3 times per year to avoid over-replacement.  Refill- Vitamin D, Ergocalciferol, (DRISDOL) 1.25 MG (50000 UNIT) CAPS capsule; Take 1 capsule (50,000 Units total) by mouth every 7 (seven) days.  Dispense: 4 capsule; Refill: 0  2. Type 2 diabetes mellitus with other specified complication, without long-term current use of insulin (HCC) Good blood sugar control is important to decrease the likelihood of diabetic complications such as nephropathy, neuropathy, limb loss, blindness, coronary artery disease, and death. Intensive lifestyle modification including diet, exercise and weight loss are the first line of treatment for diabetes. A1c UTD and at goal.  3. Other hyperlipidemia FLP and CMP UTD. LDL at goal. Pt will continue Crestor, increase exercise, and follow prudent nutritional plan.  4. At risk for hypoglycemia Dawn Downs was given approximately 9 minutes of counseling today regarding prevention of hypoglycemia. She was advised of symptoms of hypoglycemia. Dawn Downs was instructed to avoid skipping meals, eat regular protein rich meals and schedule low calorie snacks as needed.   5. Obesity with current BMI of 39.6  Dawn Downs is currently  in the action stage of change. As such, her goal is to continue with weight loss efforts. She has agreed to the Category 3 Plan.   Exercise goals:  As is- Add weight lifting 2 days a week.  Behavioral modification strategies: increasing lean protein intake, decreasing simple carbohydrates, and planning for success.  Dawn Downs has agreed to  follow-up with our clinic in 3-4 weeks. She was informed of the importance of frequent follow-up visits to maximize her success with intensive lifestyle modifications for her multiple health conditions.   Objective:   Blood pressure 124/81, pulse (!) 101, temperature 97.7 F (36.5 C), height 5\' 4"  (1.626 m), weight 230 lb (104.3 kg), SpO2 99 %. Body mass index is 39.48 kg/m.  General: Cooperative, alert, well developed, in no acute distress. HEENT: Conjunctivae and lids unremarkable. Cardiovascular: Regular rhythm.  Lungs: Normal work of breathing. Neurologic: No focal deficits.   Lab Results  Component Value Date   CREATININE 0.61 12/03/2021   BUN 18 12/03/2021   NA 138 12/03/2021   K 4.1 12/03/2021   CL 103 12/03/2021   CO2 25 12/03/2021   Lab Results  Component Value Date   ALT 31 12/03/2021   AST 17 12/03/2021   ALKPHOS 61 12/03/2021   BILITOT 0.5 12/03/2021   Lab Results  Component Value Date   HGBA1C 6.8 (H) 12/03/2021   HGBA1C 10.3 (H) 08/31/2021   HGBA1C 6.4 (H) 12/21/2020   HGBA1C 5.8 07/17/2020   HGBA1C 5.1 12/17/2019   Lab Results  Component Value Date   INSULIN 27.9 (H) 09/20/2021   Lab Results  Component Value Date   TSH 1.77 09/19/2021   Lab Results  Component Value Date   CHOL 147 12/03/2021   HDL 32.20 (L) 12/03/2021   LDLCALC 90 12/03/2021   TRIG 123.0 12/03/2021   CHOLHDL 5 12/03/2021   Lab Results  Component Value Date   VD25OH 55.3 12/10/2021   VD25OH 18.8 (L) 09/20/2021   Lab Results  Component Value Date   WBC 8.5 09/19/2021   HGB 12.8 09/19/2021   HCT 38.2 09/19/2021   MCV 77.5 (L) 09/19/2021   PLT 330.0 09/19/2021    Attestation Statements:   Reviewed by clinician on day of visit: allergies, medications, problem list, medical history, surgical history, family history, social history, and previous encounter notes.  09/21/2021, CMA, am acting as transcriptionist for Edmund Hilda, DO.  I have reviewed the above  documentation for accuracy and completeness, and I agree with the above. [employer], D.O.  The 21st Century Cures Act was signed into law in 2016 which includes the topic of electronic health records.  This provides immediate access to information in MyChart.  This includes consultation notes, operative notes, office notes, lab results and pathology reports.  If you have any questions about what you read please let 2017 know at your next visit so we can discuss your concerns and take corrective action if need be.  We are right here with you.

## 2022-02-04 ENCOUNTER — Telehealth (INDEPENDENT_AMBULATORY_CARE_PROVIDER_SITE_OTHER): Payer: Commercial Managed Care - PPO | Admitting: Psychology

## 2022-02-04 DIAGNOSIS — F5089 Other specified eating disorder: Secondary | ICD-10-CM

## 2022-02-18 ENCOUNTER — Ambulatory Visit (INDEPENDENT_AMBULATORY_CARE_PROVIDER_SITE_OTHER): Payer: Commercial Managed Care - PPO | Admitting: Family Medicine

## 2022-02-18 ENCOUNTER — Encounter (INDEPENDENT_AMBULATORY_CARE_PROVIDER_SITE_OTHER): Payer: Self-pay | Admitting: Family Medicine

## 2022-02-18 ENCOUNTER — Other Ambulatory Visit: Payer: Self-pay

## 2022-02-18 VITALS — BP 128/75 | HR 103 | Temp 98.1°F | Ht 64.0 in | Wt 231.0 lb

## 2022-02-18 DIAGNOSIS — F32A Depression, unspecified: Secondary | ICD-10-CM

## 2022-02-18 DIAGNOSIS — E1169 Type 2 diabetes mellitus with other specified complication: Secondary | ICD-10-CM

## 2022-02-18 DIAGNOSIS — Z9189 Other specified personal risk factors, not elsewhere classified: Secondary | ICD-10-CM | POA: Diagnosis not present

## 2022-02-18 DIAGNOSIS — E669 Obesity, unspecified: Secondary | ICD-10-CM

## 2022-02-18 DIAGNOSIS — E66813 Obesity, class 3: Secondary | ICD-10-CM

## 2022-02-18 DIAGNOSIS — Z6839 Body mass index (BMI) 39.0-39.9, adult: Secondary | ICD-10-CM

## 2022-02-18 DIAGNOSIS — E559 Vitamin D deficiency, unspecified: Secondary | ICD-10-CM

## 2022-02-18 NOTE — Progress Notes (Signed)
Chief Complaint:   OBESITY Dawn Downs is here to discuss her progress with her obesity treatment plan along with follow-up of her obesity related diagnoses. Dawn Downs is on the Category 3 Plan and states she is following her eating plan approximately 50% of the time. Dawn Downs states she is increasing walking.  Today's visit was #: 9 Starting weight: 258 lbs Starting date: 09/20/2021 Today's weight: 231 lbs Today's date: 02/18/2022 Total lbs lost to date: 27 Total lbs lost since last in-office visit: 0  Interim History: Dawn Downs reports increased stress in life with financial burdens lately and has had more emotional eating than usual. She has an upcoming appt with Dr. Dewaine Conger on 03/05/2022.  Subjective:   1. Type 2 diabetes mellitus with other specified complication, without long-term current use of insulin (HCC) Pt is not checking BS but is asymptomatic and without concerns. She is on Metformin and tolerating it well.  2. Depression: Emotional eating Dawn Downs is sleeping well and her mood is under good control with Wellbutrin, despite stressors lately.  3. Vitamin D deficiency Dawn Downs is tolerating medication(s) well without side effects.  Medication compliance is good and patient appears to be taking it as prescribed.  The patient denies additional concerns regarding this condition.   4. At risk for anxiety Dawn Downs is at risk of developing anxiety due to increased stressors.  Assessment/Plan:   Medications Discontinued During This Encounter  Medication Reason   Vitamin D, Ergocalciferol, (DRISDOL) 1.25 MG (50000 UNIT) CAPS capsule Reorder     Meds ordered this encounter  Medications   Vitamin D, Ergocalciferol, (DRISDOL) 1.25 MG (50000 UNIT) CAPS capsule    Sig: Take 1 capsule (50,000 Units total) by mouth every 7 (seven) days.    Dispense:  4 capsule    Refill:  0    30 d supply;  ** OV for RF **   Do not send RF request      1. Type 2 diabetes mellitus with other  specified complication, without long-term current use of insulin (HCC) Discussed GLP-1 use with pt. Pt declines medication at this time, but will consider it in the future. Risks and benefits of these meds reviewed with pt.  2. Depression: Emotional eating Mindful eating strategies discussed with pt and handouts given. F/u with Dr. Dewaine Conger and continue Wellbutrin.  3. Vitamin D deficiency Low Vitamin D level contributes to fatigue and are associated with obesity, breast, and colon cancer. She agrees to continue to take prescription Vitamin D 50,000 IU every week and will follow-up for routine testing of Vitamin D, at least 2-3 times per year to avoid over-replacement. No refill needed at this time. RF med  4. At risk for anxiety Dawn Downs was given approximately 10 minutes of anxiety risk counseling today. The patient has risk factors for this condition.  We discussed the importance of a healthy work/life balance, a healthy relationship with food, and a good support system.  We discussed various strategies to help cope with these emotions as well.  I recommended counseling, meditation / prayer, healthy eating habits, sleep hygiene, and exercising to help manage these feelings.   5. Obesity with current BMI of 39.8 Dawn Downs is currently in the action stage of change. As such, her goal is to continue with weight loss efforts. She has agreed to the Category 3 Plan.   Emotional eating strategies discussed with pt. Stress management techniques reviewed, such as meditation or increased exercise.  Exercise goals:  As is- Continue to  increase walking as tolerated.  Behavioral modification strategies: emotional eating strategies and avoiding temptations.  Dawn Downs has agreed to follow-up with our clinic in 2-3 weeks. She was informed of the importance of frequent follow-up visits to maximize her success with intensive lifestyle modifications for her multiple health conditions.     Objective:    Blood pressure 128/75, pulse (!) 103, temperature 98.1 F (36.7 C), temperature source Oral, height 5\' 4"  (1.626 m), weight 231 lb (104.8 kg), SpO2 95 %. Body mass index is 39.65 kg/m.  General: Cooperative, alert, well developed, in no acute distress. HEENT: Conjunctivae and lids unremarkable. Cardiovascular: Regular rhythm.  Lungs: Normal work of breathing. Neurologic: No focal deficits.   Lab Results  Component Value Date   CREATININE 0.61 12/03/2021   BUN 18 12/03/2021   NA 138 12/03/2021   K 4.1 12/03/2021   CL 103 12/03/2021   CO2 25 12/03/2021   Lab Results  Component Value Date   ALT 31 12/03/2021   AST 17 12/03/2021   ALKPHOS 61 12/03/2021   BILITOT 0.5 12/03/2021   Lab Results  Component Value Date   HGBA1C 6.8 (H) 12/03/2021   HGBA1C 10.3 (H) 08/31/2021   HGBA1C 6.4 (H) 12/21/2020   HGBA1C 5.8 07/17/2020   HGBA1C 5.1 12/17/2019   Lab Results  Component Value Date   INSULIN 27.9 (H) 09/20/2021   Lab Results  Component Value Date   TSH 1.77 09/19/2021   Lab Results  Component Value Date   CHOL 147 12/03/2021   HDL 32.20 (L) 12/03/2021   LDLCALC 90 12/03/2021   TRIG 123.0 12/03/2021   CHOLHDL 5 12/03/2021   Lab Results  Component Value Date   VD25OH 55.3 12/10/2021   VD25OH 18.8 (L) 09/20/2021   Lab Results  Component Value Date   WBC 8.5 09/19/2021   HGB 12.8 09/19/2021   HCT 38.2 09/19/2021   MCV 77.5 (L) 09/19/2021   PLT 330.0 09/19/2021    Attestation Statements:   Reviewed by clinician on day of visit: allergies, medications, problem list, medical history, surgical history, family history, social history, and previous encounter notes.  09/21/2021, CMA, am acting as transcriptionist for Edmund Hilda, DO.  I have reviewed the above documentation for accuracy and completeness, and I agree with the above. [employer], D.O.  The 21st Century Cures Act was signed into law in 2016 which includes the topic of  electronic health records.  This provides immediate access to information in MyChart.  This includes consultation notes, operative notes, office notes, lab results and pathology reports.  If you have any questions about what you read please let 2017 know at your next visit so we can discuss your concerns and take corrective action if need be.  We are right here with you.

## 2022-02-19 MED ORDER — VITAMIN D (ERGOCALCIFEROL) 1.25 MG (50000 UNIT) PO CAPS: 50000.0000 [IU] | ORAL_CAPSULE | ORAL | 0 refills | Status: DC

## 2022-02-19 NOTE — Progress Notes (Signed)
?  Office: [phone]  /  Fax: [phone] ? ? ? ?Date: March 05, 2022   ?Appointment Start Time: 8:02am ?Duration: 24 minutes ?Provider: Lawerance Cruel, Psy.D. ?Type of Session: Individual Therapy  ?Location of Patient: Work [employer]) ?Location of Provider: Provider's Home (private office) ?Type of Contact: Telepsychological Visit via MyChart Video Visit ? ?Session Content: Dawn Downs is a 46 y.o. female presenting for a follow-up appointment to address the previously established treatment goal of increasing coping skills.Today's appointment was a telepsychological visit due to COVID-19. Dawn Downs provided verbal consent for today's telepsychological appointment and she is aware she is responsible for securing confidentiality on her end of the session. Prior to proceeding with today's appointment, Dawn Downs's physical location at the time of this appointment was obtained as well a phone number she could be reached at in the event of technical difficulties. Dawn Downs and this provider participated in today's telepsychological service. Of note, today's appointment was switched to a regular telephone call at 8:04am with Dawn Downs's verbal consent due to technical issues.  ? ?This provider conducted a brief check-in. Dawn Downs shared ongoing stressors, including "working a lot." She acknowledged she has "gotten back into some emotional eating patterns." Further explored and processed. She acknowledged "not paying attention" to the choices she is making. She was engaged in problem solving to assist with work days. Despite work days, Dawn Downs acknowledged engagement in emotional eating behaviors secondary to other stressors. Psychoeducation regarding the importance of self-care utilizing the oxygen mask metaphor was provided. Psychoeducation regarding pleasurable activities, including its impact on emotional eating and overall well-being was also provided. Dawn Downs was provided with a handout with various options of  pleasurable activities, and was encouraged to engage in one activity a day and additional activities as needed when triggered to emotionally eat. Dawn Downs agreed. Dawn Downs provided verbal consent during today's appointment for this provider to send a handout with pleasurable activities via e-mail. Briefly reviewed self-compassion. Overall, Dawn Downs was receptive to today's appointment as evidenced by openness to sharing, responsiveness to feedback, and willingness to implement discussed strategies . ? ?Mental Status Examination:  ?Appearance: neat ?Behavior: appropriate to circumstances ?Mood: neutral ?Affect: mood congruent ?Speech: WNL ?Eye Contact: appropriate ?Psychomotor Activity: WNL ?Gait: unable to assess ?Thought Process: linear, logical, and goal directed and no evidence or endorsement of suicidal, homicidal, and self-harm ideation, plan and intent  ?Thought Content/Perception: no hallucinations, delusions, bizarre thinking or behavior endorsed or observed ?Orientation: AAOx4 ?Memory/Concentration: memory, attention, language, and fund of knowledge intact  ?Insight: fair ?Judgment: fair ? ?Interventions:  ?Conducted a brief chart review ?Provided empathic reflections and validation ?Employed supportive psychotherapy interventions to facilitate reduced distress and to improve coping skills with identified stressors ?Engaged patient in problem solving ?Psychoeducation provided regarding pleasurable activities ?Psychoeducation provided regarding self-care ? ?DSM-5 Diagnosis(es): F50.89 Other Specified Feeding or Eating Disorder, Emotional Eating Behaviors ? ?Treatment Goal & Progress: During the initial appointment with this provider, the following treatment goal was established: increase coping skills. Dawn Downs has demonstrated progress in her goal as evidenced by increased awareness of hunger patterns and increased awareness of triggers for emotional eating behaviors. Dawn Downs also continues to demonstrate  willingness to engage in learned skill(s). ? ?Plan: The next appointment will be scheduled in two weeks, which will be via MyChart Video Visit. The next session will focus on working towards the established treatment goal. ? ?

## 2022-03-02 ENCOUNTER — Other Ambulatory Visit: Payer: Self-pay | Admitting: Family Medicine

## 2022-03-02 DIAGNOSIS — E1169 Type 2 diabetes mellitus with other specified complication: Secondary | ICD-10-CM

## 2022-03-02 DIAGNOSIS — E785 Hyperlipidemia, unspecified: Secondary | ICD-10-CM

## 2022-03-04 ENCOUNTER — Ambulatory Visit (INDEPENDENT_AMBULATORY_CARE_PROVIDER_SITE_OTHER): Payer: Commercial Managed Care - PPO | Admitting: Family Medicine

## 2022-03-04 ENCOUNTER — Encounter: Payer: Self-pay | Admitting: Family Medicine

## 2022-03-04 VITALS — BP 122/74 | HR 87 | Temp 97.7°F | Resp 16 | Wt 230.2 lb

## 2022-03-04 DIAGNOSIS — E119 Type 2 diabetes mellitus without complications: Secondary | ICD-10-CM

## 2022-03-04 DIAGNOSIS — E559 Vitamin D deficiency, unspecified: Secondary | ICD-10-CM

## 2022-03-04 LAB — BASIC METABOLIC PANEL
BUN: 16 mg/dL (ref 6–23)
CO2: 25 mEq/L (ref 19–32)
Calcium: 9.3 mg/dL (ref 8.4–10.5)
Chloride: 104 mEq/L (ref 96–112)
Creatinine, Ser: 0.67 mg/dL (ref 0.40–1.20)
GFR: 105.16 mL/min (ref >60.00)
Glucose, Bld: 157 mg/dL — ABNORMAL HIGH (ref 70–99)
Potassium: 4.6 mEq/L (ref 3.5–5.1)
Sodium: 141 mEq/L (ref 135–145)

## 2022-03-04 LAB — VITAMIN D 25 HYDROXY (VIT D DEFICIENCY, FRACTURES): VITD: 36.1 ng/mL (ref 30.00–100.00)

## 2022-03-04 LAB — HEMOGLOBIN A1C: Hgb A1c MFr Bld: 7 % — ABNORMAL HIGH (ref 4.6–6.5)

## 2022-03-04 NOTE — Patient Instructions (Signed)
Schedule your complete physical in 3-4 months ?We'll notify you of your lab results and make any changes if needed ?Continue to work on healthy diet and regular exercise- you can do it! ?Schedule your eye exam at your convenience and have them send me a copy ?Call with any questions or concerns ?Stay Safe!  Stay Healthy!! ?Happy [employer] [place] [employer]!!! ?

## 2022-03-04 NOTE — Assessment & Plan Note (Signed)
Pt has lost 7 lbs since last visit in December.  Applauded her efforts- especially since she reports the last month has been incredibly stressful.  Encouraged continued efforts at low carb diet and regular exercise.  Will continue to follow. ?

## 2022-03-04 NOTE — Assessment & Plan Note (Signed)
Chronic problem.  Currently on Metformin XR 500mg  BID.  UTD on foot exam and microalbumin.  Plans to schedule eye exam next month- requested a copy.  Currently asymptomatic.  Check labs.  Adjust meds prn. ?

## 2022-03-04 NOTE — Progress Notes (Signed)
? ?  Subjective:  ? ? Patient ID: Dawn Downs, female    DOB: 1976-09-10, 46 y.o.   MRN: [id number] ? ?HPI ?DM- chronic problem, on Metformin XR 500mg  BID.  UTD on foot exam, microalbumin.  Due for eye exam.  Down 7 lbs since last visit.  Pt admits there were a few weeks that she was not taking meds regularly due to increased stress.  Pt has had 1 symptomatic low but she thinks it was b/c she doubled her Metformin dose one morning.  No CP, SOB, HAs, visual changes.  No numbness/tingling of hands/feet. ? ?Obesity- pt is down another 7 lbs since last visit.  BMI now 39.51.  Reports she has been under considerable financial stress the last month she she is very proud that she has been able to maintain rather than fall back into bad habits. ? ?Vit D deficiency- pt is currently taking 50,000 units weekly.  No additional OTC supplements.  Level 3 months ago was 55.3 but prior to that, 18.8 ? ? ?Review of Systems ?For ROS see HPI  ? ?This visit occurred during the SARS-CoV-2 public health emergency.  Safety protocols were in place, including screening questions prior to the visit, additional usage of staff PPE, and extensive cleaning of exam room while observing appropriate contact time as indicated for disinfecting solutions.   ?   ?Objective:  ? Physical Exam ? ? ? ? ?   ?Assessment & Plan:  ? ? ?

## 2022-03-04 NOTE — Assessment & Plan Note (Signed)
Pt is currently on prescription weekly dose.  Check labs and determine if additional OTC supplement is required.  Will follow. ?

## 2022-03-05 ENCOUNTER — Telehealth (INDEPENDENT_AMBULATORY_CARE_PROVIDER_SITE_OTHER): Payer: Commercial Managed Care - PPO | Admitting: Psychology

## 2022-03-05 ENCOUNTER — Telehealth: Payer: Self-pay

## 2022-03-05 DIAGNOSIS — F5089 Other specified eating disorder: Secondary | ICD-10-CM

## 2022-03-05 NOTE — Telephone Encounter (Signed)
-----   Message from Sheliah Hatch, MD sent at 03/05/2022  7:31 AM EST ----- ?A1C is up just slightly to 7.  Continue to work on [employer], regular exercise, and take meds as directed.  No changes at this time ?

## 2022-03-05 NOTE — Progress Notes (Incomplete)
°  Office: [phone]  /  Fax: [phone]    Date: 03/19/2022   Appointment Start Time: *** Duration: *** minutes Provider: Glennie Isle, Psy.D. Type of Session: Individual Therapy  Location of Patient: {gbptloc:23249} Location of Provider: Provider's Home (private office) Type of Contact: Telepsychological Visit via MyChart Video Visit  Session Content: Dawn Downs is a 46 y.o. female presenting for a follow-up appointment to address the previously established treatment goal of increasing coping skills.Today's appointment was a telepsychological visit due to COVID-19. Dawn Downs provided verbal consent for today's telepsychological appointment and she is aware she is responsible for securing confidentiality on her end of the session. Prior to proceeding with today's appointment, Dawn Downs's physical location at the time of this appointment was obtained as well a phone number she could be reached at in the event of technical difficulties. Dawn Downs and this provider participated in today's telepsychological service.   This provider conducted a brief check-in. *** [name] was receptive to today's appointment as evidenced by openness to sharing, responsiveness to feedback, and {gbreceptiveness:23401}.  Mental Status Examination:  Appearance: {Appearance:22431} Behavior: {Behavior:22445} Mood: {gbmood:21757} Affect: {Affect:22436} Speech: {Speech:22432} Eye Contact: {Eye Contact:22433} Psychomotor Activity: {Motor Activity:22434} Gait: {gbgait:23404} Thought Process: {thought process:22448}  Thought Content/Perception: {disturbances:22451} Orientation: {Orientation:22437} Memory/Concentration: {gbcognition:22449} Insight: {Insight:22446} Judgment: {Insight:22446}  Interventions:  {Interventions for Progress Notes:23405}  DSM-5 Diagnosis(es): F50.89 Other Specified Feeding or Eating Disorder, Emotional Eating Behaviors  Treatment Goal & Progress: During the initial appointment with  this provider, the following treatment goal was established: increase coping skills. [name] has demonstrated progress in her goal as evidenced by {gbtxprogress:22839}. [name] also {gbtxprogress2:22951}.  Plan: The next appointment will be scheduled in {gbweeks:21758}, which will be {gbtxmodality:23402}. The next session will focus on {Plan for Next Appointment:23400}.

## 2022-03-05 NOTE — Telephone Encounter (Signed)
Patient aware of labs, invited her to give me a call back if she had any further questions ?

## 2022-03-11 ENCOUNTER — Encounter (INDEPENDENT_AMBULATORY_CARE_PROVIDER_SITE_OTHER): Payer: Self-pay | Admitting: Family Medicine

## 2022-03-11 ENCOUNTER — Ambulatory Visit (INDEPENDENT_AMBULATORY_CARE_PROVIDER_SITE_OTHER): Payer: Commercial Managed Care - PPO | Admitting: Family Medicine

## 2022-03-11 ENCOUNTER — Other Ambulatory Visit: Payer: Self-pay

## 2022-03-11 VITALS — BP 140/83 | HR 88 | Temp 97.9°F | Ht 64.0 in | Wt 231.0 lb

## 2022-03-11 DIAGNOSIS — E669 Obesity, unspecified: Secondary | ICD-10-CM | POA: Diagnosis not present

## 2022-03-11 DIAGNOSIS — E1169 Type 2 diabetes mellitus with other specified complication: Secondary | ICD-10-CM

## 2022-03-11 DIAGNOSIS — Z6839 Body mass index (BMI) 39.0-39.9, adult: Secondary | ICD-10-CM | POA: Diagnosis not present

## 2022-03-11 DIAGNOSIS — Z9189 Other specified personal risk factors, not elsewhere classified: Secondary | ICD-10-CM

## 2022-03-11 DIAGNOSIS — E559 Vitamin D deficiency, unspecified: Secondary | ICD-10-CM | POA: Diagnosis not present

## 2022-03-11 MED ORDER — TIRZEPATIDE 2.5 MG/0.5ML ~~LOC~~ SOAJ: 2.5000 mg | SUBCUTANEOUS | 0 refills | Status: DC

## 2022-03-11 NOTE — Progress Notes (Signed)
Chief Complaint:   OBESITY Dawn Downs is here to discuss her progress with her obesity treatment plan along with follow-up of her obesity related diagnoses. [name] is on the Category 3 Plan and states she is following her eating plan approximately 50% of the time. [name] states she is not exercising regularly at this time.  Today's visit was #: 10 Starting weight: 258 lbs Starting date: 09/20/2021 Today's weight: 231 lbs Today's date: 03/11/2022 Total lbs lost to date: 27 lbs Total lbs lost since last in-office visit: 0  Interim History: Dairy says she worked 65 hours at her part-time job last week - she is s Merchandiser, retail at the box office at the [employer]  She gained 1+ lbs in muscle mass and lost almost 2 lbs in fat mass.  She recently had labs with her PCP (CMP, A1c, vitamin D).   Subjective:   1. Type 2 diabetes mellitus with other specified complication, without long-term current use of insulin (HCC) Discussed labs with pt. Dawn Downs's recent A1c was 7.0.    She is not checking her blood sugar.  "When I do eat, I eat too much and not the right things."  Especially when not at work, she can skip meals.  No family history of thyroid cancer.  Positive history of gallbladder stone and subsequent pancreatitis.    She would like to start medication for DM that would help with wt loss at this time.   2. Vitamin D deficiency Discussed labs with patient today.  Her vitamin D level 7 days ago was 36.1 at her PCP's office.   - Ergocalciferol Intake has been sporadic the past 1-2 months.   3. At risk for side effect of medication Samanta is at risk for side effect of medication due to starting [facility] and history of gallstone pancreatitis (history of cholecystectomy).    Assessment/Plan:   1. Type 2 diabetes mellitus with other specified complication, without long-term current use of insulin (HCC) A1c not at goal Risks/ benefits of various medications discussed with  patient.  She desires to start Mounjaro 2.5 mg weekly. ( Continue metformin.)   She understands that she is at higher risk of pancreatitis due to her history of it (due to gallstones several yrs ago) and still desires medication as she feels the benefit outweighs the risks.   - Will monitor closely.  - Start tirzepatide [facility]) 2.5 MG/0.5ML Pen; Inject 2.5 mg into the skin once a week.  Dispense: 2 mL; Refill: 0   2. Vitamin D deficiency Not at goal.   - She will try to take as prescribed and be consistent.  Continue to take regularly and recheck in 3 months.  Counseling done.   3. At risk for side effect of medication [name] was given approximately 9 minutes of drug side effect counseling today.  We discussed side effect possibility and risk versus benefits. Dawn Downs agreed to the medication and will contact this office if these side effects are intolerable.  Repetitive spaced learning was employed today to elicit superior memory formation and behavioral change.   4. Obesity with current BMI of 39.7  [name] is currently in the action stage of change. As such, her goal is to continue with weight loss efforts. She has agreed to the Category 3 Plan.   Exercise goals: All adults should avoid inactivity. Some physical activity is better than none, and adults who participate in any amount of physical activity gain some health benefits.  Behavioral modification strategies: increasing  lean protein intake, decreasing simple carbohydrates, avoiding temptations, and planning for success.  [name] has agreed to follow-up with our clinic in 2 weeks. She was informed of the importance of frequent follow-up visits to maximize her success with intensive lifestyle modifications for her multiple health conditions.    Objective:   Blood pressure 140/83, pulse 88, temperature 97.9 F (36.6 C), height 5\' 4"  (1.626 m), weight 231 lb (104.8 kg), SpO2 98 %. Body mass index is 39.65 kg/m.  General:  Cooperative, alert, well developed, in no acute distress. HEENT: Conjunctivae and lids unremarkable. Cardiovascular: Regular rhythm.  Lungs: Normal work of breathing. Neurologic: No focal deficits.   Lab Results  Component Value Date   CREATININE 0.67 03/04/2022   BUN 16 03/04/2022   NA 141 03/04/2022   K 4.6 03/04/2022   CL 104 03/04/2022   CO2 25 03/04/2022   Lab Results  Component Value Date   ALT 31 12/03/2021   AST 17 12/03/2021   ALKPHOS 61 12/03/2021   BILITOT 0.5 12/03/2021   Lab Results  Component Value Date   HGBA1C 7.0 (H) 03/04/2022   HGBA1C 6.8 (H) 12/03/2021   HGBA1C 10.3 (H) 08/31/2021   HGBA1C 6.4 (H) 12/21/2020   HGBA1C 5.8 07/17/2020   Lab Results  Component Value Date   INSULIN 27.9 (H) 09/20/2021   Lab Results  Component Value Date   TSH 1.77 09/19/2021   Lab Results  Component Value Date   CHOL 147 12/03/2021   HDL 32.20 (L) 12/03/2021   LDLCALC 90 12/03/2021   TRIG 123.0 12/03/2021   CHOLHDL 5 12/03/2021   Lab Results  Component Value Date   VD25OH 36.10 03/04/2022   VD25OH 55.3 12/10/2021   VD25OH 18.8 (L) 09/20/2021   Lab Results  Component Value Date   WBC 8.5 09/19/2021   HGB 12.8 09/19/2021   HCT 38.2 09/19/2021   MCV 77.5 (L) 09/19/2021   PLT 330.0 09/19/2021   Attestation Statements:   Reviewed by clinician on day of visit: allergies, medications, problem list, medical history, surgical history, family history, social history, and previous encounter notes.  I, Insurance claims handler, CMA, am acting as Energy manager for [employer], DO.  I have reviewed the above documentation for accuracy and completeness, and I agree with the above. [name], D.O.  The 21st Century Cures Act was signed into law in 2016 which includes the topic of electronic health records.  This provides immediate access to information in MyChart.  This includes consultation notes, operative notes, office notes, lab results and pathology  reports.  If you have any questions about what you read please let us know at your next visit so we can discuss your concerns and take corrective action if need be.  We are right here with you.

## 2022-03-15 ENCOUNTER — Other Ambulatory Visit: Payer: Self-pay | Admitting: Family Medicine

## 2022-03-19 ENCOUNTER — Telehealth (INDEPENDENT_AMBULATORY_CARE_PROVIDER_SITE_OTHER): Payer: Commercial Managed Care - PPO | Admitting: Psychology

## 2022-03-19 DIAGNOSIS — F5089 Other specified eating disorder: Secondary | ICD-10-CM | POA: Diagnosis not present

## 2022-03-19 NOTE — Progress Notes (Signed)
?  Office: [phone]  /  Fax: [phone] ? ? ? ?Date: 04/02/2022   ?Appointment Start Time: 8:03am ?Duration: 24 minutes ?Provider: Lawerance Cruel, Psy.D. ?Type of Session: Individual Therapy  ?Location of Patient: Work (private location) ?Location of Provider: Provider's Home (private office) ?Type of Contact: Telepsychological Visit via MyChart Video Visit ? ?Session Content: Dawn Downs is a 46 y.o. female presenting for a follow-up appointment to address the previously established treatment goal of increasing coping skills.Today's appointment was a telepsychological visit due to COVID-19. Dawn Downs provided verbal consent for today's telepsychological appointment and she is aware she is responsible for securing confidentiality on her end of the session. Prior to proceeding with today's appointment, Dawn Downs's physical location at the time of this appointment was obtained as well a phone number she could be reached at in the event of technical difficulties. Dawn Downs and this provider participated in today's telepsychological service. Of note, today's appointment was switched to a regular telephone call at 8:10am with [name]'s verbal consent due to technical issues on her end.  ? ?This provider conducted a brief check-in. Dawn Downs shared she continues to work "a lot," noting she is making sure to "carve out time" for herself. She added, "Eating has been okay." Dawn Downs described a reduction in emotional eating behaviors, noting she is preparing snacks/meals ahead of time. To further assist with coping, Dawn Downs was engaged in problem solving to develop a plan to help cope with urges/cravings involving activities to relax, activities to distract, comforting places, people to call and connect with, and activities that help soothe senses. Furthermore, termination planning was discussed. Dawn Downs was receptive to a follow-up appointment in 3-4 weeks and an additional follow-up/termination appointment in 3-4 weeks after  that. Overall, Dawn Downs was receptive to today's appointment as evidenced by openness to sharing, responsiveness to feedback, and willingness to implement discussed strategies . ? ?Mental Status Examination:  ?Appearance: neat ?Behavior: appropriate to circumstances ?Mood: neutral ?Affect: mood congruent ?Speech: WNL ?Eye Contact: appropriate ?Psychomotor Activity: WNL ?Gait: unable to assess ?Thought Process: linear, logical, and goal directed and no evidence or endorsement of suicidal, homicidal, and self-harm ideation, plan and intent  ?Thought Content/Perception: no hallucinations, delusions, bizarre thinking or behavior endorsed or observed ?Orientation: AAOx4 ?Memory/Concentration: memory, attention, language, and fund of knowledge intact  ?Insight: good ?Judgment: good ? ?Interventions:  ?Conducted a brief chart review ?Provided empathic reflections and validation ?Employed supportive psychotherapy interventions to facilitate reduced distress and to improve coping skills with identified stressors ?Engaged patient in problem solving ?Discussed termination planning  ? ?DSM-5 Diagnosis(es): F50.89 Other Specified Feeding or Eating Disorder, Emotional Eating Behaviors ? ?Treatment Goal & Progress: During the initial appointment with this provider, the following treatment goal was established: increase coping skills. Dawn Downs has demonstrated progress in her goal as evidenced by increased awareness of hunger patterns, increased awareness of triggers for emotional eating behaviors, and reduction in emotional eating behaviors . Dawn Downs also continues to demonstrate willingness to engage in learned skill(s). ? ?Plan: The next appointment will be scheduled in 3-4 weeks, which will be via MyChart Video Visit. The next session will focus on working towards the established treatment goal. ? ?

## 2022-03-25 ENCOUNTER — Encounter (INDEPENDENT_AMBULATORY_CARE_PROVIDER_SITE_OTHER): Payer: Self-pay | Admitting: Family Medicine

## 2022-03-25 ENCOUNTER — Other Ambulatory Visit: Payer: Self-pay

## 2022-03-25 ENCOUNTER — Ambulatory Visit (INDEPENDENT_AMBULATORY_CARE_PROVIDER_SITE_OTHER): Payer: Commercial Managed Care - PPO | Admitting: Family Medicine

## 2022-03-25 VITALS — BP 111/76 | HR 93 | Temp 97.7°F | Ht 64.0 in | Wt 230.0 lb

## 2022-03-25 DIAGNOSIS — Z6839 Body mass index (BMI) 39.0-39.9, adult: Secondary | ICD-10-CM | POA: Diagnosis not present

## 2022-03-25 DIAGNOSIS — E669 Obesity, unspecified: Secondary | ICD-10-CM

## 2022-03-25 DIAGNOSIS — E1169 Type 2 diabetes mellitus with other specified complication: Secondary | ICD-10-CM

## 2022-03-25 DIAGNOSIS — E559 Vitamin D deficiency, unspecified: Secondary | ICD-10-CM

## 2022-03-25 DIAGNOSIS — Z9189 Other specified personal risk factors, not elsewhere classified: Secondary | ICD-10-CM

## 2022-03-25 DIAGNOSIS — Z7984 Long term (current) use of oral hypoglycemic drugs: Secondary | ICD-10-CM

## 2022-03-25 MED ORDER — VITAMIN D (ERGOCALCIFEROL) 1.25 MG (50000 UNIT) PO CAPS: 50000.0000 [IU] | ORAL_CAPSULE | ORAL | 0 refills | Status: DC

## 2022-03-27 NOTE — Progress Notes (Signed)
? ? ? ?Chief Complaint:  ? ?OBESITY ?Dawn Downs is here to discuss her progress with her obesity treatment plan along with follow-up of her obesity related diagnoses. Dawn Downs is on the Category 3 Plan and states she is following her eating plan approximately 60% of the time. Dawn Downs states she is walking for 45 minutes 1 time per week. ? ?Today's visit was #: 11 ?Starting weight: 258 lbs ?Starting date: 09/20/2021 ?Today's weight: 230 lbs ?Today's date: 03/26/2022 ?Total lbs lost to date: 32 ?Total lbs lost since last in-office visit: 1 ? ?Interim History: Dawn Downs had a birthday recently. No issues with the plan. No challenges or concerns. ? ?Subjective:  ? ?1. Type 2 diabetes mellitus with other specified complication, without long-term current use of insulin (HCC) ?Dawn Downs hasn't started [facility] yet. Last A1c was 7.0 on 03/04/2022. Starting this Friday. No lows or highs that she can feel. Doesn't check her blood sugars. ? ?2. Vitamin D deficiency ?Dawn Downs is tolerating medication(s) well without side effects.  Medication compliance is good and patient appears to be taking it as prescribed.  The patient denies additional concerns regarding this condition. Last Vit D level was <40 on recent labs with PCP on 03/04/2022. She si still not taking each week as she forgets. ? ?3. At risk for side effect of medication ?Dawn Downs is at risk for drug side effects due to starting a new medication. ? ?Assessment/Plan:  ? ?Medications Discontinued During This Encounter  ?Medication Reason  ? Vitamin D, Ergocalciferol, (DRISDOL) 1.25 MG (50000 UNIT) CAPS capsule Reorder  ?  ? ?Meds ordered this encounter  ?Medications  ? Vitamin D, Ergocalciferol, (DRISDOL) 1.25 MG (50000 UNIT) CAPS capsule  ?  Sig: Take 1 capsule (50,000 Units total) by mouth every 7 (seven) days.  ?  Dispense:  4 capsule  ?  Refill:  0  ?  30 d supply;  ** OV for RF **   Do not send RF request  ?  ? ?1. Type 2 diabetes mellitus with other specified complication,  without long-term current use of insulin (HCC) ?Leroy will continue metformin, start Mounjaro in the near future. She is to decrease simple carbs and increase protein. We discussed R/B of the medication and all questions were answered ? ?2. Vitamin D deficiency ?- I again reiterated the importance of vitamin D (as well as calcium) to their health and wellbeing.  ?- I reviewed possible symptoms of low Vitamin D:  low energy, depressed mood, muscle aches, joint aches, osteoporosis etc. ?- low Vitamin D levels may be linked to an increased risk of cardiovascular events and even increased risk of cancers- such as colon and breast.  ?- ideal vitamin D levels reviewed with patient  ?- I recommend pt take a weekly prescription vit D - see script below   ?- Informed patient this may be a lifelong thing, and she was encouraged to continue to take the medicine until told otherwise.    ?- weight loss will likely improve availability of vitamin D, thus encouraged Dawn Downs to continue with meal plan and their weight loss efforts to further improve this condition.  Thus, we will need to monitor levels regularly (every 3-4 mo on average) to keep levels within normal limits and prevent over supplementation. ?- pt's questions and concerns regarding this condition addressed. ? ?- Vitamin D, Ergocalciferol, (DRISDOL) 1.25 MG (50000 UNIT) CAPS capsule; Take 1 capsule (50,000 Units total) by mouth every 7 (seven) days.  Dispense: 4 capsule; Refill: 0 ? ?3.  At risk for side effect of medication ?Dawn Downs was given approximately 9 minutes of drug side effect counseling today. We discussed side effect possibility and risk versus benefits. Scott agreed to the medication and will contact this office if these side effects are intolerable. ? ?Repetitive spaced learning was employed today to elicit superior memory formation and behavioral change. ? ?4. Obesity with current BMI of 39.5 ?Dawn Downs is currently in the action stage of change.  As such, her goal is to continue with weight loss efforts. She has agreed to the Category 3 Plan with breakfast options.  ? ?Start Greggory Keen that she already has in hand, but never started prior.  ? ?Exercise goals: As is, increase as tolerated.  ? ?Behavioral modification strategies: increasing lean protein intake, decreasing simple carbohydrates, increasing water intake, and planning for success. ? ?Dawn Downs has agreed to follow-up with our clinic in 2 weeks. She was informed of the importance of frequent follow-up visits to maximize her success with intensive lifestyle modifications for her multiple health conditions.  ? ?Objective:  ? ?Blood pressure 111/76, pulse 93, temperature 97.7 ?F (36.5 ?C), height 5\' 4"  (1.626 m), weight 230 lb (104.3 kg), SpO2 98 %. ?Body mass index is 39.48 kg/m?. ? ?General: Cooperative, alert, well developed, in no acute distress. ?HEENT: Conjunctivae and lids unremarkable. ?Cardiovascular: Regular rhythm.  ?Lungs: Normal work of breathing. ?Neurologic: No focal deficits.  ? ?Lab Results  ?Component Value Date  ? CREATININE 0.67 03/04/2022  ? BUN 16 03/04/2022  ? NA 141 03/04/2022  ? K 4.6 03/04/2022  ? CL 104 03/04/2022  ? CO2 25 03/04/2022  ? ?Lab Results  ?Component Value Date  ? ALT 31 12/03/2021  ? AST 17 12/03/2021  ? ALKPHOS 61 12/03/2021  ? BILITOT 0.5 12/03/2021  ? ?Lab Results  ?Component Value Date  ? HGBA1C 7.0 (H) 03/04/2022  ? HGBA1C 6.8 (H) 12/03/2021  ? HGBA1C 10.3 (H) 08/31/2021  ? HGBA1C 6.4 (H) 12/21/2020  ? HGBA1C 5.8 07/17/2020  ? ?Lab Results  ?Component Value Date  ? INSULIN 27.9 (H) 09/20/2021  ? ?Lab Results  ?Component Value Date  ? TSH 1.77 09/19/2021  ? ?Lab Results  ?Component Value Date  ? CHOL 147 12/03/2021  ? HDL 32.20 (L) 12/03/2021  ? LDLCALC 90 12/03/2021  ? TRIG 123.0 12/03/2021  ? CHOLHDL 5 12/03/2021  ? ?Lab Results  ?Component Value Date  ? VD25OH 36.10 03/04/2022  ? VD25OH 55.3 12/10/2021  ? VD25OH 18.8 (L) 09/20/2021  ? ?Lab Results   ?Component Value Date  ? WBC 8.5 09/19/2021  ? HGB 12.8 09/19/2021  ? HCT 38.2 09/19/2021  ? MCV 77.5 (L) 09/19/2021  ? PLT 330.0 09/19/2021  ? ?No results found for: IRON, TIBC, FERRITIN ? ?Attestation Statements:  ? ?Reviewed by clinician on day of visit: allergies, medications, problem list, medical history, surgical history, family history, social history, and previous encounter notes. ? ? ?I, 09/21/2021, am acting as transcriptionist for Burt Knack, DO. ? ?I have reviewed the above documentation for accuracy and completeness, and I agree with the above. [employer], D.O. ? ?The 21st Century Cures Act was signed into law in 2016 which includes the topic of electronic health records.  This provides immediate access to information in MyChart.  This includes consultation notes, operative notes, office notes, lab results and pathology reports.  If you have any questions about what you read please let 2017 know at your next visit so we can discuss your  concerns and take corrective action if need be.  We are right here with you. ? ? ?

## 2022-04-01 ENCOUNTER — Other Ambulatory Visit (INDEPENDENT_AMBULATORY_CARE_PROVIDER_SITE_OTHER): Payer: Self-pay | Admitting: Family Medicine

## 2022-04-01 DIAGNOSIS — E559 Vitamin D deficiency, unspecified: Secondary | ICD-10-CM

## 2022-04-02 ENCOUNTER — Telehealth (INDEPENDENT_AMBULATORY_CARE_PROVIDER_SITE_OTHER): Payer: Commercial Managed Care - PPO | Admitting: Psychology

## 2022-04-02 DIAGNOSIS — F5089 Other specified eating disorder: Secondary | ICD-10-CM | POA: Diagnosis not present

## 2022-04-08 ENCOUNTER — Encounter (INDEPENDENT_AMBULATORY_CARE_PROVIDER_SITE_OTHER): Payer: Self-pay | Admitting: Family Medicine

## 2022-04-08 ENCOUNTER — Ambulatory Visit (INDEPENDENT_AMBULATORY_CARE_PROVIDER_SITE_OTHER): Payer: Commercial Managed Care - PPO | Admitting: Family Medicine

## 2022-04-08 VITALS — BP 118/78 | HR 86 | Temp 98.2°F | Ht 64.0 in | Wt 237.0 lb

## 2022-04-08 DIAGNOSIS — E669 Obesity, unspecified: Secondary | ICD-10-CM

## 2022-04-08 DIAGNOSIS — F39 Unspecified mood [affective] disorder: Secondary | ICD-10-CM | POA: Diagnosis not present

## 2022-04-08 DIAGNOSIS — E66813 Obesity, class 3: Secondary | ICD-10-CM

## 2022-04-08 DIAGNOSIS — E559 Vitamin D deficiency, unspecified: Secondary | ICD-10-CM | POA: Diagnosis not present

## 2022-04-08 DIAGNOSIS — E1169 Type 2 diabetes mellitus with other specified complication: Secondary | ICD-10-CM

## 2022-04-08 DIAGNOSIS — Z7985 Long-term (current) use of injectable non-insulin antidiabetic drugs: Secondary | ICD-10-CM

## 2022-04-08 DIAGNOSIS — Z6841 Body Mass Index (BMI) 40.0 and over, adult: Secondary | ICD-10-CM

## 2022-04-08 DIAGNOSIS — Z9189 Other specified personal risk factors, not elsewhere classified: Secondary | ICD-10-CM

## 2022-04-08 MED ORDER — VITAMIN D (ERGOCALCIFEROL) 1.25 MG (50000 UNIT) PO CAPS: 50000.0000 [IU] | ORAL_CAPSULE | ORAL | 0 refills | Status: DC

## 2022-04-09 NOTE — Progress Notes (Signed)
? ? ? ?Chief Complaint:  ? ?OBESITY ?Dawn Downs is here to discuss her progress with her obesity treatment plan along with follow-up of her obesity related diagnoses. Dawn Downs is on the Category 3 Plan with breakfast options and states she is following her eating plan approximately 50% of the time. Dawn Downs states she is walking 1-3 miles 2-4 times per week.   ? ?Today's visit was #: 12 ?Starting weight: 258 lbs ?Starting date: 09/20/2021 ?Today's weight: 237 lbs ?Today's date: 04/08/2022 ?Total lbs lost to date: 21 ?Total lbs lost since last in-office visit: 0 ? 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 Folks bringing in food and candies as everyone is stress eating more at work. She gained 7 lbs since her last office visit, and she is on the plan 50 % of the time.  ? ?Subjective:  ? ?1. Type 2 diabetes mellitus with other specified complication, without long-term current use of insulin (HCC) ?Dawn Downs started Dawn Downs last night, and she tolerated it well with no side effects. Last A1c was 7.0 approximately 1 month ago. She hasn't checked her blood sugars yet.  ? ?2. Vitamin D deficiency ?Dawn Downs's last check was 1 month ago. She wasn't taking Dawn Downs weekly prior to her labs at 36.1. ? ?3. Mood disorder (HCC) with emotional eating ?Dawn Downs sees a counselor every 3 weeks on her own for stress management. Her emotional eating is under good control. She sees Dr. Dewaine Conger still every 2 weeks or so.  ? ?4. At risk for anxiety ?Dawn Downs is at risk of developing anxiety due to stress, personal and or family history or current situation. ? ?Assessment/Plan:  ?No orders of the defined types were placed in this encounter. ? ? ?Medications Discontinued During This Encounter  ?Medication Reason  ? Vitamin D, Ergocalciferol, (DRISDOL) 1.25 MG (50000 UNIT) CAPS capsule Reorder  ?  ? ?Meds ordered this encounter  ?Medications  ? Vitamin D, Ergocalciferol, (DRISDOL) 1.25 MG (50000 UNIT) CAPS  capsule  ?  Sig: Take 1 capsule (50,000 Units total) by mouth every 7 (seven) days.  ?  Dispense:  4 capsule  ?  Refill:  0  ?  30 d supply;  ** OV for RF **   Do not send RF request  ?  ? ?1. Type 2 diabetes mellitus with other specified complication, without long-term current use of insulin (HCC) ?Dawn Downs will ask her PCP for generic glucometer and supplies. She will continue Mounjaro and metformin. ? ?2. Vitamin D deficiency ?We will refill prescription Vitamin D for 1 month. Dawn Downs will follow-up for routine testing of Vitamin D, at least 2-3 times per year to avoid over-replacement. ? ?- Vitamin D, Ergocalciferol, (DRISDOL) 1.25 MG (50000 UNIT) CAPS capsule; Take 1 capsule (50,000 Units total) by mouth every 7 (seven) days.  Dispense: 4 capsule; Refill: 0 ? ?3. Mood disorder (HCC) with emotional eating ?Dawn Downs denies the need for medications at this time. She will continue with stress management strategies and continue with counseling.  ? ?4. At risk for anxiety ?Dawn Downs was given approximately 9 minutes of anxiety risk counseling today. She has risk factors for anxiety including stress. We discussed the importance of a healthy work life balance, a healthy relationship with food and a good support system. ? ?Repetitive spaced learning was employed today to elicit superior memory formation and behavioral change. ? ?5. Obesity with current BMI of 40.8 ?Dawn Downs is currently in the action stage of change. As such, her  goal is to continue with weight loss efforts. She has agreed to the Category 3 Plan.  ? ?Focus on self-care. ? ?Exercise goals: As is, increase as tolerated to help with stress. ? ?Behavioral modification strategies: no skipping meals, dealing with family or coworker sabotage, and avoiding temptations. ? ?Dawn Downs has agreed to follow-up with our clinic in 4 weeks. She was informed of the importance of frequent follow-up visits to maximize her success with intensive lifestyle modifications for  her multiple health conditions.  ? ?Objective:  ? ?Blood pressure 118/78, pulse 86, temperature 98.2 ?F (36.8 ?C), height 5\' 4"  (1.626 m), weight 237 lb (107.5 kg), last menstrual period 03/05/2022, SpO2 99 %. ?Body mass index is 40.68 kg/m?. ? ?General: Cooperative, alert, well developed, in no acute distress. ?HEENT: Conjunctivae and lids unremarkable. ?Cardiovascular: Regular rhythm.  ?Lungs: Normal work of breathing. ?Neurologic: No focal deficits.  ? ?Lab Results  ?Component Value Date  ? CREATININE 0.67 03/04/2022  ? BUN 16 03/04/2022  ? NA 141 03/04/2022  ? K 4.6 03/04/2022  ? CL 104 03/04/2022  ? CO2 25 03/04/2022  ? ?Lab Results  ?Component Value Date  ? ALT 31 12/03/2021  ? AST 17 12/03/2021  ? ALKPHOS 61 12/03/2021  ? BILITOT 0.5 12/03/2021  ? ?Lab Results  ?Component Value Date  ? HGBA1C 7.0 (H) 03/04/2022  ? HGBA1C 6.8 (H) 12/03/2021  ? HGBA1C 10.3 (H) 08/31/2021  ? HGBA1C 6.4 (H) 12/21/2020  ? HGBA1C 5.8 07/17/2020  ? ?Lab Results  ?Component Value Date  ? INSULIN 27.9 (H) 09/20/2021  ? ?Lab Results  ?Component Value Date  ? TSH 1.77 09/19/2021  ? ?Lab Results  ?Component Value Date  ? CHOL 147 12/03/2021  ? HDL 32.20 (L) 12/03/2021  ? LDLCALC 90 12/03/2021  ? TRIG 123.0 12/03/2021  ? CHOLHDL 5 12/03/2021  ? ?Lab Results  ?Component Value Date  ? VD25OH 36.10 03/04/2022  ? VD25OH 55.3 12/10/2021  ? VD25OH 18.8 (L) 09/20/2021  ? ?Lab Results  ?Component Value Date  ? WBC 8.5 09/19/2021  ? HGB 12.8 09/19/2021  ? HCT 38.2 09/19/2021  ? MCV 77.5 (L) 09/19/2021  ? PLT 330.0 09/19/2021  ? ?No results found for: IRON, TIBC, FERRITIN ? ?Attestation Statements:  ? ?Reviewed by clinician on day of visit: allergies, medications, problem list, medical history, surgical history, family history, social history, and previous encounter notes. ? ? ?I, 09/21/2021, am acting as transcriptionist for Burt Knack, DO. ? ?I have reviewed the above documentation for accuracy and completeness, and I agree with the  above. [employer], D.O. ? ?The 21st Century Cures Act was signed into law in 2016 which includes the topic of electronic health records.  This provides immediate access to information in MyChart.  This includes consultation notes, operative notes, office notes, lab results and pathology reports.  If you have any questions about what you read please let 2017 know at your next visit so we can discuss your concerns and take corrective action if need be.  We are right here with you. ? ? ?

## 2022-04-16 NOTE — Progress Notes (Signed)
?  Office: [phone]  /  Fax: [phone] ? ? ? ?Date: 04/30/2022   ?Appointment Start Time: 8:01am ?Duration: 19 minutes ?Provider: Glennie Isle, Psy.D. ?Type of Session: Individual Therapy  ?Location of Patient: Work (private location) ?Location of Provider: Provider's Home (private office) ?Type of Contact: Telepsychological Visit via MyChart Video Visit ? ?Session Content: Dawn Downs is a 46 y.o. female presenting for a follow-up appointment to address the previously established treatment goal of increasing coping skills.Today's appointment was a telepsychological visit due to COVID-19. Dawn Downs provided verbal consent for today's telepsychological appointment and she is aware she is responsible for securing confidentiality on her end of the session. Prior to proceeding with today's appointment, Dawn Downs's physical location at the time of this appointment was obtained as well a phone number she could be reached at in the event of technical difficulties. Dawn Downs and this provider participated in today's telepsychological service. Of note, today's appointment was switched to a regular telephone call at 8:11am with Dawn Downs's verbal consent due to technical issues.  ? ?This provider conducted a brief check-in. Dawn Downs shared, "Been working a lot." She added, "Emotional eating has definitely been better." Notably, Dawn Downs discussed she is trying to focus more on mindfulness and prayer for overall well-being/coping.  As such, session focused further on mindfulness to assist with coping. Dawn Downs was led through a mindfulness exercise [employer] ) and her experience was processed. Dawn Downs provided verbal consent during today's appointment for this provider to send a handout for today's exercise via e-mail. Neariah was receptive to today's appointment as evidenced by openness to sharing, responsiveness to feedback, and willingness to continue engaging in learned skills. ? ?Mental Status Examination:   ?Appearance: neat ?Behavior: appropriate to circumstances ?Mood: neutral ?Affect: mood congruent ?Speech: WNL ?Eye Contact: appropriate ?Psychomotor Activity: WNL ?Gait: unable to assess ?Thought Process: linear, logical, and goal directed and no evidence or endorsement of suicidal, homicidal, and self-harm ideation, plan and intent  ?Thought Content/Perception: no hallucinations, delusions, bizarre thinking or behavior endorsed or observed ?Orientation: AAOx4 ?Memory/Concentration: memory, attention, language, and fund of knowledge intact  ?Insight: good ?Judgment: good ? ?Interventions:  ?Conducted a brief chart review ?Provided empathic reflections and validation ?Employed supportive psychotherapy interventions to facilitate reduced distress and to improve coping skills with identified stressors ?Engaged patient in mindfulness exercise(s) ? ?DSM-5 Diagnosis(es): F50.89 Other Specified Feeding or Eating Disorder, Emotional Eating Behaviors ? ?Treatment Goal & Progress: During the initial appointment with this provider, the following treatment goal was established: increase coping skills. Dawn Downs has demonstrated progress in her goal as evidenced by increased awareness of hunger patterns, increased awareness of triggers for emotional eating behaviors, and reduction in emotional eating behaviors . Dawn Downs also continues to demonstrate willingness to engage in learned skill(s). ? ?Plan: Based on appointment availability and Dawn Downs's upcoming vacation, the next appointment is scheduled for 06/10/2022 at 8:00am, which will be via MyChart Video Visit. The next session will focus on reviewing learned skills and termination. Dawn Downs will continue meeting with her primary therapist.  ? ?

## 2022-04-23 ENCOUNTER — Ambulatory Visit (INDEPENDENT_AMBULATORY_CARE_PROVIDER_SITE_OTHER): Payer: Commercial Managed Care - PPO | Admitting: Family Medicine

## 2022-04-30 ENCOUNTER — Telehealth (INDEPENDENT_AMBULATORY_CARE_PROVIDER_SITE_OTHER): Payer: Commercial Managed Care - PPO | Admitting: Psychology

## 2022-04-30 DIAGNOSIS — F5089 Other specified eating disorder: Secondary | ICD-10-CM | POA: Diagnosis not present

## 2022-05-01 ENCOUNTER — Telehealth (INDEPENDENT_AMBULATORY_CARE_PROVIDER_SITE_OTHER): Payer: Self-pay | Admitting: Family Medicine

## 2022-05-01 ENCOUNTER — Encounter (INDEPENDENT_AMBULATORY_CARE_PROVIDER_SITE_OTHER): Payer: Self-pay | Admitting: Family Medicine

## 2022-05-01 NOTE — Telephone Encounter (Signed)
[facility] refill. [facility]. Patient says she does not have Mychart. ?

## 2022-05-02 ENCOUNTER — Other Ambulatory Visit (INDEPENDENT_AMBULATORY_CARE_PROVIDER_SITE_OTHER): Payer: Self-pay | Admitting: Bariatrics

## 2022-05-02 ENCOUNTER — Encounter (INDEPENDENT_AMBULATORY_CARE_PROVIDER_SITE_OTHER): Payer: Self-pay

## 2022-05-02 DIAGNOSIS — E1169 Type 2 diabetes mellitus with other specified complication: Secondary | ICD-10-CM

## 2022-05-02 MED ORDER — TIRZEPATIDE 2.5 MG/0.5ML ~~LOC~~ SOAJ: 2.5000 mg | SUBCUTANEOUS | 0 refills | Status: DC

## 2022-05-02 NOTE — Telephone Encounter (Signed)
Needs refill on Mounjaro-see other message ? ?LAST APPOINTMENT DATE: 04/08/22 ?NEXT APPOINTMENT DATE: 05/16/2022 ? ? ?[facility] [id number] ?[place] [address] ?Phone: [phone] Fax: [phone] ? ?[id number] [place], [place] - [address] ?[address] ?[address] ?[place] [address] ?Phone: [phone] Fax: [phone] ? ?Patient is requesting a refill of the following medications: ?No prescriptions requested or ordered in this encounter ? ? ?Date last filled: 03/11/2022 ?Previously prescribed by Dr Raliegh Scarlet ? ?Lab Results ?     Component                Value               Date                 ?     HGBA1C                   7.0 (H)             03/04/2022           ?     HGBA1C                   6.8 (H)             12/03/2021           ?     HGBA1C                   10.3 (H)            08/31/2021           ?Lab Results ?     Component                Value               Date                 ?     MICROALBUR               1.1                 09/19/2021           ?     Wilder                  90                  12/03/2021           ?     CREATININE               0.67                03/04/2022           ?Lab Results ?     Component                Value               Date                 ?     VD25OH                   36.10               03/04/2022           ?  VD25OH                   55.3                12/10/2021           ?     VD25OH                   18.8 (L)            09/20/2021           ? ?BP Readings from Last 3 Encounters: ?04/08/22 : 118/78 ?03/25/22 : 111/76 ?03/11/22 : 140/83 ?

## 2022-05-02 NOTE — Telephone Encounter (Signed)
2nd message

## 2022-05-16 ENCOUNTER — Encounter (INDEPENDENT_AMBULATORY_CARE_PROVIDER_SITE_OTHER): Payer: Self-pay | Admitting: Family Medicine

## 2022-05-16 ENCOUNTER — Ambulatory Visit (INDEPENDENT_AMBULATORY_CARE_PROVIDER_SITE_OTHER): Payer: Commercial Managed Care - PPO | Admitting: Family Medicine

## 2022-05-16 VITALS — BP 123/83 | HR 95 | Temp 97.8°F | Ht 64.0 in | Wt 235.0 lb

## 2022-05-16 DIAGNOSIS — Z7985 Long-term (current) use of injectable non-insulin antidiabetic drugs: Secondary | ICD-10-CM | POA: Diagnosis not present

## 2022-05-16 DIAGNOSIS — E669 Obesity, unspecified: Secondary | ICD-10-CM | POA: Diagnosis not present

## 2022-05-16 DIAGNOSIS — E1169 Type 2 diabetes mellitus with other specified complication: Secondary | ICD-10-CM | POA: Diagnosis not present

## 2022-05-16 DIAGNOSIS — Z6841 Body Mass Index (BMI) 40.0 and over, adult: Secondary | ICD-10-CM

## 2022-05-16 DIAGNOSIS — Z9189 Other specified personal risk factors, not elsewhere classified: Secondary | ICD-10-CM

## 2022-05-16 MED ORDER — TIRZEPATIDE 5 MG/0.5ML ~~LOC~~ SOAJ: 5.0000 mg | SUBCUTANEOUS | 0 refills | Status: DC

## 2022-05-21 LAB — HM PAP SMEAR

## 2022-05-27 NOTE — Progress Notes (Unsigned)
Chief Complaint:   OBESITY Dawn Downs is here to discuss her progress with her obesity treatment plan along with follow-up of her obesity related diagnoses. Dawn Downs is on the Category 3 Plan and states she is following her eating plan approximately 75% of the time. Dawn Downs states she is not currently exercising.   Today's visit was #: 13 Starting weight: 258 lbs Starting date: 09/20/2021 Today's weight: 235 lbs Today's date: 05/16/2022 Total lbs lost to date: 23 lbs Total lbs lost since last in-office visit: 2 lbs  Interim History: Dawn Downs is still not eating much, but better. It is still difficult to get lunch in because she is busy at work. She desires to journal so she can really see what she is doing well with of falling short on.  Subjective:   1. Type 2 diabetes mellitus with other specified complication, without long-term current use of insulin (HCC) Willowdean is not checking her blood sugars and not knowing if having low's or high's, per patient. Dawn Downs is tolerating Mounjaro well with no side effects. She notices only a slight decrease in hunger/gets fuller quicker.  2. At risk for side effect of medication Dawn Downs is at risk for drug side effects due to increasing her Mounjaro today.  Assessment/Plan:   Orders Placed This Encounter  Procedures   HM PAP SMEAR    Medications Discontinued During This Encounter  Medication Reason   tirzepatide [facility]) 2.5 MG/0.5ML Pen      Meds ordered this encounter  Medications   tirzepatide (MOUNJARO) 5 MG/0.5ML Pen    Sig: Inject 5 mg into the skin once a week.    Dispense:  2 mL    Refill:  0     1. Type 2 diabetes mellitus with other specified complication, without long-term current use of insulin (HCC) Good blood sugar control is important to decrease the likelihood of diabetic complications such as nephropathy, neuropathy, limb loss, blindness, coronary artery disease, and death. Intensive lifestyle modification  including diet, exercise and weight loss are the first line of treatment for diabetes.  We will increase Mounjaro to 5 mg into skin weekly.  - tirzepatide [facility]) 5 MG/0.5ML Pen; Inject 5 mg into the skin once a week.  Dispense: 2 mL; Refill: 0  2. At risk for side effect of medication Lenni was given approximately 9 minutes of drug side effect counseling today.  We discussed side effect possibility and risk versus benefits. Chee agreed to the medication and will contact this office if these side effects are intolerable.  Repetitive spaced learning was employed today to elicit superior memory formation and behavioral change.  3. Obesity, Current BMI 40.4 Dawn Downs is currently in the action stage of change. As such, her goal is to continue with weight loss efforts. She has agreed to keeping a food journal and adhering to recommended goals of 1,700 calories and 130 grams of  protein.   Dawn Downs will bring her journaling log to her next office visit. She will use Category 3 meal plan as a guide, but will journal daily.  Exercise goals: As is.  Behavioral modification strategies: increasing lean protein intake, decreasing simple carbohydrates, and planning for success.  Dawn Downs has agreed to follow-up with our clinic in 2-3 weeks. She was informed of the importance of frequent follow-up visits to maximize her success with intensive lifestyle modifications for her multiple health conditions.   Objective:   Blood pressure 123/83, pulse 95, temperature 97.8 F (36.6 C), height 5\' 4"  (1.626 m),  weight 235 lb (106.6 kg), SpO2 97 %. Body mass index is 40.34 kg/m.  General: Cooperative, alert, well developed, in no acute distress. HEENT: Conjunctivae and lids unremarkable. Cardiovascular: Regular rhythm.  Lungs: Normal work of breathing. Neurologic: No focal deficits.   Lab Results  Component Value Date   CREATININE 0.67 03/04/2022   BUN 16 03/04/2022   NA 141 03/04/2022   K 4.6  03/04/2022   CL 104 03/04/2022   CO2 25 03/04/2022   Lab Results  Component Value Date   ALT 31 12/03/2021   AST 17 12/03/2021   ALKPHOS 61 12/03/2021   BILITOT 0.5 12/03/2021   Lab Results  Component Value Date   HGBA1C 7.0 (H) 03/04/2022   HGBA1C 6.8 (H) 12/03/2021   HGBA1C 10.3 (H) 08/31/2021   HGBA1C 6.4 (H) 12/21/2020   HGBA1C 5.8 07/17/2020   Lab Results  Component Value Date   INSULIN 27.9 (H) 09/20/2021   Lab Results  Component Value Date   TSH 1.77 09/19/2021   Lab Results  Component Value Date   CHOL 147 12/03/2021   HDL 32.20 (L) 12/03/2021   LDLCALC 90 12/03/2021   TRIG 123.0 12/03/2021   CHOLHDL 5 12/03/2021   Lab Results  Component Value Date   VD25OH 36.10 03/04/2022   VD25OH 55.3 12/10/2021   VD25OH 18.8 (L) 09/20/2021   Lab Results  Component Value Date   WBC 8.5 09/19/2021   HGB 12.8 09/19/2021   HCT 38.2 09/19/2021   MCV 77.5 (L) 09/19/2021   PLT 330.0 09/19/2021   No results found for: IRON, TIBC, FERRITIN  Attestation Statements:   Reviewed by clinician on day of visit: allergies, medications, problem list, medical history, surgical history, family history, social history, and previous encounter notes.  IPaulla Fore, CMA, am acting as transcriptionist for Dr. Sharee Holster, DO.  I have reviewed the above documentation for accuracy and completeness, and I agree with the above. [name], D.O.  The 21st Century Cures Act was signed into law in 2016 which includes the topic of electronic health records.  This provides immediate access to information in MyChart.  This includes consultation notes, operative notes, office notes, lab results and pathology reports.  If you have any questions about what you read please let us know at your next visit so we can discuss your concerns and take corrective action if need be.  We are right here with you.

## 2022-05-28 NOTE — Progress Notes (Signed)
  Office: [phone]  /  Fax: [phone]    Date: 06/10/2022   Appointment Start Time: 8:01am Duration: 23 minutes Provider: Lawerance Cruel, Psy.D. Type of Session: Individual Therapy  Location of Patient: Work (private location) Location of Provider: Provider's Home (private office) Type of Contact: Telepsychological Visit via MyChart Video Visit  Session Content: Dawn Downs is a 46 y.o. female presenting for a follow-up appointment to address the previously established treatment goal of increasing coping skills.Today's appointment was a telepsychological visit due to COVID-19. Dawn Downs provided verbal consent for today's telepsychological appointment and she is aware she is responsible for securing confidentiality on her end of the session. Prior to proceeding with today's appointment, Dawn Downs's physical location at the time of this appointment was obtained as well a phone number she could be reached at in the event of technical difficulties. Dawn Downs and this provider participated in today's telepsychological service. Of note, today's appointment was switched to a regular telephone call at 8:08am with Dawn Downs's verbal consent due to technical issues on her end.   This provider conducted a brief check-in. Dawn Downs shared about a recent vacation, a reduction in work-related responsibilities, and an increase in self-care (e.g., walking, family time). Regarding eating habits, she discussed a phase during which she was "eating not enough"due to Dawn Downs. Further explored and processed. She reported using alarms to help her eat regularly, adding the medication has helped her make better choices. Positive reinforcement was provided. She continued to report a reduction in emotional eating behaviors. Notably, Dawn Downs stated she noticed she was spending more money as she lost weight. Further explored and processed. Reviewed learned skills to assist with coping with the aforementioned. Dawn Downs was also  agreeable to discussing further with her primary therapist. Overall, Dawn Downs was receptive to today's appointment as evidenced by openness to sharing, responsiveness to feedback, and willingness to continue engaging in learned skills.  Mental Status Examination:  Appearance: neat Behavior: appropriate to circumstances Mood: neutral Affect: mood congruent Speech: WNL Eye Contact: appropriate Psychomotor Activity: WNL Gait: unable to assess Thought Process: linear, logical, and goal directed and no evidence or endorsement of suicidal, homicidal, and self-harm ideation, plan and intent  Thought Content/Perception: no hallucinations, delusions, bizarre thinking or behavior endorsed or observed Orientation: AAOx4 Memory/Concentration: memory, attention, language, and fund of knowledge intact  Insight: good Judgment: good  Interventions:  Conducted a brief chart review Provided empathic reflections and validation Provided positive reinforcement Employed supportive psychotherapy interventions to facilitate reduced distress and to improve coping skills with identified stressors Employed acceptance and commitment interventions to emphasize mindfulness  DSM-5 Diagnosis(es): F50.89 Other Specified Feeding or Eating Disorder, Emotional Eating Behaviors  Treatment Goal & Progress: During the initial appointment with this provider, the following treatment goal was established: increase coping skills. Dawn Downs demonstrated progress in her goal as evidenced by increased awareness of hunger patterns, increased awareness of triggers for emotional eating behaviors, and reduction in emotional eating behaviors . Dawn Downs also continues to demonstrate willingness to engage in learned skill(s).  Plan: As previously planned, today was Dawn Downs's last appointment with this provider. She acknowledged understanding that she may request a follow-up appointment with this provider in the future as long as she is  still established with the clinic. No further follow-up planned by this provider.

## 2022-06-06 ENCOUNTER — Ambulatory Visit (INDEPENDENT_AMBULATORY_CARE_PROVIDER_SITE_OTHER): Payer: Commercial Managed Care - PPO | Admitting: Family Medicine

## 2022-06-06 ENCOUNTER — Telehealth: Payer: Self-pay

## 2022-06-06 ENCOUNTER — Encounter: Payer: Self-pay | Admitting: Family Medicine

## 2022-06-06 VITALS — BP 130/80 | HR 102 | Temp 98.3°F | Resp 16 | Ht 64.0 in | Wt 229.4 lb

## 2022-06-06 DIAGNOSIS — Z1231 Encounter for screening mammogram for malignant neoplasm of breast: Secondary | ICD-10-CM

## 2022-06-06 DIAGNOSIS — Z0000 Encounter for general adult medical examination without abnormal findings: Secondary | ICD-10-CM | POA: Diagnosis not present

## 2022-06-06 DIAGNOSIS — E559 Vitamin D deficiency, unspecified: Secondary | ICD-10-CM | POA: Diagnosis not present

## 2022-06-06 DIAGNOSIS — E119 Type 2 diabetes mellitus without complications: Secondary | ICD-10-CM | POA: Diagnosis not present

## 2022-06-06 DIAGNOSIS — Z1211 Encounter for screening for malignant neoplasm of colon: Secondary | ICD-10-CM

## 2022-06-06 LAB — CBC WITH DIFFERENTIAL/PLATELET
Basophils Absolute: 0 10*3/uL (ref 0.0–0.1)
Basophils Relative: 0.5 % (ref 0.0–3.0)
Eosinophils Absolute: 0.2 10*3/uL (ref 0.0–0.7)
Eosinophils Relative: 1.7 % (ref 0.0–5.0)
HCT: 38.7 % (ref 36.0–46.0)
Hemoglobin: 12.9 g/dL (ref 12.0–15.0)
Lymphocytes Relative: 18.4 % (ref 12.0–46.0)
Lymphs Abs: 1.9 10*3/uL (ref 0.7–4.0)
MCHC: 33.4 g/dL (ref 30.0–36.0)
MCV: 74.7 fl — ABNORMAL LOW (ref 78.0–100.0)
Monocytes Absolute: 0.6 10*3/uL (ref 0.1–1.0)
Monocytes Relative: 5.5 % (ref 3.0–12.0)
Neutro Abs: 7.7 10*3/uL (ref 1.4–7.7)
Neutrophils Relative %: 73.9 % (ref 43.0–77.0)
Platelets: 360 10*3/uL (ref 150.0–400.0)
RBC: 5.18 Mil/uL — ABNORMAL HIGH (ref 3.87–5.11)
RDW: 17.4 % — ABNORMAL HIGH (ref 11.5–15.5)
WBC: 10.4 10*3/uL (ref 4.0–10.5)

## 2022-06-06 LAB — HEPATIC FUNCTION PANEL
ALT: 16 U/L (ref 0–35)
AST: 13 U/L (ref 0–37)
Albumin: 4.7 g/dL (ref 3.5–5.2)
Alkaline Phosphatase: 64 U/L (ref 39–117)
Bilirubin, Direct: 0.1 mg/dL (ref 0.0–0.3)
Total Bilirubin: 0.5 mg/dL (ref 0.2–1.2)
Total Protein: 7.6 g/dL (ref 6.0–8.3)

## 2022-06-06 LAB — BASIC METABOLIC PANEL
BUN: 19 mg/dL (ref 6–23)
CO2: 26 mEq/L (ref 19–32)
Calcium: 10.1 mg/dL (ref 8.4–10.5)
Chloride: 104 mEq/L (ref 96–112)
Creatinine, Ser: 0.74 mg/dL (ref 0.40–1.20)
GFR: 97.16 mL/min (ref >60.00)
Glucose, Bld: 117 mg/dL — ABNORMAL HIGH (ref 70–99)
Potassium: 4.5 mEq/L (ref 3.5–5.1)
Sodium: 138 mEq/L (ref 135–145)

## 2022-06-06 LAB — LIPID PANEL
Cholesterol: 123 mg/dL (ref 0–200)
HDL: 33.7 mg/dL — ABNORMAL LOW (ref >39.00)
LDL Cholesterol: 74 mg/dL (ref 0–99)
NonHDL: 88.98
Total CHOL/HDL Ratio: 4
Triglycerides: 76 mg/dL (ref 0.0–149.0)
VLDL: 15.2 mg/dL (ref 0.0–40.0)

## 2022-06-06 LAB — VITAMIN D 25 HYDROXY (VIT D DEFICIENCY, FRACTURES): VITD: 101.01 ng/mL (ref 30.00–100.00)

## 2022-06-06 LAB — HEMOGLOBIN A1C: Hgb A1c MFr Bld: 6.2 % (ref 4.6–6.5)

## 2022-06-06 LAB — TSH: TSH: 1.44 u[IU]/mL (ref 0.35–5.50)

## 2022-06-06 NOTE — Assessment & Plan Note (Signed)
Pt's PE WNL w/ exception of obesity.  UTD on pap.  Due for mammo and colonoscopy- order entered and referral placed.  Check labs.  Anticipatory guidance provided.

## 2022-06-06 NOTE — Telephone Encounter (Signed)
-----   Message from Sheliah Hatch, MD sent at 06/06/2022 11:53 AM EDT ----- Labs look good w/ exception of elevated Vit D.  Based on this, please stop all Vit D supplements until your next visit and we will recheck  A1C is AWESOME!!!

## 2022-06-06 NOTE — Assessment & Plan Note (Signed)
Chronic problem.  UTD on foot exam, microalbumin.  Pt to schedule eye exam.  Check labs.  Adjust meds prn

## 2022-06-06 NOTE — Patient Instructions (Addendum)
Follow up in 3-4 months to recheck sugars We'll notify you of your lab results and make any changes if needed Continue to work on healthy diet and regular exercise- you're doing great!! Schedule your eye exam and have them send me a copy of the report We'll call you with your GI appt They should call you to schedule your mammogram Call with any questions or concerns Stay Safe!  Stay Healthy! Have a great summer!!!

## 2022-06-06 NOTE — Telephone Encounter (Signed)
Spoke w/ pt and advised her of lab results  

## 2022-06-06 NOTE — Progress Notes (Signed)
   Subjective:    Patient ID: Dawn Downs, female    DOB: 09-Apr-1976, 46 y.o.   MRN: [id number]  HPI CPE- UTD on foot exam, microalbumin, pap smear.  Pt to schedule eye exam.  Due for mammo and colonoscopy  Patient Care Team    Relationship Specialty Notifications Start End  Midge Minium, MD PCP - General Family Medicine  11/14/17   Paula Compton, MD Consulting Physician Obstetrics and Gynecology  12/14/19     Health Maintenance  Topic Date Due   OPHTHALMOLOGY EXAM  01/10/2021   COLONOSCOPY (Pts 45-53yrs Insurance coverage will need to be confirmed)  06/26/2022 (Originally 03/18/2021)   INFLUENZA VACCINE  07/30/2022   HEMOGLOBIN A1C  09/04/2022   FOOT EXAM  09/19/2022   URINE MICROALBUMIN  09/19/2022   PAP SMEAR-Modifier  05/21/2025   TETANUS/TDAP  07/03/2025   COVID-19 Vaccine  Completed   HPV VACCINES  Aged Out   Hepatitis C Screening  Discontinued   HIV Screening  Discontinued      Review of Systems Patient reports no vision/ hearing changes, adenopathy,fever, persistant/recurrent hoarseness , swallowing issues, chest pain, palpitations, edema, persistant/recurrent cough, hemoptysis, dyspnea (rest/exertional/paroxysmal nocturnal), gastrointestinal bleeding (melena, rectal bleeding), abdominal pain, significant heartburn, bowel changes, GU symptoms (dysuria, hematuria, incontinence), Gyn symptoms (abnormal  bleeding, pain),  syncope, focal weakness, memory loss, numbness & tingling, skin/hair/nail changes, abnormal bruising or bleeding, anxiety, or depression.   + 6 lb weight loss    Objective:   Physical Exam General Appearance:    Alert, cooperative, no distress, appears stated age, obese  Head:    Normocephalic, without obvious abnormality, atraumatic  Eyes:    PERRL, conjunctiva/corneas clear, EOM's intact both eyes  Ears:    Normal TM's and external ear canals, both ears  Nose:   Nares normal, septum midline, mucosa normal, no drainage    or sinus  tenderness  Throat:   Lips, mucosa, and tongue normal; teeth and gums normal  Neck:   Supple, symmetrical, trachea midline, no adenopathy;    Thyroid: no enlargement/tenderness/nodules  Back:     Symmetric, no curvature, ROM normal, no CVA tenderness  Lungs:     Clear to auscultation bilaterally, respirations unlabored  Chest Wall:    No tenderness or deformity   Heart:    Regular rate and rhythm, S1 and S2 normal, no murmur, rub   or gallop  Breast Exam:    Deferred to GYN  Abdomen:     Soft, non-tender, bowel sounds active all four quadrants,    no masses, no organomegaly  Genitalia:    Deferred to GYN  Rectal:    Extremities:   Extremities normal, atraumatic, no cyanosis or edema  Pulses:   2+ and symmetric all extremities  Skin:   Skin color, texture, turgor normal, no rashes or lesions  Lymph nodes:   Cervical, supraclavicular, and axillary nodes normal  Neurologic:   CNII-XII intact, normal strength, sensation and reflexes    throughout          Assessment & Plan:

## 2022-06-06 NOTE — Assessment & Plan Note (Signed)
Check labs and replete prn. 

## 2022-06-10 ENCOUNTER — Telehealth (INDEPENDENT_AMBULATORY_CARE_PROVIDER_SITE_OTHER): Payer: Commercial Managed Care - PPO | Admitting: Psychology

## 2022-06-10 DIAGNOSIS — F5089 Other specified eating disorder: Secondary | ICD-10-CM | POA: Diagnosis not present

## 2022-06-13 ENCOUNTER — Ambulatory Visit (INDEPENDENT_AMBULATORY_CARE_PROVIDER_SITE_OTHER): Payer: Commercial Managed Care - PPO | Admitting: Family Medicine

## 2022-06-13 ENCOUNTER — Encounter (INDEPENDENT_AMBULATORY_CARE_PROVIDER_SITE_OTHER): Payer: Self-pay | Admitting: Family Medicine

## 2022-06-13 VITALS — BP 133/87 | HR 87 | Temp 98.0°F | Ht 64.0 in | Wt 226.0 lb

## 2022-06-13 DIAGNOSIS — E669 Obesity, unspecified: Secondary | ICD-10-CM | POA: Diagnosis not present

## 2022-06-13 DIAGNOSIS — E559 Vitamin D deficiency, unspecified: Secondary | ICD-10-CM

## 2022-06-13 DIAGNOSIS — Z9189 Other specified personal risk factors, not elsewhere classified: Secondary | ICD-10-CM

## 2022-06-13 DIAGNOSIS — Z6838 Body mass index (BMI) 38.0-38.9, adult: Secondary | ICD-10-CM

## 2022-06-13 DIAGNOSIS — E1169 Type 2 diabetes mellitus with other specified complication: Secondary | ICD-10-CM | POA: Diagnosis not present

## 2022-06-13 DIAGNOSIS — E66813 Obesity, class 3: Secondary | ICD-10-CM

## 2022-06-13 DIAGNOSIS — Z7985 Long-term (current) use of injectable non-insulin antidiabetic drugs: Secondary | ICD-10-CM

## 2022-06-13 MED ORDER — TIRZEPATIDE 7.5 MG/0.5ML ~~LOC~~ SOAJ: 7.5000 mg | SUBCUTANEOUS | 0 refills | Status: DC

## 2022-06-19 NOTE — Progress Notes (Unsigned)
Chief Complaint:   OBESITY Dawn Downs is here to discuss her progress with her obesity treatment plan along with follow-up of her obesity related diagnoses. Dawn Downs is on keeping a food journal and adhering to recommended goals of 1700 calories and 130 grams protein and states she is following her eating plan approximately 75-80% of the time. Dawn Downs states she is walking 45 minutes 3 times per week.  Today's visit was #: 14 Starting weight: 258 lbs Starting date: 09/20/2021 Today's weight: 226 lbs Today's date: 06/12/2022 Total lbs lost to date: 32 Total lbs lost since last in-office visit: 9  Interim History: Dawn Downs states, "I have been eating." She is not skipping meals. She also increased her water intake to 120 oz per day. Pt snacks on almonds and string cheese. She denies hunger or cravings.  Subjective:   1. Type 2 diabetes mellitus with other specified complication, without long-term current use of insulin (HCC) Dawn Downs's A1c went from 10.3 when she started the program, to 6.2 a week ago.  2. Vitamin D deficiency Pt's Vitamin D level a week ago was just over 100.  3. At risk for deficient intake of food Dawn Downs is at risk for deficient intake of food due to increasing dose of Mounjaro.  Assessment/Plan:   1. Type 2 diabetes mellitus with other specified complication, without long-term current use of insulin (HCC) Good blood sugar control is important to decrease the likelihood of diabetic complications such as nephropathy, neuropathy, limb loss, blindness, coronary artery disease, and death. Intensive lifestyle modification including diet, exercise and weight loss are the first line of treatment for diabetes. Increase Mounjaro to 7.5 mg and discontinue 5.0 mg dose.  Increase & Fill- tirzepatide (MOUNJARO) 7.5 MG/0.5ML Pen; Inject 7.5 mg into the skin once a week.  Dispense: 2 mL; Refill: 0  2. Vitamin D deficiency Low Vitamin D level contributes to fatigue and are  associated with obesity, breast, and colon cancer. She agrees to discontinue prescription Vitamin D 50,000 IU and will follow-up for routine testing of Vitamin D, at least 2-3 times per year to avoid over-replacement. Pt loves the sun and spends a lot of time by the pool. Repeat labs in 3 months.  3. At risk for deficient intake of food Dawn Downs was given extensive education and counseling today of more than 9 minutes on risks associated with deficient food intake. Counseled her on the importance of following our prescribed meal plan and eating adequate amounts of protein. Discussed with Dawn Downs that inadequate food intake over longer periods of time can slow their metabolism down significantly.   4. Obesity, Current BMI 38.9 Dawn Downs is currently in the action stage of change. As such, her goal is to continue with weight loss efforts. She has agreed to keeping a food journal and adhering to recommended goals of 1700 calories and 130 grams protein.   Bring in log of foods, calorie, and protein intake to next OV.  Exercise goals:  As is  Behavioral modification strategies: increasing lean protein intake, no skipping meals, and planning for success.  Dawn Downs has agreed to follow-up with our clinic in 3 weeks. She was informed of the importance of frequent follow-up visits to maximize her success with intensive lifestyle modifications for her multiple health conditions.   Objective:   Blood pressure 133/87, pulse 87, temperature 98 F (36.7 C), height 5\' 4"  (1.626 m), weight 226 lb (102.5 kg), SpO2 97 %. Body mass index is 38.79 kg/m.  General: Cooperative,  alert, well developed, in no acute distress. HEENT: Conjunctivae and lids unremarkable. Cardiovascular: Regular rhythm.  Lungs: Normal work of breathing. Neurologic: No focal deficits.   Lab Results  Component Value Date   CREATININE 0.74 06/06/2022   BUN 19 06/06/2022   NA 138 06/06/2022   K 4.5 06/06/2022   CL 104  06/06/2022   CO2 26 06/06/2022   Lab Results  Component Value Date   ALT 16 06/06/2022   AST 13 06/06/2022   ALKPHOS 64 06/06/2022   BILITOT 0.5 06/06/2022   Lab Results  Component Value Date   HGBA1C 6.2 06/06/2022   HGBA1C 7.0 (H) 03/04/2022   HGBA1C 6.8 (H) 12/03/2021   HGBA1C 10.3 (H) 08/31/2021   HGBA1C 6.4 (H) 12/21/2020   Lab Results  Component Value Date   INSULIN 27.9 (H) 09/20/2021   Lab Results  Component Value Date   TSH 1.44 06/06/2022   Lab Results  Component Value Date   CHOL 123 06/06/2022   HDL 33.70 (L) 06/06/2022   LDLCALC 74 06/06/2022   TRIG 76.0 06/06/2022   CHOLHDL 4 06/06/2022   Lab Results  Component Value Date   VD25OH 101.01 (HH) 06/06/2022   VD25OH 36.10 03/04/2022   VD25OH 55.3 12/10/2021   Lab Results  Component Value Date   WBC 10.4 06/06/2022   HGB 12.9 06/06/2022   HCT 38.7 06/06/2022   MCV 74.7 (L) 06/06/2022   PLT 360.0 06/06/2022    Attestation Statements:   Reviewed by clinician on day of visit: allergies, medications, problem list, medical history, surgical history, family history, social history, and previous encounter notes.  I, Kyung Rudd, BS, CMA, am acting as transcriptionist for [employer], DO.  I have reviewed the above documentation for accuracy and completeness, and I agree with the above. -  ***

## 2022-06-21 ENCOUNTER — Encounter: Payer: Self-pay | Admitting: Family Medicine

## 2022-07-08 ENCOUNTER — Encounter (INDEPENDENT_AMBULATORY_CARE_PROVIDER_SITE_OTHER): Payer: Self-pay | Admitting: Family Medicine

## 2022-07-08 ENCOUNTER — Ambulatory Visit (INDEPENDENT_AMBULATORY_CARE_PROVIDER_SITE_OTHER): Payer: Commercial Managed Care - PPO | Admitting: Family Medicine

## 2022-07-08 VITALS — BP 143/82 | HR 100 | Temp 97.9°F | Ht 64.0 in | Wt 219.0 lb

## 2022-07-08 DIAGNOSIS — Z6837 Body mass index (BMI) 37.0-37.9, adult: Secondary | ICD-10-CM | POA: Diagnosis not present

## 2022-07-08 DIAGNOSIS — E669 Obesity, unspecified: Secondary | ICD-10-CM | POA: Diagnosis not present

## 2022-07-08 DIAGNOSIS — E1159 Type 2 diabetes mellitus with other circulatory complications: Secondary | ICD-10-CM | POA: Diagnosis not present

## 2022-07-08 DIAGNOSIS — Z9189 Other specified personal risk factors, not elsewhere classified: Secondary | ICD-10-CM

## 2022-07-08 DIAGNOSIS — Z7985 Long-term (current) use of injectable non-insulin antidiabetic drugs: Secondary | ICD-10-CM

## 2022-07-08 DIAGNOSIS — E559 Vitamin D deficiency, unspecified: Secondary | ICD-10-CM

## 2022-07-08 MED ORDER — TIRZEPATIDE 10 MG/0.5ML ~~LOC~~ SOAJ: 10.0000 mg | SUBCUTANEOUS | 0 refills | Status: DC

## 2022-07-11 NOTE — Progress Notes (Signed)
Chief Complaint:   OBESITY Dawn Downs is here to discuss her progress with her obesity treatment plan along with follow-up of her obesity related diagnoses. Dawn Downs is on keeping a food journal and adhering to recommended goals of 1700 calories and 130 grams protein and states she is following her eating plan approximately 60% of the time. Dawn Downs states she is walking 45 minutes 3 times per week.  Today's visit was #: 15 Starting weight: 258 lbs Starting date: 09/20/2021 Today's weight: 219 lbs Today's date: 07/08/2022 Total lbs lost to date: 39 Total lbs lost since last in-office visit: 7  Interim History: Dawn Downs had a bout of food poisoning last week but is back to normal now. She is eating foods on plan.  Subjective:   1. Type 2 diabetes mellitus with other circulatory complication, without long-term current use of insulin (HCC) Mounjaro was increased to 7.5 mg from 5.0 mg last OV. Pt feels more hungry now than prior at lower dose, and desires to increase dose.   2. Vitamin D deficiency Pt did discontinue Ergocalciferol last OV and is not only taking OTC Vit D supplement. Vit D level was last checked 06/06/2022.  3. At risk for dehydration Dawn Downs is at risk for dehydration due to inadequate hydration.  Assessment/Plan:  No orders of the defined types were placed in this encounter.   Medications Discontinued During This Encounter  Medication Reason   tirzepatide [facility]) 7.5 MG/0.5ML Pen      Meds ordered this encounter  Medications   tirzepatide (MOUNJARO) 10 MG/0.5ML Pen    Sig: Inject 10 mg into the skin once a week.    Dispense:  2 mL    Refill:  0     1. Type 2 diabetes mellitus with other circulatory complication, without long-term current use of insulin (HCC) Good blood sugar control is important to decrease the likelihood of diabetic complications such as nephropathy, neuropathy, limb loss, blindness, coronary artery disease, and death. Intensive  lifestyle modification including diet, exercise and weight loss are the first line of treatment for diabetes.  Discontinue Mounjaro 7.5 mg and Start Mounjaro 10 mg weekly.  Start- tirzepatide [facility]) 10 MG/0.5ML Pen; Inject 10 mg into the skin once a week.  Dispense: 2 mL; Refill: 0  2. Vitamin D deficiency Low Vitamin D level contributes to fatigue and are associated with obesity, breast, and colon cancer. She agrees to continue to take OTC Vitamin D daily and will follow-up for routine testing of Vitamin D, at least 2-3 times per year to avoid over-replacement. Recheck level in mid September.  3. At risk for dehydration Dawn Downs is at higher than average risk of dehydration.  Eyvonne was given more than 9 minutes of proper hydration counseling today.  We discussed the signs and symptoms of dehydration, some of which may include muscle cramping, constipation or even orthostatic symptoms.  Counseling on the prevention of dehydration was also provided today.  Dawn Downs is at risk for dehydration due to weight loss, lifestyle and behavorial habits and possibly due to taking certain medication(s).  She was encouraged to adequately hydrate and monitor fluid status to avoid dehydration as well as weight loss plateaus.  Unless pre-existing renal or cardiopulmonary conditions exist, in which patient was told to limit their fluid intake, I recommended roughly one half of their weight in pounds to be the approximate ounces of non-caloric, non-caffeinated beverages they should drink per day; including more if they are engaging in exercise.  Dawn Downs is at  higher than average risk of dehydration.  Dawn Downs was given more than 9 minutes of proper hydration counseling today.  We discussed the signs and symptoms of dehydration, some of which may include muscle cramping, constipation, or even orthostatic symptoms.  Counseling on the prevention of dehydration was also provided today.  Dawn Downs is at risk for  dehydration due to weight loss, lifestyle and behavorial habits, and possibly due to taking certain medication(s).  She was encouraged to adequately hydrate and monitor fluid status to avoid dehydration as well as weight loss plateaus.  Unless pre-existing renal or cardiopulmonary conditions exist, which pt was told to limit their fluid intake.  I recommended roughly one half of their weight in pounds to be the approximate ounces of non-caloric, non-caffeinated beverages they should drink per day; including more if they are engaging in exercise.  4. Obesity, Current BMI 37.6 Dawn Downs is currently in the action stage of change. As such, her goal is to continue with weight loss efforts. She has agreed to keeping a food journal and adhering to recommended goals of 1700 calories and 130 grams protein.   Exercise goals:  As is  Behavioral modification strategies: increasing lean protein intake, decreasing simple carbohydrates, and planning for success.  Dawn Downs has agreed to follow-up with our clinic in 3 weeks. She was informed of the importance of frequent follow-up visits to maximize her success with intensive lifestyle modifications for her multiple health conditions.   Objective:   Blood pressure (!) 143/82, pulse 100, temperature 97.9 F (36.6 C), height 5\' 4"  (1.626 m), weight 219 lb (99.3 kg), SpO2 97 %. Body mass index is 37.59 kg/m.  General: Cooperative, alert, well developed, in no acute distress. HEENT: Conjunctivae and lids unremarkable. Cardiovascular: Regular rhythm.  Lungs: Normal work of breathing. Neurologic: No focal deficits.   Lab Results  Component Value Date   CREATININE 0.74 06/06/2022   BUN 19 06/06/2022   NA 138 06/06/2022   K 4.5 06/06/2022   CL 104 06/06/2022   CO2 26 06/06/2022   Lab Results  Component Value Date   ALT 16 06/06/2022   AST 13 06/06/2022   ALKPHOS 64 06/06/2022   BILITOT 0.5 06/06/2022   Lab Results  Component Value Date   HGBA1C 6.2  06/06/2022   HGBA1C 7.0 (H) 03/04/2022   HGBA1C 6.8 (H) 12/03/2021   HGBA1C 10.3 (H) 08/31/2021   HGBA1C 6.4 (H) 12/21/2020   Lab Results  Component Value Date   INSULIN 27.9 (H) 09/20/2021   Lab Results  Component Value Date   TSH 1.44 06/06/2022   Lab Results  Component Value Date   CHOL 123 06/06/2022   HDL 33.70 (L) 06/06/2022   LDLCALC 74 06/06/2022   TRIG 76.0 06/06/2022   CHOLHDL 4 06/06/2022   Lab Results  Component Value Date   VD25OH 101.01 (HH) 06/06/2022   VD25OH 36.10 03/04/2022   VD25OH 55.3 12/10/2021   Lab Results  Component Value Date   WBC 10.4 06/06/2022   HGB 12.9 06/06/2022   HCT 38.7 06/06/2022   MCV 74.7 (L) 06/06/2022   PLT 360.0 06/06/2022    Attestation Statements:   Reviewed by clinician on day of visit: allergies, medications, problem list, medical history, surgical history, family history, social history, and previous encounter notes.  I, 08/06/2022, BS, CMA, am acting as transcriptionist for Kyung Rudd, DO.  I have reviewed the above documentation for accuracy and completeness, and I agree with the above. [employer], D.O.  The  [place] was signed into law in 2016 which includes the topic of electronic health records.  This provides immediate access to information in MyChart.  This includes consultation notes, operative notes, office notes, lab results and pathology reports.  If you have any questions about what you read please let us know at your next visit so we can discuss your concerns and take corrective action if need be.  We are right here with you.

## 2022-08-01 ENCOUNTER — Other Ambulatory Visit: Payer: Self-pay | Admitting: Family Medicine

## 2022-08-01 DIAGNOSIS — E1165 Type 2 diabetes mellitus with hyperglycemia: Secondary | ICD-10-CM

## 2022-08-05 ENCOUNTER — Ambulatory Visit (INDEPENDENT_AMBULATORY_CARE_PROVIDER_SITE_OTHER): Payer: Commercial Managed Care - PPO | Admitting: Family Medicine

## 2022-08-05 ENCOUNTER — Encounter (INDEPENDENT_AMBULATORY_CARE_PROVIDER_SITE_OTHER): Payer: Self-pay | Admitting: Family Medicine

## 2022-08-05 VITALS — BP 114/81 | HR 101 | Temp 98.0°F | Ht 64.0 in | Wt 211.0 lb

## 2022-08-05 DIAGNOSIS — E1169 Type 2 diabetes mellitus with other specified complication: Secondary | ICD-10-CM | POA: Diagnosis not present

## 2022-08-05 DIAGNOSIS — E669 Obesity, unspecified: Secondary | ICD-10-CM

## 2022-08-05 DIAGNOSIS — Z7985 Long-term (current) use of injectable non-insulin antidiabetic drugs: Secondary | ICD-10-CM

## 2022-08-05 DIAGNOSIS — Z6836 Body mass index (BMI) 36.0-36.9, adult: Secondary | ICD-10-CM | POA: Diagnosis not present

## 2022-08-05 DIAGNOSIS — E66813 Obesity, class 3: Secondary | ICD-10-CM

## 2022-08-05 DIAGNOSIS — Z9189 Other specified personal risk factors, not elsewhere classified: Secondary | ICD-10-CM

## 2022-08-05 MED ORDER — TIRZEPATIDE 10 MG/0.5ML ~~LOC~~ SOAJ: 10.0000 mg | SUBCUTANEOUS | 0 refills | Status: DC

## 2022-08-07 ENCOUNTER — Encounter (INDEPENDENT_AMBULATORY_CARE_PROVIDER_SITE_OTHER): Payer: Self-pay

## 2022-08-10 NOTE — Progress Notes (Unsigned)
Chief Complaint:   OBESITY Dawn Downs is here to discuss her progress with her obesity treatment plan along with follow-up of her obesity related diagnoses. Dawn Downs is on keeping a food journal and adhering to recommended goals of 1700 calories and 130 grams protein and states she is following her eating plan approximately 75% of the time. Dawn Downs states she is walking and strength training 15-45 minutes 3-4 times per week.  Today's visit was #: 16 Starting weight: 258 lbs Starting date: 09/20/2021 Today's weight: 211 lbs Today's date: 08/05/2022 Total lbs lost to date: 47 Total lbs lost since last in-office visit: 8  Interim History: Dawn Downs is following category 3 mostly and journals sometimes. She started doing more strength training as well. Dawn Downs is here for a follow up office visit.  We reviewed her meal plan and all questions were answered. Patient's food recall appears to be accurate and consistent with what is on plan when she is following it. When eating on plan, her hunger and cravings are well controlled.    Subjective:   1. Type 2 diabetes mellitus with other specified complication, without long-term current use of insulin (HCC) Discussed labs with patient today. Dawn Downs takes [place] on Friday nights. She reports good control of hunger and has no cravings. Medication: Mounjaro, Metformin  2. At risk for deficient intake of food Dawn Downs is at risk for deficient intake of food due to occasionally skipping meals.  Assessment/Plan:  No orders of the defined types were placed in this encounter.   Medications Discontinued During This Encounter  Medication Reason   tirzepatide [facility]) 10 MG/0.5ML Pen Reorder     Meds ordered this encounter  Medications   tirzepatide (MOUNJARO) 10 MG/0.5ML Pen    Sig: Inject 10 mg into the skin once a week.    Dispense:  2 mL    Refill:  0     1. Type 2 diabetes mellitus with other specified complication, without  long-term current use of insulin (HCC) Good blood sugar control is important to decrease the likelihood of diabetic complications such as nephropathy, neuropathy, limb loss, blindness, coronary artery disease, and death. Intensive lifestyle modification including diet, exercise and weight loss are the first line of treatment for diabetes. Pt desires to increase Mounjaro but upon discussion and reassessment, we will keep her at 10 mg for 30 days. A1c is at goal and was over 10 when she started the program.  Refill- tirzepatide (MOUNJARO) 10 MG/0.5ML Pen; Inject 10 mg into the skin once a week.  Dispense: 2 mL; Refill: 0  2. At risk for deficient intake of food Dawn Downs was given extensive education and counseling today of more than 9 minutes on risks associated with deficient food intake.  Counseled her on the importance of following our prescribed meal plan and eating adequate amounts of protein.  Discussed with Dawn Downs that inadequate food intake over longer periods of time can slow their metabolism down significantly.   3. Obesity, Current BMI 36.4 Dawn Downs is currently in the action stage of change. As such, her goal is to continue with weight loss efforts. She has agreed to the Category 3 Plan or keeping a food journal and adhering to recommended goals of 1700 calories and 130 grams protein.   Exercise goals:  As is  Behavioral modification strategies: no skipping meals, avoiding temptations, and planning for success.  Dawn Downs has agreed to follow-up with our clinic in 3-4 weeks. She was informed of the importance  of frequent follow-up visits to maximize her success with intensive lifestyle modifications for her multiple health conditions.   Objective:   Blood pressure 114/81, pulse (!) 101, temperature 98 F (36.7 C), height 5\' 4"  (1.626 m), weight 211 lb (95.7 kg), SpO2 98 %. Body mass index is 36.22 kg/m.  General: Cooperative, alert, well developed, in no acute  distress. HEENT: Conjunctivae and lids unremarkable. Cardiovascular: Regular rhythm.  Lungs: Normal work of breathing. Neurologic: No focal deficits.   Lab Results  Component Value Date   CREATININE 0.74 06/06/2022   BUN 19 06/06/2022   NA 138 06/06/2022   K 4.5 06/06/2022   CL 104 06/06/2022   CO2 26 06/06/2022   Lab Results  Component Value Date   ALT 16 06/06/2022   AST 13 06/06/2022   ALKPHOS 64 06/06/2022   BILITOT 0.5 06/06/2022   Lab Results  Component Value Date   HGBA1C 6.2 06/06/2022   HGBA1C 7.0 (H) 03/04/2022   HGBA1C 6.8 (H) 12/03/2021   HGBA1C 10.3 (H) 08/31/2021   HGBA1C 6.4 (H) 12/21/2020   Lab Results  Component Value Date   INSULIN 27.9 (H) 09/20/2021   Lab Results  Component Value Date   TSH 1.44 06/06/2022   Lab Results  Component Value Date   CHOL 123 06/06/2022   HDL 33.70 (L) 06/06/2022   LDLCALC 74 06/06/2022   TRIG 76.0 06/06/2022   CHOLHDL 4 06/06/2022   Lab Results  Component Value Date   VD25OH 101.01 (HH) 06/06/2022   VD25OH 36.10 03/04/2022   VD25OH 55.3 12/10/2021   Lab Results  Component Value Date   WBC 10.4 06/06/2022   HGB 12.9 06/06/2022   HCT 38.7 06/06/2022   MCV 74.7 (L) 06/06/2022   PLT 360.0 06/06/2022    Attestation Statements:   Reviewed by clinician on day of visit: allergies, medications, problem list, medical history, surgical history, family history, social history, and previous encounter notes.  I, 08/06/2022, BS, CMA, am acting as transcriptionist for Kyung Rudd, DO.   I have reviewed the above documentation for accuracy and completeness, and I agree with the above. [employer], D.O.  The 21st Century Cures Act was signed into law in 2016 which includes the topic of electronic health records.  This provides immediate access to information in MyChart.  This includes consultation notes, operative notes, office notes, lab results and pathology reports.  If you have any questions about  what you read please let 2017 know at your next visit so we can discuss your concerns and take corrective action if need be.  We are right here with you.

## 2022-09-05 ENCOUNTER — Ambulatory Visit (INDEPENDENT_AMBULATORY_CARE_PROVIDER_SITE_OTHER): Payer: Commercial Managed Care - PPO | Admitting: Family Medicine

## 2022-09-05 ENCOUNTER — Encounter (INDEPENDENT_AMBULATORY_CARE_PROVIDER_SITE_OTHER): Payer: Self-pay | Admitting: Family Medicine

## 2022-09-05 VITALS — BP 113/78 | HR 80 | Temp 97.6°F | Ht 64.0 in | Wt 210.0 lb

## 2022-09-05 DIAGNOSIS — Z7985 Long-term (current) use of injectable non-insulin antidiabetic drugs: Secondary | ICD-10-CM | POA: Diagnosis not present

## 2022-09-05 DIAGNOSIS — E1169 Type 2 diabetes mellitus with other specified complication: Secondary | ICD-10-CM

## 2022-09-05 DIAGNOSIS — Z9189 Other specified personal risk factors, not elsewhere classified: Secondary | ICD-10-CM

## 2022-09-05 DIAGNOSIS — Z6841 Body Mass Index (BMI) 40.0 and over, adult: Secondary | ICD-10-CM

## 2022-09-05 MED ORDER — TIRZEPATIDE 12.5 MG/0.5ML ~~LOC~~ SOAJ: 12.5000 mg | SUBCUTANEOUS | 0 refills | Status: DC

## 2022-09-09 ENCOUNTER — Encounter: Payer: Self-pay | Admitting: Family Medicine

## 2022-09-09 ENCOUNTER — Ambulatory Visit: Payer: Commercial Managed Care - PPO | Admitting: Family Medicine

## 2022-09-09 ENCOUNTER — Other Ambulatory Visit: Payer: Self-pay

## 2022-09-09 VITALS — BP 128/74 | HR 88 | Temp 97.6°F | Resp 18 | Ht 64.0 in | Wt 211.2 lb

## 2022-09-09 DIAGNOSIS — Z23 Encounter for immunization: Secondary | ICD-10-CM

## 2022-09-09 DIAGNOSIS — E119 Type 2 diabetes mellitus without complications: Secondary | ICD-10-CM | POA: Diagnosis not present

## 2022-09-09 LAB — BASIC METABOLIC PANEL
BUN: 16 mg/dL (ref 6–23)
CO2: 26 mEq/L (ref 19–32)
Calcium: 9.5 mg/dL (ref 8.4–10.5)
Chloride: 103 mEq/L (ref 96–112)
Creatinine, Ser: 0.67 mg/dL (ref 0.40–1.20)
GFR: 104.78 mL/min (ref >60.00)
Glucose, Bld: 75 mg/dL (ref 70–99)
Potassium: 3.9 mEq/L (ref 3.5–5.1)
Sodium: 138 mEq/L (ref 135–145)

## 2022-09-09 LAB — HEMOGLOBIN A1C: Hgb A1c MFr Bld: 5.5 % (ref 4.6–6.5)

## 2022-09-09 NOTE — Progress Notes (Signed)
   Subjective:    Patient ID: Dawn Downs, female    DOB: Mar 23, 1976, 46 y.o.   MRN: [id number]  HPI DM- chronic problem, on Mounjaro 12.5mg /0.94ml weekly, Metformin XR 500mg  BID.  Due for eye exam.  Due for microalbumin.  UTD on foot exam.  No CP, SOB, HAs, visual changes, abd pain, N/V.  Denies symptomatic lows.  No numbness/tingling of hands/feet.  Obesity- pt is down 18 lbs since last visit.  Pt is working on low carb diet, regular exercise in addition to taking Mounjaro.   Review of Systems For ROS see HPI     Objective:   Physical Exam Vitals reviewed.  Constitutional:      General: She is not in acute distress.    Appearance: Normal appearance. She is well-developed. She is obese. She is not ill-appearing.  HENT:     Head: Normocephalic and atraumatic.  Eyes:     Conjunctiva/sclera: Conjunctivae normal.     Pupils: Pupils are equal, round, and reactive to light.  Neck:     Thyroid: No thyromegaly.  Cardiovascular:     Rate and Rhythm: Normal rate and regular rhythm.     Pulses: Normal pulses.     Heart sounds: Normal heart sounds. No murmur heard. Pulmonary:     Effort: Pulmonary effort is normal. No respiratory distress.     Breath sounds: Normal breath sounds.  Abdominal:     General: There is no distension.     Palpations: Abdomen is soft.     Tenderness: There is no abdominal tenderness.  Musculoskeletal:     Cervical back: Normal range of motion and neck supple.     Right lower leg: No edema.     Left lower leg: No edema.  Lymphadenopathy:     Cervical: No cervical adenopathy.  Skin:    General: Skin is warm and dry.  Neurological:     General: No focal deficit present.     Mental Status: She is alert and oriented to person, place, and time.  Psychiatric:        Mood and Affect: Mood normal.        Behavior: Behavior normal.        Thought Content: Thought content normal.           Assessment & Plan:

## 2022-09-09 NOTE — Assessment & Plan Note (Signed)
Ongoing issue for pt.  Down 18 lbs since last visit.  Applauded her efforts at healthy diet and regular exercise.  Will continue to follow.

## 2022-09-09 NOTE — Assessment & Plan Note (Signed)
Chronic problem.  Currently tolerating Metformin XR 500mg  BID and Mounjaro weekly w/o difficulty.  Due for eye exam- pt to schedule.  Microalbumin ordered.  Foot exam UTD.  Check labs.  Adjust meds prn

## 2022-09-09 NOTE — Patient Instructions (Signed)
Follow up in 3-4 months to recheck sugar and cholesterol We'll notify you of your lab results and make any changes if needed Continue to work on healthy diet and regular exercise- you're doing great!!! Call with any questions or concerns Stay Safe!  Stay Healthy!

## 2022-09-12 NOTE — Progress Notes (Unsigned)
Chief Complaint:   OBESITY Dawn Downs is here to discuss her progress with her obesity treatment plan along with follow-up of her obesity related diagnoses. Lakhia is on the Category 3 Plan and keeping a food journal and adhering to recommended goals of 1700 calories and 130 grams protein and states she is following her eating plan approximately 60-70% of the time. [place] states she is walking and weight training 45 minutes 2-3 times per week.  Today's visit was #: 17 Starting weight: 258 lbs Starting date: 09/20/2021 Today's weight: 210 lbs Today's date: 09/05/2022 Total lbs lost to date: 48 Total lbs lost since last in-office visit: 1  Interim History: Kailany is working a ton at her full-time job and also at [employer]. She does not have a lot of time for self-care. Pt is not skipping meals but is eating more in general and more on-the-go foods. She is at 60 oz of water daily.  Subjective:   1. Type 2 diabetes mellitus with other specified complication, without long-term current use of insulin (HCC) Pt desires an increase in Mounjaro dose. She feels like med isn't working and she has seen no difference with her blood sugar.  2. At risk for dehydration Shekina is at risk for dehydration due to inadequate intake of water.  Assessment/Plan:  No orders of the defined types were placed in this encounter.   Medications Discontinued During This Encounter  Medication Reason   tirzepatide [facility]) 10 MG/0.5ML Pen      Meds ordered this encounter  Medications   tirzepatide (MOUNJARO) 12.5 MG/0.5ML Pen    Sig: Inject 12.5 mg into the skin once a week. Every thursday    Dispense:  2 mL    Refill:  0     1. Type 2 diabetes mellitus with other specified complication, without long-term current use of insulin (HCC) Good blood sugar control is important to decrease the likelihood of diabetic complications such as nephropathy, neuropathy, limb loss, blindness, coronary artery  disease, and death. Intensive lifestyle modification including diet, exercise and weight loss are the first line of treatment for diabetes. Increase Mounjaro to 12.5 mg.  Increase & Refill- tirzepatide (MOUNJARO) 12.5 MG/0.5ML Pen; Inject 12.5 mg into the skin once a week. Every thursday  Dispense: 2 mL; Refill: 0  2. At risk for dehydration Eileene is at higher than average risk of dehydration.  Rondia was given more than 15 minutes of proper hydration counseling today.  We discussed the signs and symptoms of dehydration, some of which may include muscle cramping, constipation or even orthostatic symptoms.  Counseling on the prevention of dehydration was also provided today.  Sonakshi is at risk for dehydration due to weight loss, lifestyle and behavorial habits and possibly due to taking certain medication(s).  She was encouraged to adequately hydrate and monitor fluid status to avoid dehydration as well as weight loss plateaus.  Unless pre-existing renal or cardiopulmonary conditions exist, in which patient was told to limit their fluid intake, I recommended roughly one half of their weight in pounds to be the approximate ounces of non-caloric, non-caffeinated beverages they should drink per day; including more if they are engaging in exercise.  Alfreda is at higher than average risk of dehydration.  Lindell was given more than 9 minutes of proper hydration counseling today.  We discussed the signs and symptoms of dehydration, some of which may include muscle cramping, constipation, or even orthostatic symptoms.  Counseling on the prevention of dehydration was also  provided today.  Minh is at risk for dehydration due to weight loss, lifestyle and behavorial habits, and possibly due to taking certain medication(s).  She was encouraged to adequately hydrate and monitor fluid status to avoid dehydration as well as weight loss plateaus.  Unless pre-existing renal or cardiopulmonary conditions exist,  which pt was told to limit their fluid intake.  I recommended roughly one half of their weight in pounds to be the approximate ounces of non-caloric, non-caffeinated beverages they should drink per day; including more if they are engaging in exercise.  3. Obesity, current BMI 36.1 Kagan is currently in the action stage of change. As such, her goal is to continue with weight loss efforts. She has agreed to the Category 3 Plan and keeping a food journal and adhering to recommended goals of 1700 calories and 130 grams protein.   Get back on plan. Category 3 handout given to pt. Strategies for meal prep and planning discussed.  Exercise goals:  As is  Behavioral modification strategies: increasing lean protein intake, decreasing simple carbohydrates, and no skipping meals.  Ski has agreed to follow-up with our clinic in 3-4 weeks. She was informed of the importance of frequent follow-up visits to maximize her success with intensive lifestyle modifications for her multiple health conditions.   Objective:   Blood pressure 113/78, pulse 80, temperature 97.6 F (36.4 C), height 5\' 4"  (1.626 m), weight 210 lb (95.3 kg), SpO2 100 %. Body mass index is 36.05 kg/m.  General: Cooperative, alert, well developed, in no acute distress. HEENT: Conjunctivae and lids unremarkable. Cardiovascular: Regular rhythm.  Lungs: Normal work of breathing. Neurologic: No focal deficits.   Lab Results  Component Value Date   CREATININE 0.67 09/09/2022   BUN 16 09/09/2022   NA 138 09/09/2022   K 3.9 09/09/2022   CL 103 09/09/2022   CO2 26 09/09/2022   Lab Results  Component Value Date   ALT 16 06/06/2022   AST 13 06/06/2022   ALKPHOS 64 06/06/2022   BILITOT 0.5 06/06/2022   Lab Results  Component Value Date   HGBA1C 5.5 09/09/2022   HGBA1C 6.2 06/06/2022   HGBA1C 7.0 (H) 03/04/2022   HGBA1C 6.8 (H) 12/03/2021   HGBA1C 10.3 (H) 08/31/2021   Lab Results  Component Value Date   INSULIN 27.9  (H) 09/20/2021   Lab Results  Component Value Date   TSH 1.44 06/06/2022   Lab Results  Component Value Date   CHOL 123 06/06/2022   HDL 33.70 (L) 06/06/2022   LDLCALC 74 06/06/2022   TRIG 76.0 06/06/2022   CHOLHDL 4 06/06/2022   Lab Results  Component Value Date   VD25OH 101.01 (HH) 06/06/2022   VD25OH 36.10 03/04/2022   VD25OH 55.3 12/10/2021   Lab Results  Component Value Date   WBC 10.4 06/06/2022   HGB 12.9 06/06/2022   HCT 38.7 06/06/2022   MCV 74.7 (L) 06/06/2022   PLT 360.0 06/06/2022     Attestation Statements:   Reviewed by clinician on day of visit: allergies, medications, problem list, medical history, surgical history, family history, social history, and previous encounter notes.  I, 08/06/2022, BS, CMA, am acting as transcriptionist for Kyung Rudd, DO.  I have reviewed the above documentation for accuracy and completeness, and I agree with the above. [employer], D.O.  The 21st Century Cures Act was signed into law in 2016 which includes the topic of electronic health records.  This provides immediate access to information in MyChart.  This includes consultation notes, operative notes, office notes, lab results and pathology reports.  If you have any questions about what you read please let us know at your next visit so we can discuss your concerns and take corrective action if need be.  We are right here with you.

## 2022-09-16 ENCOUNTER — Other Ambulatory Visit (INDEPENDENT_AMBULATORY_CARE_PROVIDER_SITE_OTHER): Payer: Self-pay | Admitting: Family Medicine

## 2022-10-03 ENCOUNTER — Encounter (INDEPENDENT_AMBULATORY_CARE_PROVIDER_SITE_OTHER): Payer: Self-pay | Admitting: Family Medicine

## 2022-10-03 ENCOUNTER — Ambulatory Visit (INDEPENDENT_AMBULATORY_CARE_PROVIDER_SITE_OTHER): Payer: Commercial Managed Care - PPO | Admitting: Family Medicine

## 2022-10-03 VITALS — BP 139/83 | HR 90 | Temp 98.0°F | Ht 64.0 in | Wt 203.0 lb

## 2022-10-03 DIAGNOSIS — E559 Vitamin D deficiency, unspecified: Secondary | ICD-10-CM

## 2022-10-03 DIAGNOSIS — E669 Obesity, unspecified: Secondary | ICD-10-CM | POA: Diagnosis not present

## 2022-10-03 DIAGNOSIS — E785 Hyperlipidemia, unspecified: Secondary | ICD-10-CM | POA: Diagnosis not present

## 2022-10-03 DIAGNOSIS — Z6834 Body mass index (BMI) 34.0-34.9, adult: Secondary | ICD-10-CM

## 2022-10-03 DIAGNOSIS — E1169 Type 2 diabetes mellitus with other specified complication: Secondary | ICD-10-CM

## 2022-10-03 DIAGNOSIS — E119 Type 2 diabetes mellitus without complications: Secondary | ICD-10-CM | POA: Insufficient documentation

## 2022-10-03 DIAGNOSIS — Z7985 Long-term (current) use of injectable non-insulin antidiabetic drugs: Secondary | ICD-10-CM

## 2022-10-03 DIAGNOSIS — K760 Fatty (change of) liver, not elsewhere classified: Secondary | ICD-10-CM | POA: Insufficient documentation

## 2022-10-03 MED ORDER — TIRZEPATIDE 12.5 MG/0.5ML ~~LOC~~ SOAJ: 12.5000 mg | SUBCUTANEOUS | 0 refills | Status: DC

## 2022-10-09 ENCOUNTER — Telehealth (INDEPENDENT_AMBULATORY_CARE_PROVIDER_SITE_OTHER): Payer: Self-pay | Admitting: Family Medicine

## 2022-10-09 LAB — VITAMIN D 25 HYDROXY (VIT D DEFICIENCY, FRACTURES)

## 2022-10-09 NOTE — Telephone Encounter (Signed)
[facility] called and stated that this patient's vitamin D 25 specimen was lost and it needs to be recollected. The number for [facility] is [phone] ext. [phone].

## 2022-10-10 NOTE — Telephone Encounter (Signed)
Yes, and I called and spoke with her, she will come in next week.

## 2022-10-24 ENCOUNTER — Ambulatory Visit (INDEPENDENT_AMBULATORY_CARE_PROVIDER_SITE_OTHER): Payer: Commercial Managed Care - PPO | Admitting: Family Medicine

## 2022-10-24 ENCOUNTER — Encounter (INDEPENDENT_AMBULATORY_CARE_PROVIDER_SITE_OTHER): Payer: Self-pay | Admitting: Family Medicine

## 2022-10-24 VITALS — BP 121/86 | HR 87 | Temp 97.8°F | Ht 64.0 in | Wt 199.0 lb

## 2022-10-24 DIAGNOSIS — Z6834 Body mass index (BMI) 34.0-34.9, adult: Secondary | ICD-10-CM | POA: Diagnosis not present

## 2022-10-24 DIAGNOSIS — Z7985 Long-term (current) use of injectable non-insulin antidiabetic drugs: Secondary | ICD-10-CM

## 2022-10-24 DIAGNOSIS — E669 Obesity, unspecified: Secondary | ICD-10-CM

## 2022-10-24 DIAGNOSIS — E1169 Type 2 diabetes mellitus with other specified complication: Secondary | ICD-10-CM

## 2022-10-24 MED ORDER — TIRZEPATIDE 12.5 MG/0.5ML ~~LOC~~ SOAJ: 12.5000 mg | SUBCUTANEOUS | 0 refills | Status: DC

## 2022-10-28 DIAGNOSIS — Z6841 Body Mass Index (BMI) 40.0 and over, adult: Secondary | ICD-10-CM | POA: Insufficient documentation

## 2022-11-04 NOTE — Progress Notes (Unsigned)
Chief Complaint:   OBESITY Dawn Downs is here to discuss her progress with her obesity treatment plan along with follow-up of her obesity related diagnoses. Dawn Downs is on the Category 3 Plan and keeping a food journal and adhering to recommended goals of 1700 calories and 130 grams protein and states she is following her eating plan approximately 50% of the time. Dawn Downs states she is not currently exercising.  Today's visit was #: 18 Starting weight: 258 lbs Starting date: 09/20/2021 Today's weight: 203 lbs Today's date: 10/03/2022 Total lbs lost to date: 55 Total lbs lost since last in-office visit: 7  Interim History: Dawn Downs paid more attention to eating more proteins. She doesn't track/journal MyFitnessPal but does track on weight watchers app. She denies issues with meal plan. Pt denies hunger or cravings when eating on plan. Pt even [place] the fair and can't believe she did so well. Pt had labs with PCP on 09/09/2022, which she asked me to review with her today.  Subjective:   1. Type 2 diabetes mellitus with other specified complication, without long-term current use of insulin (HCC) Discussed labs with patient today. Last OV we increased Mounjaro dose and it is working well with no side effects. Her A1c at the start of the program was 10.3 and is now 5.5 as of 09/09/2022.  2. Hyperlipidemia associated with type 2 diabetes mellitus (HCC) Discussed labs with patient today. Dawn Downs's LDL was 74 and HDL 33.7 on 06/06/2022. She is on Crestor 10 mg nightly by PCP.  3. Vitamin D deficiency Dawn Downs's last Vit D check was 101 and too high. She is on OTC supplementation but unsure of dose.  Assessment/Plan:   Orders Placed This Encounter  Procedures   VITAMIN D 25 Hydroxy (Vit-D Deficiency, Fractures)   VITAMIN D 25 Hydroxy (Vit-D Deficiency, Fractures)    Medications Discontinued During This Encounter  Medication Reason   tirzepatide (MOUNJARO) 12.5 MG/0.5ML Pen Reorder      Meds ordered this encounter  Medications   DISCONTD: tirzepatide (MOUNJARO) 12.5 MG/0.5ML Pen    Sig: Inject 12.5 mg into the skin once a week. Every thursday    Dispense:  2 mL    Refill:  0     1. Type 2 diabetes mellitus with other specified complication, without long-term current use of insulin (HCC) A1c at goal- great. Good blood sugar control is important to decrease the likelihood of diabetic complications such as nephropathy, neuropathy, limb loss, blindness, coronary artery disease, and death. Intensive lifestyle modification including diet, exercise and weight loss are the first line of treatment for diabetes.  Continue prudent nutritional plan and weight loss. Continue Metformin as well per PCP.  2. Hyperlipidemia associated with type 2 diabetes mellitus (HCC) Cardiovascular risk and specific lipid/LDL goals reviewed.  We discussed several lifestyle modifications today and Dawn Downs will continue to work on diet, exercise and weight loss efforts. Orders and follow up as documented in patient record.  Pt reminded of the importance of increasing Omega 3 and 6's and exercise to improve HDL. Continue Crestor and prudent nutritional plan.  Counseling Intensive lifestyle modifications are the first line treatment for this issue. Dietary changes: Increase soluble fiber. Decrease simple carbohydrates. Exercise changes: Moderate to vigorous-intensity aerobic activity 150 minutes per week if tolerated. Lipid-lowering medications: see documented in medical record.  3. Vitamin D deficiency Low Vitamin D level contributes to fatigue and are associated with obesity, breast, and colon cancer. She agrees to continue prudent nutritional plan and weight  loss and will follow-up for routine testing of Vitamin D, at least 2-3 times per year to avoid over-replacement. Continue weight bearing exercise.  Lab/Orders today or future: - VITAMIN D 25 Hydroxy (Vit-D Deficiency, Fractures) - VITAMIN D 25  Hydroxy (Vit-D Deficiency, Fractures); Future  4. Obesity, current BMI 34.9 Dawn Downs is currently in the action stage of change. As such, her goal is to continue with weight loss efforts. She has agreed to the Category 3 Plan and keeping a food journal and adhering to recommended goals of 1700 calories and 130 grams protein.   Increase adherence to meal plan by 10% or more.  Exercise goals:  As is  Behavioral modification strategies: planning for success.  Dawn Downs has agreed to follow-up with our clinic in 3 weeks. She was informed of the importance of frequent follow-up visits to maximize her success with intensive lifestyle modifications for her multiple health conditions.   Objective:   Blood pressure 139/83, pulse 90, temperature 98 F (36.7 C), height 5\' 4"  (1.626 m), weight 203 lb (92.1 kg), SpO2 100 %. Body mass index is 34.84 kg/m.  General: Cooperative, alert, well developed, in no acute distress. HEENT: Conjunctivae and lids unremarkable. Cardiovascular: Regular rhythm.  Lungs: Normal work of breathing. Neurologic: No focal deficits.   Lab Results  Component Value Date   CREATININE 0.67 09/09/2022   BUN 16 09/09/2022   NA 138 09/09/2022   K 3.9 09/09/2022   CL 103 09/09/2022   CO2 26 09/09/2022   Lab Results  Component Value Date   ALT 16 06/06/2022   AST 13 06/06/2022   ALKPHOS 64 06/06/2022   BILITOT 0.5 06/06/2022   Lab Results  Component Value Date   HGBA1C 5.5 09/09/2022   HGBA1C 6.2 06/06/2022   HGBA1C 7.0 (H) 03/04/2022   HGBA1C 6.8 (H) 12/03/2021   HGBA1C 10.3 (H) 08/31/2021   Lab Results  Component Value Date   INSULIN 27.9 (H) 09/20/2021   Lab Results  Component Value Date   TSH 1.44 06/06/2022   Lab Results  Component Value Date   CHOL 123 06/06/2022   HDL 33.70 (L) 06/06/2022   LDLCALC 74 06/06/2022   TRIG 76.0 06/06/2022   CHOLHDL 4 06/06/2022   Lab Results  Component Value Date   VD25OH CANCELED 10/03/2022   VD25OH 101.01  (HH) 06/06/2022   VD25OH 36.10 03/04/2022   Lab Results  Component Value Date   WBC 10.4 06/06/2022   HGB 12.9 06/06/2022   HCT 38.7 06/06/2022   MCV 74.7 (L) 06/06/2022   PLT 360.0 06/06/2022    Attestation Statements:   Reviewed by clinician on day of visit: allergies, medications, problem list, medical history, surgical history, family history, social history, and previous encounter notes.  I, Kathlene November, BS, CMA, am acting as transcriptionist for [employer], DO.  I have reviewed the above documentation for accuracy and completeness, and I agree with the above. Dawn Downs, D.O.  The [place] was signed into law in 2016 which includes the topic of electronic health records.  This provides immediate access to information in MyChart.  This includes consultation notes, operative notes, office notes, lab results and pathology reports.  If you have any questions about what you read please let us know at your next visit so we can discuss your concerns and take corrective action if need be.  We are right here with you.

## 2022-11-05 NOTE — Progress Notes (Signed)
Chief Complaint:   OBESITY Dawn Downs is here to discuss her progress with her obesity treatment plan along with follow-up of her obesity related diagnoses. Dawn Downs is on the Category 3 Plan and keeping a food journal and adhering to recommended goals of 1700 calories and 130 grams of protein and states she is following her eating plan approximately 60-70% of the time. Dawn Downs states she is not currently exercising.  Today's visit was #: 19 Starting weight: 258 lbs Starting date: 09/20/2021 Today's weight: 199 lbs Today's date: 10/24/2022 Total lbs lost to date: 59 lbs Total lbs lost since last in-office visit: 4  Interim History: Dawn Downs notes, "My focus was not to gain weight".  [place] the [place] and the [place] fair and she used PC/. She lost 6 lbs in fat and gained almost 2 lbs in muscle.   Subjective:   1. Diabetes mellitus type 2 in obese [facility]) Veda is not checking blood sugars at home. Denies any issues.  Assessment/Plan:  No orders of the defined types were placed in this encounter.   Medications Discontinued During This Encounter  Medication Reason   tirzepatide [facility]) 12.5 MG/0.5ML Pen Reorder     Meds ordered this encounter  Medications   tirzepatide (MOUNJARO) 12.5 MG/0.5ML Pen    Sig: Inject 12.5 mg into the skin once a week. Every thursday    Dispense:  2 mL    Refill:  0     1. Diabetes mellitus type 2 in obese (HCC) We will refill Mounjaro 12.5 mg once weekly for 1 month with 0 refills.  -Refill tirzepatide (MOUNJARO) 12.5 MG/0.5ML Pen; Inject 12.5 mg into the skin once a week. Every thursday  Dispense: 2 mL; Refill: 0  2. Obesity, current BMI 34.2 Dawn Downs is currently in the action stage of change. As such, her goal is to continue with weight loss efforts. She has agreed to the Category 3 Plan.   Exercise goals: All adults should avoid inactivity. Some physical activity is better than none, and adults who participate in  any amount of physical activity gain some health benefits.  Increase activity level. Dawn Downs will start walking 3 days a week for 20 minutes or more.  Behavioral modification strategies: increasing lean protein intake and planning for success.  Dawn Downs has agreed to follow-up with our clinic in 4 weeks. She was informed of the importance of frequent follow-up visits to maximize her success with intensive lifestyle modifications for her multiple health conditions.   Objective:   Blood pressure 121/86, pulse 87, temperature 97.8 F (36.6 C), height 5\' 4"  (1.626 m), weight 199 lb (90.3 kg), SpO2 99 %. Body mass index is 34.16 kg/m.  General: Cooperative, alert, well developed, in no acute distress. HEENT: Conjunctivae and lids unremarkable. Cardiovascular: Regular rhythm.  Lungs: Normal work of breathing. Neurologic: No focal deficits.   Lab Results  Component Value Date   CREATININE 0.67 09/09/2022   BUN 16 09/09/2022   NA 138 09/09/2022   K 3.9 09/09/2022   CL 103 09/09/2022   CO2 26 09/09/2022   Lab Results  Component Value Date   ALT 16 06/06/2022   AST 13 06/06/2022   ALKPHOS 64 06/06/2022   BILITOT 0.5 06/06/2022   Lab Results  Component Value Date   HGBA1C 5.5 09/09/2022   HGBA1C 6.2 06/06/2022   HGBA1C 7.0 (H) 03/04/2022   HGBA1C 6.8 (H) 12/03/2021   HGBA1C 10.3 (H) 08/31/2021   Lab Results  Component Value  Date   INSULIN 27.9 (H) 09/20/2021   Lab Results  Component Value Date   TSH 1.44 06/06/2022   Lab Results  Component Value Date   CHOL 123 06/06/2022   HDL 33.70 (L) 06/06/2022   LDLCALC 74 06/06/2022   TRIG 76.0 06/06/2022   CHOLHDL 4 06/06/2022   Lab Results  Component Value Date   VD25OH CANCELED 10/03/2022   VD25OH 101.01 (HH) 06/06/2022   VD25OH 36.10 03/04/2022   Lab Results  Component Value Date   WBC 10.4 06/06/2022   HGB 12.9 06/06/2022   HCT 38.7 06/06/2022   MCV 74.7 (L) 06/06/2022   PLT 360.0 06/06/2022   No results  found for: "IRON", "TIBC", "FERRITIN"  Attestation Statements:   Reviewed by clinician on day of visit: allergies, medications, problem list, medical history, surgical history, family history, social history, and previous encounter notes.  I, Brendell Tyus, am acting as Location manager for [employer], DO.   I have reviewed the above documentation for accuracy and completeness, and I agree with the above. Marjory Sneddon, D.O.  The [place] was signed into law in 2016 which includes the topic of electronic health records.  This provides immediate access to information in MyChart.  This includes consultation notes, operative notes, office notes, lab results and pathology reports.  If you have any questions about what you read please let us know at your next visit so we can discuss your concerns and take corrective action if need be.  We are right here with you.

## 2022-11-14 ENCOUNTER — Encounter (INDEPENDENT_AMBULATORY_CARE_PROVIDER_SITE_OTHER): Payer: Self-pay | Admitting: Family Medicine

## 2022-11-14 ENCOUNTER — Ambulatory Visit (INDEPENDENT_AMBULATORY_CARE_PROVIDER_SITE_OTHER): Payer: Commercial Managed Care - PPO | Admitting: Family Medicine

## 2022-11-14 VITALS — BP 133/84 | HR 81 | Temp 98.2°F | Ht 64.0 in | Wt 198.4 lb

## 2022-11-14 DIAGNOSIS — Z6841 Body Mass Index (BMI) 40.0 and over, adult: Secondary | ICD-10-CM

## 2022-11-14 DIAGNOSIS — E119 Type 2 diabetes mellitus without complications: Secondary | ICD-10-CM

## 2022-11-14 DIAGNOSIS — E1159 Type 2 diabetes mellitus with other circulatory complications: Secondary | ICD-10-CM | POA: Diagnosis not present

## 2022-11-14 DIAGNOSIS — R748 Abnormal levels of other serum enzymes: Secondary | ICD-10-CM | POA: Diagnosis not present

## 2022-11-14 DIAGNOSIS — I10 Essential (primary) hypertension: Secondary | ICD-10-CM | POA: Insufficient documentation

## 2022-11-14 DIAGNOSIS — Z6834 Body mass index (BMI) 34.0-34.9, adult: Secondary | ICD-10-CM | POA: Insufficient documentation

## 2022-11-14 DIAGNOSIS — E1169 Type 2 diabetes mellitus with other specified complication: Secondary | ICD-10-CM | POA: Insufficient documentation

## 2022-11-14 DIAGNOSIS — I152 Hypertension secondary to endocrine disorders: Secondary | ICD-10-CM

## 2022-11-14 DIAGNOSIS — Z7984 Long term (current) use of oral hypoglycemic drugs: Secondary | ICD-10-CM

## 2022-11-14 DIAGNOSIS — E669 Obesity, unspecified: Secondary | ICD-10-CM

## 2022-11-26 ENCOUNTER — Encounter: Payer: Self-pay | Admitting: Family Medicine

## 2022-11-26 ENCOUNTER — Other Ambulatory Visit: Payer: Self-pay

## 2022-11-26 DIAGNOSIS — E669 Obesity, unspecified: Secondary | ICD-10-CM

## 2022-11-27 NOTE — Progress Notes (Signed)
Chief Complaint:   OBESITY Dawn Downs is here to discuss her progress with her obesity treatment plan along with follow-up of her obesity related diagnoses. Dawn Downs is on the Category 3 Plan and states she is following her eating plan approximately 60% of the time. Dawn Downs states she is walking 15-20 minutes 2-3 times per week.  Today's visit was #: 20 Starting weight: 258 lbs Starting date: 09/20/2021 Today's weight: 198 lbs Today's date: 11/14/2022 Total lbs lost to date: 60 lbs Total lbs lost since last in-office visit: 1 lb  Interim History: Got into Halloween candy a little bit and had some today and at work yesterday.  Still lost and doing great.   Subjective:   1. Type 2 diabetes mellitus with obesity (HCC) Good control with hunger and cravings when on plan.    2. Hypertension associated with type 2 diabetes mellitus (HCC) Review: taking medications as instructed, no medication side effects noted, no chest pain on exertion, no dyspnea on exertion, no swelling of ankles.   3. Low serum HDL HDL's in the 30's. (Low 30's).  Last checked 6 months ago.    Assessment/Plan:  No orders of the defined types were placed in this encounter.   There are no discontinued medications.   No orders of the defined types were placed in this encounter.    1. Type 2 diabetes mellitus with obesity (HCC) Continue Mounjaro 12.5 mg.  May consider increase in dose in future to help with holidays.  A1c is at goal 2 months ago.  2. Hypertension associated with type 2 diabetes mellitus (HCC) Dawn Downs is working on healthy weight loss and exercise to improve blood pressure control. We will watch for signs of hypotension as she continues her lifestyle modifications.  Stable continue medications.   3. Low serum HDL Increase exercise, increase Omega 3 fatty acids.  Patient does not eat fish and cannot tolerate fish oil.   4. Obesity, current BMI 34.1 Strategies discussed with patient.    Dawn Downs is currently in the action stage of change. As such, her goal is to continue with weight loss efforts. She has agreed to keeping a food journal and adhering to recommended goals of 1750-1850 calories and 110+ protein.   Exercise goals:  increase exercise as tolerated.   Behavioral modification strategies: increasing lean protein intake and decreasing simple carbohydrates.  Dawn Downs has agreed to follow-up with our clinic in 2-3 weeks. She was informed of the importance of frequent follow-up visits to maximize her success with intensive lifestyle modifications for her multiple health conditions.   Objective:   Blood pressure 133/84, pulse 81, temperature 98.2 F (36.8 C), height 5\' 4"  (1.626 m), weight 198 lb 6.4 oz (90 kg), SpO2 99 %. Body mass index is 34.06 kg/m.  General: Cooperative, alert, well developed, in no acute distress. HEENT: Conjunctivae and lids unremarkable. Cardiovascular: Regular rhythm.  Lungs: Normal work of breathing. Neurologic: No focal deficits.   Lab Results  Component Value Date   CREATININE 0.67 09/09/2022   BUN 16 09/09/2022   NA 138 09/09/2022   K 3.9 09/09/2022   CL 103 09/09/2022   CO2 26 09/09/2022   Lab Results  Component Value Date   ALT 16 06/06/2022   AST 13 06/06/2022   ALKPHOS 64 06/06/2022   BILITOT 0.5 06/06/2022   Lab Results  Component Value Date   HGBA1C 5.5 09/09/2022   HGBA1C 6.2 06/06/2022   HGBA1C 7.0 (H) 03/04/2022   HGBA1C 6.8 (H) 12/03/2021  HGBA1C 10.3 (H) 08/31/2021   Lab Results  Component Value Date   INSULIN 27.9 (H) 09/20/2021   Lab Results  Component Value Date   TSH 1.44 06/06/2022   Lab Results  Component Value Date   CHOL 123 06/06/2022   HDL 33.70 (L) 06/06/2022   LDLCALC 74 06/06/2022   TRIG 76.0 06/06/2022   CHOLHDL 4 06/06/2022   Lab Results  Component Value Date   VD25OH CANCELED 10/03/2022   VD25OH 101.01 (HH) 06/06/2022   VD25OH 36.10 03/04/2022   Lab Results  Component  Value Date   WBC 10.4 06/06/2022   HGB 12.9 06/06/2022   HCT 38.7 06/06/2022   MCV 74.7 (L) 06/06/2022   PLT 360.0 06/06/2022   No results found for: "IRON", "TIBC", "FERRITIN"  Attestation Statements:   Reviewed by clinician on day of visit: allergies, medications, problem list, medical history, surgical history, family history, social history, and previous encounter notes.  I, Malcolm Metro, RMA, am acting as Energy manager for [employer], DO.  I have reviewed the above documentation for accuracy and completeness, and I agree with the above. [name], D.O.  The 21st Century Cures Act was signed into law in 2016 which includes the topic of electronic health records.  This provides immediate access to information in MyChart.  This includes consultation notes, operative notes, office notes, lab results and pathology reports.  If you have any questions about what you read please let us know at your next visit so we can discuss your concerns and take corrective action if need be.  We are right here with you.

## 2022-11-28 ENCOUNTER — Other Ambulatory Visit: Payer: Commercial Managed Care - PPO

## 2022-11-28 ENCOUNTER — Ambulatory Visit (INDEPENDENT_AMBULATORY_CARE_PROVIDER_SITE_OTHER): Payer: Commercial Managed Care - PPO | Admitting: Family Medicine

## 2022-11-28 ENCOUNTER — Encounter: Payer: Self-pay | Admitting: Family Medicine

## 2022-11-28 VITALS — BP 122/78 | HR 85 | Temp 98.6°F | Ht 64.0 in | Wt 201.8 lb

## 2022-11-28 DIAGNOSIS — E669 Obesity, unspecified: Secondary | ICD-10-CM | POA: Diagnosis not present

## 2022-11-28 DIAGNOSIS — E1169 Type 2 diabetes mellitus with other specified complication: Secondary | ICD-10-CM

## 2022-11-28 NOTE — Progress Notes (Signed)
Subjective:  Patient ID: Dawn Downs, female    DOB: [birth year]  Age: 46 y.o. MRN: [id number]  CC:  Chief Complaint  Patient presents with   Insurance Forms     Pt needs insurance forms filled out     HPI Dawn Downs presents for   Form completion.  Has paperwork for lab form and screening vital signs.. Labs ordered by PCP Dr. Beverely Low. Form needs to be returned today. Prior labs are ok to use.  Next visit with Dr. Beverely Low 12/20/22   History Patient Active Problem List   Diagnosis Date Noted   Diabetes mellitus type 2 in obese (HCC) 11/14/2022   Hypertension, essential 11/14/2022   Low serum HDL 11/14/2022   Class 1 obesity without serious comorbidity with body mass index (BMI) of 34.0 to 34.9 in adult 11/14/2022   Class 3 severe obesity with serious comorbidity and body mass index (BMI) of 40.0 to 44.9 in adult (HCC) 10/28/2022   Diabetes mellitus (HCC) 10/03/2022   NAFLD (nonalcoholic fatty liver disease) 40/98/1191   Vitamin D deficiency 03/04/2022   Depression 12/21/2020   Physical exam 12/14/2019   Hyperlipidemia associated with type 2 diabetes mellitus (HCC) 12/15/2017   Diabetes mellitus without complication (HCC) 11/14/2017   Hypertension associated with diabetes (HCC) 11/14/2017   Morbidly obese (HCC) 11/14/2017   PCOS (polycystic ovarian syndrome) 05/11/2015   Endometriosis 11/08/2008   Past Medical History:  Diagnosis Date   Anxiety    Bilateral swelling of feet and ankles    Depression    Diabetes mellitus without complication (HCC)    Gallbladder problem    GERD (gastroesophageal reflux disease)    Hyperlipidemia    Hypertension    Other fatigue    Pancreatitis due to common bile duct stone    PCOS (polycystic ovarian syndrome)    Shortness of breath on exertion    UTI (urinary tract infection)    Past Surgical History:  Procedure Laterality Date   CHOLECYSTECTOMY     OVARIAN CYST REMOVAL     No Known Allergies Prior to Admission  medications   Medication Sig Start Date End Date Taking? Authorizing Provider  buPROPion (WELLBUTRIN XL) 300 MG 24 hr tablet TAKE 1 TABLET BY MOUTH EVERY DAY 03/15/22  Yes Sheliah Hatch, MD  levonorgestrel (MIRENA, 52 MG,) 20 MCG/DAY IUD Mirena 21 mcg/24 hours (8 yrs) 52 mg intrauterine device  Device supplied by care center.   Yes [provider]  Magnesium Chloride POWD 325 mg q hs 10/24/21  Yes Opalski, Deborah, DO  metFORMIN (GLUCOPHAGE-XR) 500 MG 24 hr tablet TAKE 1 TABLET BY MOUTH TWICE A DAY 08/01/22  Yes Sheliah Hatch, MD  rosuvastatin (CRESTOR) 10 MG tablet TAKE 1 TABLET BY MOUTH EVERY DAY 03/04/22  Yes Sheliah Hatch, MD  tirzepatide [facility]) 12.5 MG/0.5ML Pen Inject 12.5 mg into the skin once a week. Every thursday 10/24/22  Yes Thomasene Lot, DO   Social History   Socioeconomic History   Marital status: Single    Spouse name: Not on file   Number of children: Not on file   Years of education: Not on file   Highest education level: Not on file  Occupational History   Occupation: Employee benefits    Employer: [employer]  Tobacco Use   Smoking status: Never   Smokeless tobacco: Never  Vaping Use   Vaping Use: Never used  Substance and Sexual Activity   Alcohol use: Yes   Drug use:  No   Sexual activity: Never  Other Topics Concern   Not on file  Social History Narrative   Not on file   Social Determinants of Health   Financial Resource Strain: Not on file  Food Insecurity: Not on file  Transportation Needs: Not on file  Physical Activity: Not on file  Stress: Not on file  Social Connections: Not on file  Intimate Partner Violence: Not on file    Review of Systems   Objective:   Vitals:   11/28/22 1159  BP: 122/78  Pulse: 85  Temp: 98.6 F (37 C)  SpO2: 99%  Weight: 201 lb 12.8 oz (91.5 kg)  Height: 5\' 4"  (1.626 m)     Physical Exam Constitutional:      General: She is not in acute distress.    Appearance:  Normal appearance. She is well-developed.  HENT:     Head: Normocephalic and atraumatic.  Cardiovascular:     Rate and Rhythm: Normal rate.  Pulmonary:     Effort: Pulmonary effort is normal.  Neurological:     Mental Status: She is alert and oriented to person, place, and time.  Psychiatric:        Mood and Affect: Mood normal.        Assessment & Plan:  Dawn Downs is a 46 y.o. female . Type 2 diabetes mellitus with obesity (HCC) Insurance form completed with vital signs, weight circumference from today, previous labs entered within the past 6 months.  Has follow-up with Dr. 49 soon.  Deferred labs until closer to that visit.  No orders of the defined types were placed in this encounter.  There are no Patient Instructions on file for this visit.    Signed,   Beverely Low, MD [facility], [facility] 11/28/22 6:57 PM

## 2022-12-05 ENCOUNTER — Ambulatory Visit (INDEPENDENT_AMBULATORY_CARE_PROVIDER_SITE_OTHER): Payer: Commercial Managed Care - PPO | Admitting: Family Medicine

## 2022-12-05 ENCOUNTER — Encounter (INDEPENDENT_AMBULATORY_CARE_PROVIDER_SITE_OTHER): Payer: Self-pay | Admitting: Family Medicine

## 2022-12-05 VITALS — BP 124/84 | HR 88 | Temp 97.9°F | Ht 64.0 in | Wt 196.2 lb

## 2022-12-05 DIAGNOSIS — E669 Obesity, unspecified: Secondary | ICD-10-CM

## 2022-12-05 DIAGNOSIS — Z6833 Body mass index (BMI) 33.0-33.9, adult: Secondary | ICD-10-CM | POA: Diagnosis not present

## 2022-12-05 DIAGNOSIS — E1169 Type 2 diabetes mellitus with other specified complication: Secondary | ICD-10-CM | POA: Diagnosis not present

## 2022-12-05 DIAGNOSIS — Z7985 Long-term (current) use of injectable non-insulin antidiabetic drugs: Secondary | ICD-10-CM | POA: Diagnosis not present

## 2022-12-05 MED ORDER — TIRZEPATIDE 12.5 MG/0.5ML ~~LOC~~ SOAJ: 12.5000 mg | SUBCUTANEOUS | 0 refills | Status: DC

## 2022-12-10 NOTE — Progress Notes (Signed)
Chief Complaint:   OBESITY Dawn Downs is here to discuss her progress with her obesity treatment plan along with follow-up of her obesity related diagnoses. Dawn Downs is on keeping a food journal and adhering to recommended goals of 1750-1850 calories and 110+ protein and states she is following her eating plan approximately 100% of the time. Dawn Downs states she is exercising.  Today's visit was #: 21 Starting weight: 258 LBS Starting date: 09/20/2021 Today's weight: 196 LBS Today's date: 12/05/2022 Total lbs lost to date: 62 LBS Total lbs lost since last in-office visit: 2 LBS  Interim History: We increase Mounjaro to 12.5 mg in mid September, but now feels there is some increased cravings after a couple of weeks.  More candy treats at work and occasionally skips meals  Subjective:   1. Type 2 diabetes mellitus with other specified complication, without long-term current use of insulin (HCC) Last A1c was 5.5 on September 23, which is at goal.  Patient has not been eating her protein and has having more candy etc. and more snacks.  Assessment/Plan:  No orders of the defined types were placed in this encounter.   Medications Discontinued During This Encounter  Medication Reason   tirzepatide (MOUNJARO) 12.5 MG/0.5ML Pen Reorder     Meds ordered this encounter  Medications   DISCONTD: tirzepatide (MOUNJARO) 12.5 MG/0.5ML Pen    Sig: Inject 12.5 mg into the skin once a week. Every thursday    Dispense:  2 mL    Refill:  0     1. Type 2 diabetes mellitus with other specified complication, without long-term current use of insulin (HCC) Patient declines increase in dose of Mounjaro.  Increase protein, fiber, and water intake.  Refill- tirzepatide (MOUNJARO) 12.5 MG/0.5ML Pen; Inject 12.5 mg into the skin once a week. Every thursday  Dispense: 2 mL; Refill: 0  2. Obesity, current BMI 33.7 Dawn Downs is currently in the action stage of change. As such, her goal is to continue with  weight loss efforts. She has agreed to keeping a food journal and adhering to recommended goals of 1750-1850 calories and 110+ protein.   Exercise goals:  As is.  Behavioral modification strategies: increasing lean protein intake, decreasing simple carbohydrates, and holiday eating strategies .  Dawn Downs has agreed to follow-up with our clinic in 3-4 weeks. She was informed of the importance of frequent follow-up visits to maximize her success with intensive lifestyle modifications for her multiple health conditions.   Objective:   Blood pressure 124/84, pulse 88, temperature 97.9 F (36.6 C), height 5\' 4"  (1.626 m), weight 196 lb 3.2 oz (89 kg), SpO2 98 %. Body mass index is 33.68 kg/m.  General: Cooperative, alert, well developed, in no acute distress. HEENT: Conjunctivae and lids unremarkable. Cardiovascular: Regular rhythm.  Lungs: Normal work of breathing. Neurologic: No focal deficits.   Lab Results  Component Value Date   CREATININE 0.67 09/09/2022   BUN 16 09/09/2022   NA 138 09/09/2022   K 3.9 09/09/2022   CL 103 09/09/2022   CO2 26 09/09/2022   Lab Results  Component Value Date   ALT 16 06/06/2022   AST 13 06/06/2022   ALKPHOS 64 06/06/2022   BILITOT 0.5 06/06/2022   Lab Results  Component Value Date   HGBA1C 5.5 09/09/2022   HGBA1C 6.2 06/06/2022   HGBA1C 7.0 (H) 03/04/2022   HGBA1C 6.8 (H) 12/03/2021   HGBA1C 10.3 (H) 08/31/2021   Lab Results  Component Value Date   INSULIN  27.9 (H) 09/20/2021   Lab Results  Component Value Date   TSH 1.44 06/06/2022   Lab Results  Component Value Date   CHOL 123 06/06/2022   HDL 33.70 (L) 06/06/2022   LDLCALC 74 06/06/2022   TRIG 76.0 06/06/2022   CHOLHDL 4 06/06/2022   Lab Results  Component Value Date   VD25OH CANCELED 10/03/2022   VD25OH 101.01 (HH) 06/06/2022   VD25OH 36.10 03/04/2022   Lab Results  Component Value Date   WBC 10.4 06/06/2022   HGB 12.9 06/06/2022   HCT 38.7 06/06/2022   MCV 74.7  (L) 06/06/2022   PLT 360.0 06/06/2022   No results found for: "IRON", "TIBC", "FERRITIN"  Attestation Statements:   Reviewed by clinician on day of visit: allergies, medications, problem list, medical history, surgical history, family history, social history, and previous encounter notes.  I, Malcolm Metro, RMA, am acting as Energy manager for [employer], DO.   I have reviewed the above documentation for accuracy and completeness, and I agree with the above. [name], D.O.  The 21st Century Cures Act was signed into law in 2016 which includes the topic of electronic health records.  This provides immediate access to information in MyChart.  This includes consultation notes, operative notes, office notes, lab results and pathology reports.  If you have any questions about what you read please let us know at your next visit so we can discuss your concerns and take corrective action if need be.  We are right here with you.

## 2022-12-14 ENCOUNTER — Telehealth: Payer: Self-pay | Admitting: Family Medicine

## 2022-12-14 ENCOUNTER — Other Ambulatory Visit: Payer: Self-pay | Admitting: Family Medicine

## 2022-12-14 MED ORDER — NITROFURANTOIN MONOHYD MACRO 100 MG PO CAPS: 100.0000 mg | ORAL_CAPSULE | Freq: Two times a day (BID) | ORAL | 0 refills | Status: DC

## 2022-12-14 NOTE — Telephone Encounter (Signed)
[facility] called to report patient called with symptoms of dysuria, urinary frequency urinary urgency and subjective fever. She is working all weekend and is hoping a medication Dawn Downs be prescribed.  She is encouraged to increase hydration, add AZO cranberry tabs and Nitrofurantoin 100 mg tabs are prescribed bid x 3 days and she is to call the office first of the week if her symptoms are not improving.

## 2022-12-15 ENCOUNTER — Other Ambulatory Visit: Payer: Self-pay | Admitting: Family Medicine

## 2022-12-15 DIAGNOSIS — E1169 Type 2 diabetes mellitus with other specified complication: Secondary | ICD-10-CM

## 2022-12-16 ENCOUNTER — Telehealth: Payer: Self-pay | Admitting: Family Medicine

## 2022-12-16 NOTE — Telephone Encounter (Signed)
Initial Comment Caller states she has a UTI, very painful. Translation No Nurse Assessment Nurse: Lucretia Roers, RN, Fuller Song Date/Time (Eastern Time): 12/14/2022 9:02:52 PM Confirm and document reason for call. If symptomatic, describe symptoms. ---Caller states she has a UTI, very painful all the time. Having urgency, low grade fever. Does the patient have any new or worsening symptoms? ---Yes Will a triage be completed? ---Yes Related visit to physician within the last 2 weeks? ---No Does the PT have any chronic conditions? (i.e. diabetes, asthma, this includes High risk factors for pregnancy, etc.) ---Yes List chronic conditions. ---diabetes Is the patient pregnant or possibly pregnant? (Ask all females between the ages of 57-55) ---No Is this a behavioral health or substance abuse call? ---No Guidelines Guideline Title Affirmed Question Affirmed Notes Nurse Date/Time Lamount Cohen Time) Urination Pain - Female Diabetes mellitus or weak immune system (e.g., HIV positive, cancer chemo, splenectomy, organ transplant, chronic steroids) Lucretia Roers, RN, Fuller Song 12/14/2022 9:04:45 PM PLEASE NOTE: All timestamps contained within this report are represented as [place] Standard Time. CONFIDENTIALTY NOTICE: This fax transmission is intended only for the addressee. It contains information that is legally privileged, confidential or otherwise protected from use or disclosure. If you are not the intended recipient, you are strictly prohibited from reviewing, disclosing, copying using or disseminating any of this information or taking any action in reliance on or regarding this information. If you have received this fax in error, please notify us immediately by telephone so that we can arrange for its return to [place]. Phone: [phone], Toll-Free: [phone], Fax: [phone] Page: 2 of 3 Call Id: [id number] Disp. Time Lamount Cohen Time) Disposition Final User 12/14/2022 9:00:29 PM Send To RN Personal  Benna Dunks, RN, Santina Evans 12/14/2022 9:07:48 PM See HCP within 4 Hours (or PCP triage) Yes Lucretia Roers, RN, [facility] 12/14/2022 9:11:51 PM Paged On Call back to [facility], RN, [facility] 12/14/2022 9:44:21 PM Paged On Call back to [facility], RN, Fuller Song 12/14/2022 9:58:25 PM Called On-Call Provider Lucretia Roers, RN, Fuller Song Final Disposition 12/14/2022 9:07:48 PM See HCP within 4 Hours (or PCP triage) Yes Lucretia Roers, RN, Fuller Song Caller Disagree/Comply Comply Caller Understands Yes PreDisposition InappropriateToAsk Care Advice Given Per Guideline SEE HCP (OR PCP TRIAGE) WITHIN 4 HOURS: * IF OFFICE WILL BE OPEN: You need to be seen within the next 3 or 4 hours. Call your doctor (or NP/PA) now or as soon as the office opens. CALL BACK IF: * You become worse CARE ADVICE given per Urination Pain - Female (Adult) guideline. Comments User: Mikal Plane, RN Date/Time Lamount Cohen Time): 12/14/2022 9:07:41 PM [facility] [address] User: Mikal Plane, RN Date/Time Lamount Cohen Time): 12/14/2022 9:24:38 PM Informed caller that OCP Stated that she will send in Macrobid 100mg  PO BID x3days. SHe wants the patient to take AZO cranberry tablets alongside the abx and drink plenty of fluids. User: , RN Date/Time Mikal Plane Time): 12/14/2022 9:43:29 PM Caller stated that [facility] [address] [facility] that is still open. User: 12/16/2022, RN Date/Time Mikal Plane Time): 12/14/2022 10:00:33 PM Informed caller that the OCP resent the script to [facility] [address]. Referrals GO TO FACILITY UNDECIDED PLEASE NOTE: All timestamps contained within this report are represented as 12/16/2022 Standard Time. CONFIDENTIALTY NOTICE: This fax transmission is intended only for the addressee. It contains information that is legally privileged, confidential or otherwise protected from use or disclosure. If you are not the intended recipient, you are strictly prohibited from reviewing, disclosing, copying using or  disseminating any of this information or taking  any action in reliance on or regarding this information. If you have received this fax in error, please notify us immediately by telephone so that we can arrange for its return to [place]. Phone: [phone], Toll-Free: [phone], Fax: [phone] Page: 3 of 3 Call Id: [id number] Paging DoctorName Phone DateTime Result/ Outcome Message Type Notes Danise Edge - MD [phone] 12/14/2022 9:11:51 PM Paged On Call Back to Call Center Doctor Paged Please call Fuller Song, RN, at Access Nurse [phone] Danise Edge - MD 12/14/2022 9:24:04 PM Spoke with On Call - General Message Result Gave report. OCP accessed patient's record for allergies, etc. Stated that she will send in Macrobid 100mg  PO BID x3days. SHe wants the patient to take AZO cranberry tablets alongside the abx and drink plenty of fluids. - MD Danise Edge 12/14/2022 9:44:21 PM Paged On Call Back to Call Center Doctor Paged Please call 12/16/2022, RN, Access Nurse at [phone] - MD Danise Edge 12/14/2022 9:58:25 PM Called On Call Provider - Reached Doctor Paged 12/16/2022 - MD 12/14/2022 9:59:09 PM Spoke with On Call - General Message Result Informed Dr. 12/16/2022 that the caller's prefered pharmacy was closed. And that caller wanted  Called patient and no answer. LM for patient call back to the office if she has any questions or concerns.

## 2022-12-16 NOTE — Telephone Encounter (Signed)
Can pt be placed on the nurse schedule/lab schedule for UA and culture?  She also needs to send me a MyChart message w/ her sxs so I have documentation for the labs being done.  That way I can treat her ASAP w/o her waiting until 12/22

## 2022-12-16 NOTE — Telephone Encounter (Signed)
Pt has appt 12/20/22 as soonest but note states be seen within 3-4 hours? Please advise if other actions should be taken

## 2022-12-16 NOTE — Telephone Encounter (Signed)
I have the pt a VM to call the office and make a nurse visit apt and to send Dr Beverely Low a my chart message stating her symptoms so we have documentation as to why we are ordering the urine

## 2022-12-16 NOTE — Telephone Encounter (Signed)
FYI Called and spoke to the patient and notes she is actually doing quite a bit better than Friday, I did offer the follow up urine she states she would like to see how she does next 24 hours and then can get back with [place] about the urine sample patient states she may just wait till the follow up appt.

## 2022-12-17 ENCOUNTER — Ambulatory Visit (INDEPENDENT_AMBULATORY_CARE_PROVIDER_SITE_OTHER): Payer: Commercial Managed Care - PPO

## 2022-12-17 DIAGNOSIS — R300 Dysuria: Secondary | ICD-10-CM

## 2022-12-17 NOTE — Progress Notes (Signed)
Patient presented today for continued burning and other discomfort with urination AZO is present at this time dipstick could not be read will be sending culture at this time appt is tomorrow for sxs

## 2022-12-18 LAB — URINE CULTURE
MICRO NUMBER:: 14335100
SPECIMEN QUALITY:: ADEQUATE

## 2022-12-19 ENCOUNTER — Telehealth: Payer: Self-pay

## 2022-12-19 NOTE — Telephone Encounter (Signed)
-----   Message from Sheliah Hatch, MD sent at 12/19/2022  7:36 AM EST ----- Urine does not show infection- this is great news!

## 2022-12-19 NOTE — Telephone Encounter (Signed)
Informed pt of lab results  

## 2022-12-20 ENCOUNTER — Other Ambulatory Visit: Payer: Self-pay | Admitting: Family Medicine

## 2022-12-20 ENCOUNTER — Encounter: Payer: Self-pay | Admitting: Family Medicine

## 2022-12-20 ENCOUNTER — Ambulatory Visit (INDEPENDENT_AMBULATORY_CARE_PROVIDER_SITE_OTHER): Payer: Commercial Managed Care - PPO | Admitting: Family Medicine

## 2022-12-20 VITALS — BP 120/70 | HR 81 | Temp 98.1°F | Resp 17 | Ht 64.0 in | Wt 201.2 lb

## 2022-12-20 DIAGNOSIS — E6609 Other obesity due to excess calories: Secondary | ICD-10-CM | POA: Diagnosis not present

## 2022-12-20 DIAGNOSIS — E669 Obesity, unspecified: Secondary | ICD-10-CM | POA: Diagnosis not present

## 2022-12-20 DIAGNOSIS — Z6834 Body mass index (BMI) 34.0-34.9, adult: Secondary | ICD-10-CM | POA: Diagnosis not present

## 2022-12-20 DIAGNOSIS — E1169 Type 2 diabetes mellitus with other specified complication: Secondary | ICD-10-CM | POA: Diagnosis not present

## 2022-12-20 DIAGNOSIS — E785 Hyperlipidemia, unspecified: Secondary | ICD-10-CM | POA: Diagnosis not present

## 2022-12-20 LAB — BASIC METABOLIC PANEL
BUN: 18 mg/dL (ref 6–23)
CO2: 26 mEq/L (ref 19–32)
Calcium: 9.4 mg/dL (ref 8.4–10.5)
Chloride: 105 mEq/L (ref 96–112)
Creatinine, Ser: 0.54 mg/dL (ref 0.40–1.20)
GFR: 110.15 mL/min (ref >60.00)
Glucose, Bld: 80 mg/dL (ref 70–99)
Potassium: 3.9 mEq/L (ref 3.5–5.1)
Sodium: 138 mEq/L (ref 135–145)

## 2022-12-20 LAB — CBC WITH DIFFERENTIAL/PLATELET
Basophils Absolute: 0.1 10*3/uL (ref 0.0–0.1)
Basophils Relative: 0.6 % (ref 0.0–3.0)
Eosinophils Absolute: 0.3 10*3/uL (ref 0.0–0.7)
Eosinophils Relative: 3 % (ref 0.0–5.0)
HCT: 37.8 % (ref 36.0–46.0)
Hemoglobin: 12.6 g/dL (ref 12.0–15.0)
Lymphocytes Relative: 17 % (ref 12.0–46.0)
Lymphs Abs: 1.8 10*3/uL (ref 0.7–4.0)
MCHC: 33.4 g/dL (ref 30.0–36.0)
MCV: 77.1 fl — ABNORMAL LOW (ref 78.0–100.0)
Monocytes Absolute: 0.7 10*3/uL (ref 0.1–1.0)
Monocytes Relative: 6 % (ref 3.0–12.0)
Neutro Abs: 8 10*3/uL — ABNORMAL HIGH (ref 1.4–7.7)
Neutrophils Relative %: 73.4 % (ref 43.0–77.0)
Platelets: 353 10*3/uL (ref 150.0–400.0)
RBC: 4.9 Mil/uL (ref 3.87–5.11)
RDW: 15.9 % — ABNORMAL HIGH (ref 11.5–15.5)
WBC: 10.9 10*3/uL — ABNORMAL HIGH (ref 4.0–10.5)

## 2022-12-20 LAB — LIPID PANEL
Cholesterol: 173 mg/dL (ref 0–200)
HDL: 33.2 mg/dL — ABNORMAL LOW (ref >39.00)
LDL Cholesterol: 123 mg/dL — ABNORMAL HIGH (ref 0–99)
NonHDL: 139.7
Total CHOL/HDL Ratio: 5
Triglycerides: 83 mg/dL (ref 0.0–149.0)
VLDL: 16.6 mg/dL (ref 0.0–40.0)

## 2022-12-20 LAB — HEPATIC FUNCTION PANEL
ALT: 12 U/L (ref 0–35)
AST: 14 U/L (ref 0–37)
Albumin: 4.5 g/dL (ref 3.5–5.2)
Alkaline Phosphatase: 53 U/L (ref 39–117)
Bilirubin, Direct: 0 mg/dL (ref 0.0–0.3)
Total Bilirubin: 0.3 mg/dL (ref 0.2–1.2)
Total Protein: 7 g/dL (ref 6.0–8.3)

## 2022-12-20 LAB — HEMOGLOBIN A1C: Hgb A1c MFr Bld: 5.1 % (ref 4.6–6.5)

## 2022-12-20 NOTE — Assessment & Plan Note (Signed)
Chronic problem.  Tolerating Crestor 10mg daily w/o difficulty.  Check labs.  Adjust meds prn  

## 2022-12-20 NOTE — Patient Instructions (Signed)
Follow up in 3-4 months to recheck sugar We'll notify you of your lab results and make any changes if needed Continue to work on healthy diet and regular exercise- you can do it! Call and schedule your eye exam and have them send me a copy of their report Call with any questions or concerns Stay Safe!  Stay Healthy! Happy Holidays!!

## 2022-12-20 NOTE — Progress Notes (Signed)
   Subjective:    Patient ID: Dawn Downs, female    DOB: March 11, 1976, 46 y.o.   MRN: [id number]  HPI DM- chronic problem, on Metformin XR 500mg  BID, Mounjaro 12.5mg  weekly.  Due for microalbumin, foot exam, eye exam.  No CP, SOB, HA's, visual changes.  Denies symptomatic lows.  No numbness/tingling of hands/feet.  Hyperlipidemia- chronic problem, on Crestor 10mg  daily.  Denies abd pain, N/V.  + constipation w/ Mounjaro  Obesity- BMI 34.54.  currently on Mounjaro for both diabetes and weight loss.  Sporadic exercise due to work schedule.  Review of Systems For ROS see HPI     Objective:   Physical Exam Vitals reviewed.  Constitutional:      General: She is not in acute distress.    Appearance: Normal appearance. She is well-developed. She is not ill-appearing.  HENT:     Head: Normocephalic and atraumatic.  Eyes:     Conjunctiva/sclera: Conjunctivae normal.     Pupils: Pupils are equal, round, and reactive to light.  Neck:     Thyroid: No thyromegaly.  Cardiovascular:     Rate and Rhythm: Normal rate and regular rhythm.     Pulses: Normal pulses.     Heart sounds: Normal heart sounds. No murmur heard. Pulmonary:     Effort: Pulmonary effort is normal. No respiratory distress.     Breath sounds: Normal breath sounds.  Abdominal:     General: There is no distension.     Palpations: Abdomen is soft.     Tenderness: There is no abdominal tenderness.  Musculoskeletal:     Cervical back: Normal range of motion and neck supple.     Right lower leg: No edema.     Left lower leg: No edema.  Lymphadenopathy:     Cervical: No cervical adenopathy.  Skin:    General: Skin is warm and dry.  Neurological:     General: No focal deficit present.     Mental Status: She is alert and oriented to person, place, and time.  Psychiatric:        Mood and Affect: Mood normal.        Behavior: Behavior normal.           Assessment & Plan:

## 2022-12-20 NOTE — Assessment & Plan Note (Signed)
Chronic problem.  Currently on Metformin XR 500mg  Bid and Mounjaro 12.5mg  weekly.  Due for eye exam- pt to schedule.  Microalbumin ordered.  Foot exam done.  Check labs.  Adjust meds prn

## 2022-12-20 NOTE — Assessment & Plan Note (Signed)
Chronic problem.  BMI 34.54.  Currently on Mounjaro and following w/ healthy weight and wellness.  Encouraged low carb diet and regular exercise.  Will continue to follow.

## 2022-12-21 LAB — TSH: TSH: 1.77 u[IU]/mL (ref 0.35–5.50)

## 2022-12-24 ENCOUNTER — Other Ambulatory Visit: Payer: Self-pay

## 2022-12-24 ENCOUNTER — Telehealth: Payer: Self-pay

## 2022-12-24 DIAGNOSIS — R7989 Other specified abnormal findings of blood chemistry: Secondary | ICD-10-CM

## 2022-12-24 LAB — VITAMIN D 25 HYDROXY (VIT D DEFICIENCY, FRACTURES): VITD: 109.84 ng/mL (ref 30.00–100.00)

## 2022-12-24 NOTE — Telephone Encounter (Signed)
Looking back I see that it was also elevated 6 months ago.  I understand if she is not currently taking vitamin D, but has she been on the prescription strength recently?  Make sure no multivitamins have vitamin D in them or any supplements with vitamin D and recheck levels in 1 week.  If any new symptoms should be seen.

## 2022-12-24 NOTE — Telephone Encounter (Signed)
Dawn Downs from the lab called and states the pt Vit D level is 109.4 . Pt is a Dr Beverely Low. Pt is not currently taking Vit D

## 2022-12-24 NOTE — Telephone Encounter (Signed)
I have left the pt a VM askin her to return my call Vit d lab repeat order has been placed

## 2022-12-25 NOTE — Telephone Encounter (Signed)
I spoke to the pt and advised of the lab results . She states she is not taking any supplements . Vit D order has been placed and she will call to schedule her lab only visit for one week .

## 2022-12-26 ENCOUNTER — Ambulatory Visit (INDEPENDENT_AMBULATORY_CARE_PROVIDER_SITE_OTHER): Payer: Commercial Managed Care - PPO | Admitting: Physician Assistant

## 2022-12-26 ENCOUNTER — Encounter (INDEPENDENT_AMBULATORY_CARE_PROVIDER_SITE_OTHER): Payer: Self-pay | Admitting: Physician Assistant

## 2022-12-26 VITALS — BP 116/82 | HR 94 | Temp 98.1°F | Ht 64.0 in | Wt 199.0 lb

## 2022-12-26 DIAGNOSIS — R7989 Other specified abnormal findings of blood chemistry: Secondary | ICD-10-CM | POA: Diagnosis not present

## 2022-12-26 DIAGNOSIS — K5903 Drug induced constipation: Secondary | ICD-10-CM

## 2022-12-26 DIAGNOSIS — E785 Hyperlipidemia, unspecified: Secondary | ICD-10-CM

## 2022-12-26 DIAGNOSIS — E1169 Type 2 diabetes mellitus with other specified complication: Secondary | ICD-10-CM | POA: Diagnosis not present

## 2022-12-26 DIAGNOSIS — Z7984 Long term (current) use of oral hypoglycemic drugs: Secondary | ICD-10-CM

## 2022-12-26 DIAGNOSIS — Z7985 Long-term (current) use of injectable non-insulin antidiabetic drugs: Secondary | ICD-10-CM

## 2022-12-26 DIAGNOSIS — Z6834 Body mass index (BMI) 34.0-34.9, adult: Secondary | ICD-10-CM

## 2022-12-26 MED ORDER — TIRZEPATIDE 12.5 MG/0.5ML ~~LOC~~ SOAJ: 12.5000 mg | SUBCUTANEOUS | 0 refills | Status: DC

## 2023-01-02 ENCOUNTER — Ambulatory Visit (INDEPENDENT_AMBULATORY_CARE_PROVIDER_SITE_OTHER): Payer: Commercial Managed Care - PPO | Admitting: Family Medicine

## 2023-01-08 NOTE — Progress Notes (Signed)
Chief Complaint:   OBESITY Dawn Downs is here to discuss her progress with her obesity treatment plan along with follow-up of her obesity related diagnoses. Dawn Downs is on keeping a food journal and adhering to recommended goals of 1750-1850 calories and 110 grams of protein and states she is following her eating plan approximately 50% of the time. Dawn Downs states she is exercising 0 minutes 0 times per week.  Today's visit was #: 22 Starting weight: 258 lbs Starting date: 09/20/2021 Today's weight: 199 lbs Today's date: 12/26/2022 Total lbs lost to date: 59 lbs Total lbs lost since last in-office visit: 0  Interim History: Dawn Downs has done well overall with weight loss.  Currently she has a kidney stone/UTI which has not passed and reports she may need surgery to remove.   Seeing urology today to address .   She has not been able to focus much on her nutrition plan due to the above as well as over the holidays.  Plans to get back on track with her prescribed nutrition plan once kidney stone is addressed.  Subjective:   1. Type 2 diabetes mellitus with other specified complication, without long-term current use of insulin (HCC) A1c at 5.5 on 12/20/2022-at goal.  On Mounjaro 12.5 mg weekly-no side effects.  Taking Metformin 500 mg twice a day prior (prescribed by Dr. Raliegh Ip. Tabori)-no side effects with  metformin.  2. High serum vitamin D Vitamin D level of 109.84, vitamin D supplement discontinued about 3 months ago.  Still elevated-no symptoms from elevation such as nausea, vomiting, no muscle weakness.  3. Hyperlipidemia associated with type 2 diabetes mellitus ([place]) HDL of 33.20-not at goal/trig of 83-at goal/LDL of 123-not at goal on 12/20/2022.  Taking Crestor 10 mg daily.  No side effects with Crestor.  Working to decrease saturated fat and cholesterol, decrease simple carbohydrates and exercise to promote weight loss.  4. Drug-induced constipation Taking MiraLAX daily and having  results daily and reports regular bowel movements.  Assessment/Plan:   1. Type 2 diabetes mellitus with other specified complication, without long-term current use of insulin (HCC) Continue/Refill Mounjaro 12.5 subcu once weekly for 1 month with 0 refills. Continue healthy eating plan to decrease simple carbohydrates, increase lean protein and exercise to promote weight loss.  Dr. Birdie Riddle is prescribing the metformin.   -Refill tirzepatide (MOUNJARO) 12.5 MG/0.5ML Pen; Inject 12.5 mg into the skin once a week. Every thursday  Dispense: 2 mL; Refill: 0  2. High serum vitamin D She is seeing her PCP, Dr. Birdie Riddle next week and plans to have follow up labs done a this time.  monitor vitamin D level closely with PCP and discussed remaining off of all Vitamin D at this time.  3. Hyperlipidemia associated with type 2 diabetes mellitus ([place]) Continue taking Crestor 10 mg daily. Continue healthy eating plan to decrease saturated fats and cholesterol, decrease simple carbohydrates, increase lean protein and exercise to promote weight loss.   4. Drug-induced constipation Continue MiraLAX daily. Monitory closely. Maintain good hydration and increase fiber in diet.   5. Obesity, current BMI 34.2 Dawn Downs is currently in the action stage of change. As such, her goal is to continue with weight loss efforts. She has agreed to keeping a food journal and adhering to recommended goals of 1750-1850 calories and 110+ grams of protein daily.   Exercise goals: As is.  Behavioral modification strategies: increasing lean protein intake, decreasing simple carbohydrates, increasing water intake, and no skipping meals.  Dawn Downs has agreed  to follow-up with our clinic in 3 weeks. She was informed of the importance of frequent follow-up visits to maximize her success with intensive lifestyle modifications for her multiple health conditions.   Objective:   Blood pressure 116/82, pulse 94, temperature 98.1 F (36.7 C),  height 5\' 4"  (1.626 m), weight 199 lb (90.3 kg), SpO2 99 %. Body mass index is 34.16 kg/m.  General: Cooperative, alert, well developed, in no acute distress. HEENT: Conjunctivae and lids unremarkable. Cardiovascular: Regular rhythm.  Lungs: Normal work of breathing. Neurologic: No focal deficits.   Lab Results  Component Value Date   CREATININE 0.54 12/20/2022   BUN 18 12/20/2022   NA 138 12/20/2022   K 3.9 12/20/2022   CL 105 12/20/2022   CO2 26 12/20/2022   Lab Results  Component Value Date   ALT 12 12/20/2022   AST 14 12/20/2022   ALKPHOS 53 12/20/2022   BILITOT 0.3 12/20/2022   Lab Results  Component Value Date   HGBA1C 5.1 12/20/2022   HGBA1C 5.5 09/09/2022   HGBA1C 6.2 06/06/2022   HGBA1C 7.0 (H) 03/04/2022   HGBA1C 6.8 (H) 12/03/2021   Lab Results  Component Value Date   INSULIN 27.9 (H) 09/20/2021   Lab Results  Component Value Date   TSH 1.77 12/20/2022   Lab Results  Component Value Date   CHOL 173 12/20/2022   HDL 33.20 (L) 12/20/2022   LDLCALC 123 (H) 12/20/2022   TRIG 83.0 12/20/2022   CHOLHDL 5 12/20/2022   Lab Results  Component Value Date   VD25OH 109.84 ([employer]) 12/20/2022   VD25OH CANCELED 10/03/2022   VD25OH 101.01 (HH) 06/06/2022   Lab Results  Component Value Date   WBC 10.9 (H) 12/20/2022   HGB 12.6 12/20/2022   HCT 37.8 12/20/2022   MCV 77.1 (L) 12/20/2022   PLT 353.0 12/20/2022   No results found for: "IRON", "TIBC", "FERRITIN"  Attestation Statements:   Reviewed by clinician on day of visit: allergies, medications, problem list, medical history, surgical history, family history, social history, and previous encounter notes.  I, Brendell Tyus, am acting as transcriptionist for [employer], PA.  I have reviewed the above documentation for accuracy and completeness, and I agree with the above. -  Ilario Dhaliwal,PA-C

## 2023-01-23 ENCOUNTER — Ambulatory Visit (INDEPENDENT_AMBULATORY_CARE_PROVIDER_SITE_OTHER): Payer: Commercial Managed Care - PPO | Admitting: Family Medicine

## 2023-01-23 ENCOUNTER — Encounter (INDEPENDENT_AMBULATORY_CARE_PROVIDER_SITE_OTHER): Payer: Self-pay | Admitting: Family Medicine

## 2023-01-23 VITALS — BP 134/77 | HR 80 | Temp 98.0°F | Ht 64.0 in | Wt 192.6 lb

## 2023-01-23 DIAGNOSIS — E559 Vitamin D deficiency, unspecified: Secondary | ICD-10-CM | POA: Diagnosis not present

## 2023-01-23 DIAGNOSIS — E119 Type 2 diabetes mellitus without complications: Secondary | ICD-10-CM | POA: Insufficient documentation

## 2023-01-23 DIAGNOSIS — Z7985 Long-term (current) use of injectable non-insulin antidiabetic drugs: Secondary | ICD-10-CM

## 2023-01-23 DIAGNOSIS — Z6841 Body Mass Index (BMI) 40.0 and over, adult: Secondary | ICD-10-CM | POA: Insufficient documentation

## 2023-01-23 DIAGNOSIS — E1169 Type 2 diabetes mellitus with other specified complication: Secondary | ICD-10-CM | POA: Diagnosis not present

## 2023-01-23 DIAGNOSIS — R7989 Other specified abnormal findings of blood chemistry: Secondary | ICD-10-CM | POA: Insufficient documentation

## 2023-01-23 DIAGNOSIS — N200 Calculus of kidney: Secondary | ICD-10-CM | POA: Diagnosis not present

## 2023-01-23 DIAGNOSIS — Z6833 Body mass index (BMI) 33.0-33.9, adult: Secondary | ICD-10-CM

## 2023-01-23 DIAGNOSIS — E669 Obesity, unspecified: Secondary | ICD-10-CM

## 2023-01-23 MED ORDER — TIRZEPATIDE 12.5 MG/0.5ML ~~LOC~~ SOAJ: 12.5000 mg | SUBCUTANEOUS | 0 refills | Status: DC

## 2023-01-24 LAB — COMPREHENSIVE METABOLIC PANEL
ALT: 9 IU/L (ref 0–32)
AST: 10 IU/L (ref 0–40)
Albumin/Globulin Ratio: 2.2 (ref 1.2–2.2)
Albumin: 4.7 g/dL (ref 3.9–4.9)
Alkaline Phosphatase: 68 IU/L (ref 44–121)
BUN/Creatinine Ratio: 18 (ref 9–23)
BUN: 12 mg/dL (ref 6–24)
Bilirubin Total: 0.3 mg/dL (ref 0.0–1.2)
CO2: 20 mmol/L (ref 20–29)
Calcium: 9.5 mg/dL (ref 8.7–10.2)
Chloride: 103 mmol/L (ref 96–106)
Creatinine, Ser: 0.66 mg/dL (ref 0.57–1.00)
Globulin, Total: 2.1 g/dL (ref 1.5–4.5)
Glucose: 81 mg/dL (ref 70–99)
Potassium: 4.4 mmol/L (ref 3.5–5.2)
Sodium: 142 mmol/L (ref 134–144)
Total Protein: 6.8 g/dL (ref 6.0–8.5)
eGFR: 109 mL/min/{1.73_m2} (ref >59)

## 2023-01-24 LAB — VITAMIN D 25 HYDROXY (VIT D DEFICIENCY, FRACTURES): Vit D, 25-Hydroxy: 91.2 ng/mL (ref 30.0–100.0)

## 2023-01-24 LAB — CALCIUM, IONIZED: Calcium, Ion: 4.9 mg/dL (ref 4.5–5.6)

## 2023-02-10 NOTE — Progress Notes (Unsigned)
Chief Complaint:   OBESITY [name] is here to discuss her progress with her obesity treatment plan along with follow-up of her obesity related diagnoses. Dawn Downs is on keeping a food journal and adhering to recommended goals of 1750-1850 calories and 110+ protein and states she is following her eating plan approximately 70% of the time. [name] states she is not exercising.   Today's visit was #: 23 Starting weight: 258 lbs Starting date: 09/20/2021 Today's weight: 192 lbs Today's date: 01/23/2023 Total lbs lost to date: 66 lbs Total lbs lost since last in-office visit: 7 lbs  Interim History: Dawn Downs is here for a follow up office visit.  We reviewed her meal plan and all questions were answered.  Patient's food recall appears to be accurate and consistent with what is on plan when she is following it.   When eating on plan, her hunger and cravings are well controlled.    Subjective:   1. Nephrolithiasis Patient sees urology.  Recently seen on December 22 and had an x-ray showing.  Patient without Flomax, hydration, pain medication.  He has had a CT scan ultimately between Christmas and New Year's.  2. Type 2 diabetes mellitus with obesity ([place]) Patient denies low or high blood sugars.  Patient has not seen endocrinology doctor in many years.  3. Vitamin D deficiency Patient's last vitamin D level on 06/06/2022 was 101.  Patient stopped all vitamin D supplements.  Recently went up to 109.  Assessment/Plan:   Orders Placed This Encounter  Procedures   VITAMIN D 25 Hydroxy (Vit-D Deficiency, Fractures)   Calcium, ionized   Comprehensive metabolic panel   Calcium, ionized    Medications Discontinued During This Encounter  Medication Reason   tirzepatide (MOUNJARO) 12.5 MG/0.5ML Pen Reorder     Meds ordered this encounter  Medications   tirzepatide (MOUNJARO) 12.5 MG/0.5ML Pen    Sig: Inject 12.5 mg into the skin once a week. Every thursday    Dispense:  2  mL    Refill:  0     1. Nephrolithiasis Increase water intake, decrease calcium rich foods.  Check labs today.  Will also send results to PCP for their review Serum  - VITAMIN D 25 Hydroxy (Vit-D Deficiency, Fractures) - Calcium, ionized - Comprehensive metabolic panel - Calcium, ionized  2. Type 2 diabetes mellitus with obesity (HCC) Good blood sugar control is important to decrease the likelihood of diabetic complications such as nephropathy, neuropathy, limb loss, blindness, coronary artery disease, and death. Intensive lifestyle modification including diet, exercise and weight loss are the first line of treatment for diabetes.   Refill- tirzepatide (MOUNJARO) 12.5 MG/0.5ML Pen; Inject 12.5 mg into the skin once a week. Every thursday  Dispense: 2 mL; Refill: 0  Check labs today. - Comprehensive metabolic panel - Calcium, ionized  3. Vitamin D deficiency Recheck vitamin D level today.  Follow-up with PCP, regarding the elevated vitamin D level and symptomatic, likely calcium oxalate stone development recently.  - VITAMIN D 25 Hydroxy (Vit-D Deficiency, Fractures) - Calcium, ionized  4. Obesity, current BMI 33.1 Follow-up with urology as scheduled in February.  Follow-up with PCP regarding increased vitamin D despite no supplements for the last 8 months. (Patient does tan in a booth once weekly).   Dawn Downs is currently in the action stage of change. As such, her goal is to continue with weight loss efforts. She has agreed to keeping a food journal and adhering to recommended goals of 1750-1850  calories and 110+ protein.   Exercise goals: All adults should avoid inactivity. Some physical activity is better than none, and adults who participate in any amount of physical activity gain some health benefits.  Behavioral modification strategies: increasing lean protein intake, increasing water intake, and planning for success.  Dawn Downs has agreed to follow-up with our clinic in 3  weeks. She was informed of the importance of frequent follow-up visits to maximize her success with intensive lifestyle modifications for her multiple health conditions.   Objective:   Blood pressure 134/77, pulse 80, temperature 98 F (36.7 C), height 5' 4"$  (1.626 m), weight 192 lb 9.6 oz (87.4 kg), SpO2 100 %. Body mass index is 33.06 kg/m.  General: Cooperative, alert, well developed, in no acute distress. HEENT: Conjunctivae and lids unremarkable. Cardiovascular: Regular rhythm.  Lungs: Normal work of breathing. Neurologic: No focal deficits.   Lab Results  Component Value Date   CREATININE 0.66 01/23/2023   BUN 12 01/23/2023   NA 142 01/23/2023   K 4.4 01/23/2023   CL 103 01/23/2023   CO2 20 01/23/2023   Lab Results  Component Value Date   ALT 9 01/23/2023   AST 10 01/23/2023   ALKPHOS 68 01/23/2023   BILITOT 0.3 01/23/2023   Lab Results  Component Value Date   HGBA1C 5.1 12/20/2022   HGBA1C 5.5 09/09/2022   HGBA1C 6.2 06/06/2022   HGBA1C 7.0 (H) 03/04/2022   HGBA1C 6.8 (H) 12/03/2021   Lab Results  Component Value Date   INSULIN 27.9 (H) 09/20/2021   Lab Results  Component Value Date   TSH 1.77 12/20/2022   Lab Results  Component Value Date   CHOL 173 12/20/2022   HDL 33.20 (L) 12/20/2022   LDLCALC 123 (H) 12/20/2022   TRIG 83.0 12/20/2022   CHOLHDL 5 12/20/2022   Lab Results  Component Value Date   VD25OH 91.2 01/23/2023   VD25OH 109.84 (HH) 12/20/2022   VD25OH CANCELED 10/03/2022   Lab Results  Component Value Date   WBC 10.9 (H) 12/20/2022   HGB 12.6 12/20/2022   HCT 37.8 12/20/2022   MCV 77.1 (L) 12/20/2022   PLT 353.0 12/20/2022   No results found for: "IRON", "TIBC", "FERRITIN"  Attestation Statements:   Reviewed by clinician on day of visit: allergies, medications, problem list, medical history, surgical history, family history, social history, and previous encounter notes.  I, Davy Pique, RMA, am acting as Location manager for  [employer], DO.  I have reviewed the above documentation for accuracy and completeness, and I agree with the above. Marjory Sneddon, D.O.  The [place] was signed into law in 2016 which includes the topic of electronic health records.  This provides immediate access to information in MyChart.  This includes consultation notes, operative notes, office notes, lab results and pathology reports.  If you have any questions about what you read please let us know at your next visit so we can discuss your concerns and take corrective action if need be.  We are right here with you.

## 2023-02-12 ENCOUNTER — Encounter (INDEPENDENT_AMBULATORY_CARE_PROVIDER_SITE_OTHER): Payer: Self-pay | Admitting: Family Medicine

## 2023-02-12 ENCOUNTER — Ambulatory Visit (INDEPENDENT_AMBULATORY_CARE_PROVIDER_SITE_OTHER): Payer: Commercial Managed Care - PPO | Admitting: Family Medicine

## 2023-02-12 VITALS — BP 132/85 | HR 80 | Temp 97.7°F | Ht 64.0 in | Wt 196.2 lb

## 2023-02-12 DIAGNOSIS — Z7984 Long term (current) use of oral hypoglycemic drugs: Secondary | ICD-10-CM

## 2023-02-12 DIAGNOSIS — F39 Unspecified mood [affective] disorder: Secondary | ICD-10-CM | POA: Diagnosis not present

## 2023-02-12 DIAGNOSIS — E1169 Type 2 diabetes mellitus with other specified complication: Secondary | ICD-10-CM | POA: Diagnosis not present

## 2023-02-12 DIAGNOSIS — E559 Vitamin D deficiency, unspecified: Secondary | ICD-10-CM

## 2023-02-12 DIAGNOSIS — Z6833 Body mass index (BMI) 33.0-33.9, adult: Secondary | ICD-10-CM

## 2023-02-13 ENCOUNTER — Ambulatory Visit (INDEPENDENT_AMBULATORY_CARE_PROVIDER_SITE_OTHER): Payer: Commercial Managed Care - PPO | Admitting: Family Medicine

## 2023-02-24 ENCOUNTER — Other Ambulatory Visit (HOSPITAL_COMMUNITY): Payer: Self-pay

## 2023-02-24 ENCOUNTER — Encounter (INDEPENDENT_AMBULATORY_CARE_PROVIDER_SITE_OTHER): Payer: Self-pay | Admitting: Family Medicine

## 2023-02-25 ENCOUNTER — Telehealth (INDEPENDENT_AMBULATORY_CARE_PROVIDER_SITE_OTHER): Payer: Commercial Managed Care - PPO | Admitting: Family Medicine

## 2023-02-25 ENCOUNTER — Encounter (INDEPENDENT_AMBULATORY_CARE_PROVIDER_SITE_OTHER): Payer: Self-pay | Admitting: Family Medicine

## 2023-02-25 ENCOUNTER — Other Ambulatory Visit (HOSPITAL_COMMUNITY): Payer: Self-pay

## 2023-02-25 DIAGNOSIS — E1169 Type 2 diabetes mellitus with other specified complication: Secondary | ICD-10-CM

## 2023-02-25 DIAGNOSIS — Z6833 Body mass index (BMI) 33.0-33.9, adult: Secondary | ICD-10-CM | POA: Diagnosis not present

## 2023-02-25 DIAGNOSIS — Z7985 Long-term (current) use of injectable non-insulin antidiabetic drugs: Secondary | ICD-10-CM

## 2023-02-25 DIAGNOSIS — E669 Obesity, unspecified: Secondary | ICD-10-CM

## 2023-02-25 DIAGNOSIS — R7989 Other specified abnormal findings of blood chemistry: Secondary | ICD-10-CM

## 2023-02-25 DIAGNOSIS — Z7984 Long term (current) use of oral hypoglycemic drugs: Secondary | ICD-10-CM

## 2023-02-25 LAB — HM PAP SMEAR

## 2023-02-25 LAB — RESULTS CONSOLE HPV: CHL HPV: NEGATIVE

## 2023-02-25 MED ORDER — TIRZEPATIDE 7.5 MG/0.5ML ~~LOC~~ SOAJ
7.5000 mg | SUBCUTANEOUS | 0 refills | Status: DC
  Filled 2023-02-25: qty 2, 28d supply, fill #0

## 2023-02-25 NOTE — Progress Notes (Unsigned)
Chief Complaint:   OBESITY Dawn Downs is here to discuss her progress with her obesity treatment plan along with follow-up of her obesity related diagnoses. Dawn Downs is on keeping a food journal and adhering to recommended goals of 1750-1850 calories and 110+ protein and states she is following her eating plan approximately 60-70% of the time. Dawn Downs states she is not exercising.   Today's visit was #: 24 Starting weight: 258 lbs Starting date: 09/20/2021 Today's weight: 196 lbs Today's date: 02/12/2023 Total lbs lost to date: 62 lbs Total lbs lost since last in-office visit: +4 lbs  Interim History: She gained 6.2 lbs in muscle mass and lost 3 lbs in fat mass.  More mindful eating habits lately.  Breakfast, protein shake, 160 calories, 30 gram protein, lunch, core life bowl, high protein, low carb, dinner , lean protein.   Subjective:   1. Type 2 diabetes mellitus with obesity ([place]) Discussed labs with patient today. Trying to see Dr Lillie Columbia of [facility] for her endocrinopathy A1c 5.1 about 2 months ago.  She has been off Mounjaro 2 weeks now because of shortages.  Despite that she did great.    2. Vitamin D deficiency Discussed labs with patient today. Patient has been off all Vitamin D since June 2023.  Ionized calcium WNL's, and Vitamin is now at 91.  Patient is off all supplements.   3. Mood disorder ([place]) with emotional eating Patient mood is stable despite working 80+ hours per week and 2 jobs.  She is feeling in control and great about her accomplishments.    Assessment/Plan:  No orders of the defined types were placed in this encounter.   There are no discontinued medications.   No orders of the defined types were placed in this encounter.    1. Type 2 diabetes mellitus with obesity ([place]) Patient has prescription at pharmacy already for [facility].  Continue follow up with endo as instructed.   2. Vitamin D deficiency Vitamin D has now normalized since patient lost  so much weight this far.  She will follow up.   3. Mood disorder ([place]) with emotional eating Continue Wellbutrin XL, no emotional eating currently.   4. BMI 33.0-33.9,adult-current bmi 33.7  5. Morbidly obese (HCC)-START BMI 44.29 Patients goal is to increase purposeful exercise to make sure she gets enough calories in per day.   Dawn Downs is currently in the action stage of change. As such, her goal is to continue with weight loss efforts. She has agreed to keeping a food journal and adhering to recommended goals of 1750-1850 calories and 110+ protein.   Exercise goals:  As is.   Behavioral modification strategies: meal planning and cooking strategies, keeping healthy foods in the home, and planning for success.  Dawn Downs has agreed to follow-up with our clinic in 3 weeks. She was informed of the importance of frequent follow-up visits to maximize her success with intensive lifestyle modifications for her multiple health conditions.   Objective:   Blood pressure 132/85, pulse 80, temperature 97.7 F (36.5 C), height '5\' 4"'$  (1.626 m), weight 196 lb 3.2 oz (89 kg), SpO2 99 %. Body mass index is 33.68 kg/m.  General: Cooperative, alert, well developed, in no acute distress. HEENT: Conjunctivae and lids unremarkable. Cardiovascular: Regular rhythm.  Lungs: Normal work of breathing. Neurologic: No focal deficits.   Lab Results  Component Value Date   CREATININE 0.66 01/23/2023   BUN 12 01/23/2023   NA 142 01/23/2023   K 4.4 01/23/2023  CL 103 01/23/2023   CO2 20 01/23/2023   Lab Results  Component Value Date   ALT 9 01/23/2023   AST 10 01/23/2023   ALKPHOS 68 01/23/2023   BILITOT 0.3 01/23/2023   Lab Results  Component Value Date   HGBA1C 5.1 12/20/2022   HGBA1C 5.5 09/09/2022   HGBA1C 6.2 06/06/2022   HGBA1C 7.0 (H) 03/04/2022   HGBA1C 6.8 (H) 12/03/2021   Lab Results  Component Value Date   INSULIN 27.9 (H) 09/20/2021   Lab Results  Component Value Date   TSH  1.77 12/20/2022   Lab Results  Component Value Date   CHOL 173 12/20/2022   HDL 33.20 (L) 12/20/2022   LDLCALC 123 (H) 12/20/2022   TRIG 83.0 12/20/2022   CHOLHDL 5 12/20/2022   Lab Results  Component Value Date   VD25OH 91.2 01/23/2023   VD25OH 109.84 (HH) 12/20/2022   VD25OH CANCELED 10/03/2022   Lab Results  Component Value Date   WBC 10.9 (H) 12/20/2022   HGB 12.6 12/20/2022   HCT 37.8 12/20/2022   MCV 77.1 (L) 12/20/2022   PLT 353.0 12/20/2022   No results found for: "IRON", "TIBC", "FERRITIN"  Attestation Statements:   Reviewed by clinician on day of visit: allergies, medications, problem list, medical history, surgical history, family history, social history, and previous encounter notes.  I, Davy Pique, RMA, am acting as Location manager for [employer], DO.   I have reviewed the above documentation for accuracy and completeness, and I agree with the above. Marjory Sneddon, D.O.  The [place] was signed into law in 2016 which includes the topic of electronic health records.  This provides immediate access to information in MyChart.  This includes consultation notes, operative notes, office notes, lab results and pathology reports.  If you have any questions about what you read please let us know at your next visit so we can discuss your concerns and take corrective action if need be.  We are right here with you.

## 2023-02-25 NOTE — Progress Notes (Signed)
TeleHealth Visit:  This visit was completed with telemedicine (audio/video) technology. Dawn Downs has verbally consented to this TeleHealth visit. The patient is located at home, the provider is located at home. The participants in this visit include the listed provider and patient. The visit was conducted today via MyChart video.  OBESITY Dawn Downs is here to discuss her progress with her obesity treatment plan along with follow-up of her obesity related diagnoses.   Today's visit was # 25 Starting weight: 258 lbs Starting date: 09/20/2021 Weight at last in office visit: 196 lbs on 02/12/23 Total weight loss: 62 lbs at last in office visit on 02/12/23. Today's reported weight:  No weight reported.  Nutrition Plan: keeping a food journal with goal of 1750-1850 calories and 110 grams of protein daily.  Current exercise: none. Walks at job at [employer].   Interim History:  Dawn Downs has been off of Mounjaro 12.5 mg for 4 weeks now due to availability issues.  She reports that she is "ravenous". She has done an excellent job with weight loss since starting our program-has lost 62 pounds since 09/20/2021 (24% total weight loss). She says that she never did followed the category 3 consistently but used it as a guideline.  She also does not journal consistently.  She does have 3 meals per day and protein at each meal. She works 1 full-time job and 1 part-time job and does not have a lot of time for exercise.  She works at [employer] part-time in [employer] and walks when she has free time. Food recall: Breakfast: Chicken or [place] sausage, protein shake Lunch: Usually eats out-[employer] grilled nuggets and fruit cup or food from [employer].  She says that she lost 140 pounds from 2018-2020 but gained almost all of it back.  She says that her habits were not good when she was losing the weight.  Assessment/Plan:  We discussed recent lab results in depth.  1. Type II Diabetes HgbA1c  is at goal. Last A1c was 5.1 on 12/20/22. CBGs: Not checking Was on Mounjaro 12.5 mg weekly until 4 weeks ago but has been unable to get the medication due to the national drug shortage. Also takes metformin XR 500 mg twice daily.  Reports constipation has greatly improved since being off of the [facility]. Lab Results  Component Value Date   HGBA1C 5.1 12/20/2022   HGBA1C 5.5 09/09/2022   HGBA1C 6.2 06/06/2022   Lab Results  Component Value Date   MICROALBUR 1.1 09/19/2021   LDLCALC 123 (H) 12/20/2022   CREATININE 0.66 01/23/2023   Lab Results  Component Value Date   GFR 110.15 12/20/2022   GFR 104.78 09/09/2022   GFR 97.16 06/06/2022    Plan: Decrease dose of Mounjaro to 7.5 mg weekly since she has been off of the medication for 4 weeks. Continue metformin XR 500 mg twice daily.   2.  High serum vitamin D Most recent vitamin D level was 91.2 on 01/23/2023.  Calcium was normal. She is not on supplementation, vitamin D level increased after stopping supplementation. She has an appointment with Dr. Meredith Pel (endocrinology) in April. Lab Results  Component Value Date   VD25OH 91.2 01/23/2023   VD25OH 109.84 (HH) 12/20/2022   VD25OH CANCELED 10/03/2022    Plan: Follow-up with Dr. Meredith Pel in April.   3. Morbid Obesity: Current BMI 33 Dawn Downs is currently in the action stage of change. As such, her goal is to continue with weight loss efforts.  She has agreed to keeping  a food journal with goal of 1750-1850 calories and 110 grams of protein daily.  1.  Sent calorie and protein goals for meals via MyChart.  She will try to keep all meals within these parameters.  Exercise goals: as is  Behavioral modification strategies: increasing lean protein intake, decreasing simple carbohydrates , meal planning , and planning for success.  Dawn Downs has agreed to follow-up with our clinic in 2 weeks.   No orders of the defined types were placed in this encounter.   Medications  Discontinued During This Encounter  Medication Reason   tirzepatide (MOUNJARO) 12.5 MG/0.5ML Pen Dose change     Meds ordered this encounter  Medications   tirzepatide (MOUNJARO) 7.5 MG/0.5ML Pen    Sig: Inject 7.5 mg into the skin once a week.    Dispense:  2 mL    Refill:  0    Order Specific Question:   Supervising Provider    Answer:   Dell Ponto [[id number]]      Objective:   VITALS: Per patient if applicable, see vitals. GENERAL: Alert and in no acute distress. CARDIOPULMONARY: No increased WOB. Speaking in clear sentences.  PSYCH: Pleasant and cooperative. Speech normal rate and rhythm. Affect is appropriate. Insight and judgement are appropriate. Attention is focused, linear, and appropriate.  NEURO: Oriented as arrived to appointment on time with no prompting.   Attestation Statements:   Reviewed by clinician on day of visit: allergies, medications, problem list, medical history, surgical history, family history, social history, and previous encounter notes.   This was prepared with the assistance of Presenter, broadcasting.  Occasional wrong-word or sound-a-like substitutions may have occurred due to the inherent limitations of voice recognition software.

## 2023-03-04 ENCOUNTER — Encounter: Payer: Self-pay | Admitting: Family Medicine

## 2023-03-13 ENCOUNTER — Encounter (INDEPENDENT_AMBULATORY_CARE_PROVIDER_SITE_OTHER): Payer: Self-pay | Admitting: Family Medicine

## 2023-03-13 ENCOUNTER — Ambulatory Visit (INDEPENDENT_AMBULATORY_CARE_PROVIDER_SITE_OTHER): Payer: Commercial Managed Care - PPO | Admitting: Family Medicine

## 2023-03-13 ENCOUNTER — Other Ambulatory Visit (HOSPITAL_COMMUNITY): Payer: Self-pay

## 2023-03-13 VITALS — BP 147/87 | HR 65 | Temp 98.6°F | Ht 64.0 in | Wt 201.6 lb

## 2023-03-13 DIAGNOSIS — Z6841 Body Mass Index (BMI) 40.0 and over, adult: Secondary | ICD-10-CM

## 2023-03-13 DIAGNOSIS — Z7985 Long-term (current) use of injectable non-insulin antidiabetic drugs: Secondary | ICD-10-CM

## 2023-03-13 DIAGNOSIS — F39 Unspecified mood [affective] disorder: Secondary | ICD-10-CM

## 2023-03-13 DIAGNOSIS — E1169 Type 2 diabetes mellitus with other specified complication: Secondary | ICD-10-CM | POA: Diagnosis not present

## 2023-03-13 MED ORDER — TIRZEPATIDE 10 MG/0.5ML ~~LOC~~ SOAJ
10.0000 mg | SUBCUTANEOUS | 0 refills | Status: DC
  Filled 2023-03-13: qty 2, 28d supply, fill #0

## 2023-03-13 NOTE — Progress Notes (Signed)
Dawn Downs, D.O.  ABFM, ABOM Specializing in Clinical Bariatric Medicine  Office located at: [address],   [address]     Assessment and Plan:   Medications Discontinued During This Encounter  Medication Reason   tirzepatide (MOUNJARO) 7.5 MG/0.5ML Pen Dose change    Meds ordered this encounter  Medications   tirzepatide (MOUNJARO) 10 MG/0.5ML Pen    Sig: Inject 10 mg into the skin once a week.    Dispense:  2 mL    Refill:  0     Type 2 diabetes mellitus with obesity (HCC) Assessment: Condition is Controlled.. Labs were reviewed.  Lab Results  Component Value Date   HGBA1C 5.1 12/20/2022   HGBA1C 5.5 09/09/2022   HGBA1C 6.2 06/06/2022   INSULIN 27.9 (H) 09/20/2021  She has been compliant with Mounjaro 7.5mg  weekly- apart from 1 month wherein she was unable to get it filled due to pharmacy supply shortages. She feels that she would benefit from the higher dose due to her cravings and hunger.  Plan:Will increase Mounjaro to 10mg  weekly. - Stressed importance of dietary and lifestyle modifications to result in weight loss as first line txmnt - In addition, we discussed the risks and benefits of various medication options which can help [place] in the management of this disease process as well as with weight loss.  Will consider starting one of these meds in future as we will focus on prudent nutritional plan at this time.  - Continue to decrease simple carbs/ sugars; increase fiber and proteins -> follow her meal plan.   Dawn Downs will continue to work on weight loss, exercise, via their meal plan we devised to help decrease the risk of progressing to diabetes.  - We will recheck A1c and fasting insulin level in approximately 3 months from last check, or as deemed appropriate.    Mood disorder ([place]) with emotional eating Assessment: Condition is Not optimized.. Denies any SI/HI. Mood is stable- but she reports significant stress with work. Cravings  and hunger are not well controlled. She feels that she could benefit from the higher Mounjaro dose.  Plan: Will Continue Wellbutrin XL 300mg  daily. Will increase Mounjaro to 10mg  weekly. -Discussed how thoughts affect eating habits, modeling of thoughts, feelings, and behaviors, and strategies for change.  Importance of not skipping meals and getting all her proteins and fiber in on a daily basis discussed.   - Discussed cognitive distortions, coping thoughts, and how to change our thoughts/ self talk regarding foods/ eating patterns. - Behavior modification techniques were discussed today to help deal with emotional/ non-hunger eating behaviors including but not limited to exercise for stress management, meditation/prayer, behavorial sessions with her therapist and self care activities like adequate sleep (7-9 hrs/nite). - Reminded patient of the importance of following their prudent nutrition plan and how food can affect mood as well to support emotional wellbeing.  - We will continue to monitor closely alongside PCP / other specialists.   TREATMENT PLAN FOR OBESITY:  BMI 40.0-44.9, adult (HCC)-CURRENT 34.6 Morbid obesity: Starting BMI 44.2/DATE 09/20/21 Assessment: Condition is Not at goal.  Plan: Continue keeping a food journal and adhering to recommended goals of 1750-1850 calories and 110+ protein. She would like to work on planning meals to better adhere to meal plan.   Recommended Dietary Goals Dawn Downs is currently in the action stage of change. As such, her goal is to continue weight management plan.  Behavioral Intervention Additional resources provided today:  recipe packet #1 and recipe packet #2 Evidence-based interventions for health behavior change were utilized today including the discussion of self monitoring techniques, problem-solving barriers and SMART goal setting techniques.   Regarding patient's less desirable eating habits and patterns, we employed the technique of  small changes.  Pt will specifically work on: improve adherence to meal plan, work on stress management for next visit.    Recommended Physical Activity Goals Dawn Downs has been advised to work up to 150 minutes of moderate intensity aerobic activity a week and strengthening exercises 2-3 times per week for cardiovascular health, weight loss maintenance and preservation of muscle mass.  She has agreed to Continue current level of physical activity    FOLLOW UP: No follow-ups on file. She was informed of the importance of frequent follow up visits to maximize her success with intensive lifestyle modifications for her multiple health conditions.  Subjective:   Chief complaint: Obesity Dawn Downs is here to discuss her progress with her obesity treatment plan. She is on the keeping a food journal and adhering to recommended goals of 1750-1850 calories and 110+ protein and states she is following her eating plan approximately 0% of the time. She states she is not exercising.  Interval History:  Dawn Downs is here for a follow up office visit. We reviewed her meal plan and all questions were answered. Patient's food recall appears to be accurate and consistent with what is on plan when she is following it. When eating on plan, her hunger and cravings are well controlled.     Since last office visit she states that she has struggled with her meal plan due to her very busy work schedule.  Pharmacotherapy for weight loss: She is currently taking  Mounjaro 7.5mg  weekly  for DM and medical weight loss. Denies side effects. However, she was unable to get the [facility] for 1 month due to a pharmacy supply shortage. She does feel that she would benefit from the higher dose given her hunger and cravings.  Review of Systems:  Pertinent positives were addressed with patient today.  Weight Summary and Biometrics   Weight Lost Since Last Visit: +5lb   Vitals Temp: 98.6 F (37 C) BP: (!)  147/87 Pulse Rate: 65 SpO2: 97 %   Anthropometric Measurements Height: 5\' 4"  (1.626 m) Weight: 201 lb 9.6 oz (91.4 kg) BMI (Calculated): 34.59 Weight at Last Visit: 196LB Weight Lost Since Last Visit: +5lb Starting Weight: 258LB Total Weight Loss (lbs): 57 lb (25.9 kg) Peak Weight: 281.6lb   Body Composition  Body Fat %: 42.3 % Fat Mass (lbs): 85.2 lbs Muscle Mass (lbs): 110.6 lbs Total Body Water (lbs): 82.6 lbs Visceral Fat Rating : 10   Other Clinical Data Fasting: NO Labs: NO Today's Visit #: 26 Starting Date: 09/20/21    Objective:   PHYSICAL EXAM:  Blood pressure (!) 147/87, pulse 65, temperature 98.6 F (37 C), height 5\' 4"  (1.626 m), weight 201 lb 9.6 oz (91.4 kg), SpO2 97 %. Body mass index is 34.6 kg/m.  General: Well Developed, well nourished, and in no acute distress.  HEENT: Normocephalic, atraumatic Skin: Warm and dry, cap RF less 2 sec, good turgor Chest:  Normal excursion, shape, no gross abn Respiratory: speaking in full sentences, no conversational dyspnea NeuroM-Sk: Ambulates w/o assistance, moves * 4 Psych: A and O *3, insight good, mood-full  DIAGNOSTIC DATA REVIEWED:  BMET    Component Value Date/Time   NA 142 01/23/2023 0936   K 4.4 01/23/2023  0936   CL 103 01/23/2023 0936   CO2 20 01/23/2023 0936   GLUCOSE 81 01/23/2023 0936   GLUCOSE 80 12/20/2022 0848   BUN 12 01/23/2023 0936   CREATININE 0.66 01/23/2023 0936   CREATININE 0.53 12/21/2020 1043   CALCIUM 9.5 01/23/2023 0936   GFRNONAA >60 11/10/2009 1045   GFRAA  11/10/2009 1045    >60        The eGFR has been calculated using the MDRD equation. This calculation has not been validated in all clinical situations. eGFR's persistently <60 mL/min signify possible Chronic Kidney Disease.   Lab Results  Component Value Date   HGBA1C 5.1 12/20/2022   HGBA1C  05/31/2009    5.1 (NOTE) The ADA recommends the following therapeutic goal for glycemic control related to Hgb  A1c measurement: Goal of therapy: <6.5 Hgb A1c  Reference: American Diabetes Association: Clinical Practice Recommendations 2010, Diabetes Care, 2010, 33: (Suppl  1).   Lab Results  Component Value Date   INSULIN 27.9 (H) 09/20/2021   Lab Results  Component Value Date   TSH 1.77 12/20/2022   CBC    Component Value Date/Time   WBC 10.9 (H) 12/20/2022 0848   RBC 4.90 12/20/2022 0848   HGB 12.6 12/20/2022 0848   HCT 37.8 12/20/2022 0848   PLT 353.0 12/20/2022 0848   MCV 77.1 (L) 12/20/2022 0848   MCH 26.5 (L) 12/21/2020 1043   MCHC 33.4 12/20/2022 0848   RDW 15.9 (H) 12/20/2022 0848   Iron Studies No results found for: "IRON", "TIBC", "FERRITIN", "IRONPCTSAT" Lipid Panel     Component Value Date/Time   CHOL 173 12/20/2022 0848   TRIG 83.0 12/20/2022 0848   HDL 33.20 (L) 12/20/2022 0848   CHOLHDL 5 12/20/2022 0848   VLDL 16.6 12/20/2022 0848   LDLCALC 123 (H) 12/20/2022 0848   LDLCALC 127 (H) 12/21/2020 1043   Hepatic Function Panel     Component Value Date/Time   PROT 6.8 01/23/2023 0936   ALBUMIN 4.7 01/23/2023 0936   AST 10 01/23/2023 0936   ALT 9 01/23/2023 0936   ALKPHOS 68 01/23/2023 0936   BILITOT 0.3 01/23/2023 0936   BILIDIR 0.0 12/20/2022 0848   IBILI 0.3 12/21/2020 1043      Component Value Date/Time   TSH 1.77 12/20/2022 0848   Nutritional Lab Results  Component Value Date   VD25OH 91.2 01/23/2023   VD25OH 109.84 ([place]) 12/20/2022   VD25OH CANCELED 10/03/2022    Attestations:   Reviewed by clinician on day of visit: allergies, medications, problem list, medical history, surgical history, family history, social history, and previous encounter notes.  I,Special Puri,acting as a Education administrator for [employer], DO.,have documented all relevant documentation on the behalf of Mellody Dance, DO,as directed by  Mellody Dance, DO while in the presence of Mellody Dance, DO.   I, Mellody Dance, DO, have reviewed all documentation for this visit. The  documentation on 03/13/23 for the exam, diagnosis, procedures, and orders are all accurate and complete.

## 2023-03-16 ENCOUNTER — Other Ambulatory Visit: Payer: Self-pay | Admitting: Family Medicine

## 2023-03-16 DIAGNOSIS — E1165 Type 2 diabetes mellitus with hyperglycemia: Secondary | ICD-10-CM

## 2023-03-18 ENCOUNTER — Ambulatory Visit (INDEPENDENT_AMBULATORY_CARE_PROVIDER_SITE_OTHER): Payer: Commercial Managed Care - PPO | Admitting: Family Medicine

## 2023-03-18 ENCOUNTER — Encounter: Payer: Self-pay | Admitting: Family Medicine

## 2023-03-18 VITALS — BP 130/80 | HR 77 | Temp 97.5°F | Resp 17 | Ht 64.0 in | Wt 205.4 lb

## 2023-03-18 DIAGNOSIS — E1169 Type 2 diabetes mellitus with other specified complication: Secondary | ICD-10-CM | POA: Diagnosis not present

## 2023-03-18 DIAGNOSIS — E669 Obesity, unspecified: Secondary | ICD-10-CM | POA: Diagnosis not present

## 2023-03-18 LAB — BASIC METABOLIC PANEL
BUN: 22 mg/dL (ref 6–23)
CO2: 23 mEq/L (ref 19–32)
Calcium: 9.8 mg/dL (ref 8.4–10.5)
Chloride: 104 mEq/L (ref 96–112)
Creatinine, Ser: 0.63 mg/dL (ref 0.40–1.20)
GFR: 105.95 mL/min (ref >60.00)
Glucose, Bld: 81 mg/dL (ref 70–99)
Potassium: 4.3 mEq/L (ref 3.5–5.1)
Sodium: 137 mEq/L (ref 135–145)

## 2023-03-18 LAB — HEMOGLOBIN A1C: Hgb A1c MFr Bld: 5.4 % (ref 4.6–6.5)

## 2023-03-18 NOTE — Assessment & Plan Note (Signed)
Chronic problem.  Currently on Metformin XR 500mg  BID, on Mounjaro weekly.  Has eye exam scheduled.  UTD on foot exam.  Microalbumin ordered.  Check labs.  Adjust meds prn

## 2023-03-18 NOTE — Patient Instructions (Signed)
Schedule your complete physical in 3-4 months We'll notify you of your lab results and make any changes if needed Continue to work on healthy diet and regular exercise- you can do it! Call with any questions or concerns Stay Safe!  Stay Healthy! HAPPY BIRTHDAY!!!

## 2023-03-18 NOTE — Progress Notes (Signed)
   Subjective:    Patient ID: Dawn Downs, female    DOB: 1976/01/30, 47 y.o.   MRN: [id number]  HPI DM- chronic problem.  Currently on Metformin XR 500mg  BID.  UTD on foot exam.  Due for microalbumin and eye exam (scheduled).  Pt gained 10 lbs since 2/14 visit- did not have Mounjaro.  Pt reports feeling 'pretty good'.  No CP, SOB, HA's, visual changes, abd pain, N/V.  No symptomatic lows.  No numbness/tingling of hands/feet.   Review of Systems For ROS see HPI     Objective:   Physical Exam Vitals reviewed.  Constitutional:      General: She is not in acute distress.    Appearance: Normal appearance. She is well-developed. She is obese.  HENT:     Head: Normocephalic and atraumatic.  Eyes:     Conjunctiva/sclera: Conjunctivae normal.     Pupils: Pupils are equal, round, and reactive to light.  Neck:     Thyroid: No thyromegaly.  Cardiovascular:     Rate and Rhythm: Normal rate and regular rhythm.     Pulses: Normal pulses.     Heart sounds: Normal heart sounds. No murmur heard. Pulmonary:     Effort: Pulmonary effort is normal. No respiratory distress.     Breath sounds: Normal breath sounds.  Abdominal:     General: There is no distension.     Palpations: Abdomen is soft.     Tenderness: There is no abdominal tenderness.  Musculoskeletal:     Cervical back: Normal range of motion and neck supple.     Right lower leg: No edema.     Left lower leg: No edema.  Lymphadenopathy:     Cervical: No cervical adenopathy.  Skin:    General: Skin is warm and dry.  Neurological:     General: No focal deficit present.     Mental Status: She is alert and oriented to person, place, and time.  Psychiatric:        Mood and Affect: Mood normal.        Behavior: Behavior normal.           Assessment & Plan:

## 2023-03-19 ENCOUNTER — Telehealth: Payer: Self-pay

## 2023-03-19 NOTE — Telephone Encounter (Signed)
-----   Message from Midge Minium, MD sent at 03/19/2023  7:49 AM EDT ----- Labs are EXCELLENT!!!  Keep up the good work!!!

## 2023-03-19 NOTE — Telephone Encounter (Signed)
Left pt lab results on pt VM

## 2023-03-20 ENCOUNTER — Ambulatory Visit
Admission: RE | Admit: 2023-03-20 | Discharge: 2023-03-20 | Disposition: A | Discharge status: Home / Self Care | Payer: Commercial Managed Care - PPO | Source: Ambulatory Visit | Attending: Family Medicine | Admitting: Family Medicine

## 2023-03-20 DIAGNOSIS — Z1231 Encounter for screening mammogram for malignant neoplasm of breast: Secondary | ICD-10-CM

## 2023-03-21 ENCOUNTER — Ambulatory Visit: Payer: Commercial Managed Care - PPO | Admitting: Podiatry

## 2023-04-03 ENCOUNTER — Encounter (INDEPENDENT_AMBULATORY_CARE_PROVIDER_SITE_OTHER): Payer: Self-pay | Admitting: Family Medicine

## 2023-04-03 ENCOUNTER — Ambulatory Visit (INDEPENDENT_AMBULATORY_CARE_PROVIDER_SITE_OTHER): Payer: Commercial Managed Care - PPO | Admitting: Family Medicine

## 2023-04-03 ENCOUNTER — Other Ambulatory Visit (HOSPITAL_COMMUNITY): Payer: Self-pay

## 2023-04-03 VITALS — BP 149/90 | HR 77 | Temp 98.6°F | Ht 64.0 in | Wt 200.8 lb

## 2023-04-03 DIAGNOSIS — F39 Unspecified mood [affective] disorder: Secondary | ICD-10-CM | POA: Diagnosis not present

## 2023-04-03 DIAGNOSIS — E669 Obesity, unspecified: Secondary | ICD-10-CM

## 2023-04-03 DIAGNOSIS — E1169 Type 2 diabetes mellitus with other specified complication: Secondary | ICD-10-CM | POA: Diagnosis not present

## 2023-04-03 DIAGNOSIS — Z7984 Long term (current) use of oral hypoglycemic drugs: Secondary | ICD-10-CM

## 2023-04-03 DIAGNOSIS — K5900 Constipation, unspecified: Secondary | ICD-10-CM

## 2023-04-03 DIAGNOSIS — Z6841 Body Mass Index (BMI) 40.0 and over, adult: Secondary | ICD-10-CM | POA: Insufficient documentation

## 2023-04-03 DIAGNOSIS — Z6837 Body mass index (BMI) 37.0-37.9, adult: Secondary | ICD-10-CM | POA: Insufficient documentation

## 2023-04-03 DIAGNOSIS — Z6833 Body mass index (BMI) 33.0-33.9, adult: Secondary | ICD-10-CM | POA: Insufficient documentation

## 2023-04-03 DIAGNOSIS — Z7985 Long-term (current) use of injectable non-insulin antidiabetic drugs: Secondary | ICD-10-CM

## 2023-04-03 MED ORDER — TIRZEPATIDE 12.5 MG/0.5ML ~~LOC~~ SOAJ
12.5000 mg | SUBCUTANEOUS | 0 refills | Status: DC
  Filled 2023-04-03: qty 2, 28d supply, fill #0

## 2023-04-03 NOTE — Progress Notes (Signed)
Dawn Downs, D.O.  ABFM, ABOM Specializing in Clinical Bariatric Medicine  Office located at: [address] [place], [place]  [address]     Assessment and Plan:   Medications Discontinued During This Encounter  Medication Reason   tirzepatide [facility]) 10 MG/0.5ML Pen      Meds ordered this encounter  Medications   tirzepatide (MOUNJARO) 12.5 MG/0.5ML Pen    Sig: Inject 12.5 mg into the skin once a week.    Dispense:  2 mL    Refill:  0      Type 2 diabetes mellitus with obesity Assessment: Condition is Improving, but not optimized.. Labs were reviewed.  Lab Results  Component Value Date   HGBA1C 5.4 03/18/2023   HGBA1C 5.1 12/20/2022   HGBA1C 5.5 09/09/2022   INSULIN 27.9 (H) 09/20/2021   Plan:Will Continue Metformin 500 mg 2x daily. Will increase Mounjaro to 12.5 mg weekly.  - I counseled patient on pathophysiology of the disease process of I.R. - Stressed importance of dietary and lifestyle modifications to result in weight loss as first line txmnt - In addition, we discussed the risks and benefits of various medication options which can help [place] in the management of this disease process as well as with weight loss.  Will consider starting one of these meds in future as we will focus on prudent nutritional plan at this time.  - Continue to decrease simple carbs/ sugars; increase fiber and proteins -> follow her meal plan.   - Explained role of simple carbs and insulin levels on hunger and cravings - Handouts provided at pt's request after education provided.  All concerns/questions addressed.   - Anticipatory guidance given.   Dawn Downs will continue to work on weight loss, exercise, via their meal plan we devised to help decrease the risk of progressing to diabetes.  - We will recheck A1c and fasting insulin level in approximately 3 months from last check, or as deemed appropriate.     Constipation  Assessment: Condition is improving and stable.  She  denies experiencing constipation despite increasing Monujario to 10 mg.  Plan:Continue Magnesium chloride powder 325 mg BID.     Mood disorder (HCC) with emotional eating Assessment: Condition is Not at goal.. Denies any SI/HI. Mood is stable. She reports experiencing some stress with work. Her EE is well controlled   Plan:  Will Continue Wellbutrin XL 300mg  daily.  Discussed how thoughts affect eating habits, modeling of thoughts, feelings, and behaviors, and strategies for change.  Importance of not skipping meals and getting all her proteins and fiber in on a daily basis discussed.   - Discussed cognitive distortions, coping thoughts, and how to change our thoughts/ self talk regarding foods/ eating patterns. - No SI/ HI.  Mood stable currently - Behavior modification techniques were discussed today to help deal with emotional/ non-hunger eating behaviors including but not limited to exercise for stress management, meditation/prayer, behavorial sessions with her therapist and self care activities like adequate sleep (7-9 hrs/nite). - Importance of following up with PCP and others was stressed  - Educational handouts given to pt at their request - Reminded patient of the importance of following their prudent nutrition plan and how food can affect mood as well to support emotional wellbeing.  - We will continue to monitor closely alongside PCP / other specialists.     Latest Ref Rng & Units 03/18/2023    8:04 AM 01/23/2023    9:36 AM 12/20/2022  8:48 AM  BMP  Glucose 70 - 99 mg/dL 81  81  80   BUN 6 - 23 mg/dL 22  12  18    Creatinine 0.40 - 1.20 mg/dL 1.61  0.96  0.45   BUN/Creat Ratio 9 - 23  18    Sodium 135 - 145 mEq/L 137  142  138   Potassium 3.5 - 5.1 mEq/L 4.3  4.4  3.9   Chloride 96 - 112 mEq/L 104  103  105   CO2 19 - 32 mEq/L 23  20  26    Calcium 8.4 - 10.5 mg/dL 9.8  9.5  9.4     Morbid obesity: Starting BMI 44.2/DATE 09/20/21 BMI 33.0-33.9,adult Assessment:  Condition is Improving, but not optimized.. Biometric data collected today, was reviewed with patient.  Fat mass has decreased by 4 lb. Muscle mass has increased by 3 lb. Total body water has decreased by .2lb.   Plan: Continue keeping a food journal and adhering to recommended goals of 1750-1850 calories and 110+ protein. She would like to work on planning meals to better adhere to meal plan.   TREATMENT PLAN FOR OBESITY:  Recommended Dietary Goals Dawn Downs is currently in the action stage of change. As such, her goal is to continue weight management plan. She has agreed to continue keeping a food journal and adhering to recommended goals of 1750-1850 calories and 110+ protein.  Behavioral Intervention Additional resources provided today: patient declined Evidence-based interventions for health behavior change were utilized today including the discussion of self monitoring techniques, problem-solving barriers and SMART goal setting techniques.   Regarding patient's less desirable eating habits and patterns, we employed the technique of small changes.  Pt will specifically work on: increasing water intake for next visit.    Recommended Physical Activity Goals Dawn Downs has been advised to work up to 150 minutes of moderate intensity aerobic activity a week and strengthening exercises 2-3 times per week for cardiovascular health, weight loss maintenance and preservation of muscle mass.  She has agreed to Continue current level of physical activity   Pharmacotherapy We discussed various medication options to help Dawn Downs with her weight loss efforts and we both agreed to increase Mounjaro to 12.5 mg weekly. .   FOLLOW UP: Return in about 3 weeks (around 04/24/2023). She was informed of the importance of frequent follow up visits to maximize her success with intensive lifestyle modifications for her multiple health conditions.   Subjective:   Chief complaint: Obesity Dawn Downs is here to  discuss her progress with her obesity treatment plan. She is on the keeping a food journal and adhering to recommended goals of 1750-1850 calories and 110+ protein and states she is following her eating plan approximately 60% of the time. She states she is exercising 45 minutes 4-5 days per week.  Interval History:  Dawn Downs is here for a follow up office visit. We reviewed her meal plan and all questions were answered. Patient's food recall appears to be accurate and consistent with what is on plan when she is following it. When eating on plan, her hunger and cravings are well controlled.     Since last office visit she reports having stress in her life. She is states she's been looking for a new job. She reports that she's been walking more as it helps he stress. She reports that tanning and mediating helps her with her stress.  Patient endorses she was rushing to get here and thus she had elevated blood  pressure. Denies any history of high blood pressure.   Pharmacotherapy for weight loss: She is currently taking  Mounjaro 10 mg  for medical weight loss.  Denies side effects.    Review of Systems:  Pertinent positives were addressed with patient today.  Weight Summary and Biometrics   Weight Lost Since Last Visit: 1lb  No data recorded   Vitals Temp: 98.6 F (37 C) BP: (!) 149/90 Pulse Rate: 77 SpO2: 98 %   Anthropometric Measurements Height: 5\' 4"  (1.626 m) Weight: 200 lb 12.8 oz (91.1 kg) BMI (Calculated): 34.45 Weight at Last Visit: 201lb Weight Lost Since Last Visit: 1lb Starting Weight: 258lb Total Weight Loss (lbs): 58 lb (26.3 kg) Peak Weight: 281.6lb   Body Composition  Body Fat %: 40.4 % Fat Mass (lbs): 81.2 lbs Muscle Mass (lbs): 113.6 lbs Total Body Water (lbs): 78.4 lbs Visceral Fat Rating : 10   Other Clinical Data Fasting: yes Labs: no Today's Visit #: 27 Starting Date: 09/20/21    Objective:   PHYSICAL EXAM:  Blood pressure (!)  149/90, pulse 77, temperature 98.6 F (37 C), height 5\' 4"  (1.626 m), weight 200 lb 12.8 oz (91.1 kg), SpO2 98 %. Body mass index is 34.47 kg/m.  General: Well Developed, well nourished, and in no acute distress.  HEENT: Normocephalic, atraumatic Skin: Warm and dry, cap RF less 2 sec, good turgor Chest:  Normal excursion, shape, no gross abn Respiratory: speaking in full sentences, no conversational dyspnea NeuroM-Sk: Ambulates w/o assistance, moves * 4 Psych: A and O *3, insight good, mood-full  DIAGNOSTIC DATA REVIEWED:  BMET    Component Value Date/Time   NA 137 03/18/2023 0804   NA 142 01/23/2023 0936   K 4.3 03/18/2023 0804   CL 104 03/18/2023 0804   CO2 23 03/18/2023 0804   GLUCOSE 81 03/18/2023 0804   BUN 22 03/18/2023 0804   BUN 12 01/23/2023 0936   CREATININE 0.63 03/18/2023 0804   CREATININE 0.53 12/21/2020 1043   CALCIUM 9.8 03/18/2023 0804   GFRNONAA >60 11/10/2009 1045   GFRAA  11/10/2009 1045    >60        The eGFR has been calculated using the MDRD equation. This calculation has not been validated in all clinical situations. eGFR's persistently <60 mL/min signify possible Chronic Kidney Disease.   Lab Results  Component Value Date   HGBA1C 5.4 03/18/2023   HGBA1C  05/31/2009    5.1 (NOTE) The ADA recommends the following therapeutic goal for glycemic control related to Hgb A1c measurement: Goal of therapy: <6.5 Hgb A1c  Reference: American Diabetes Association: Clinical Practice Recommendations 2010, Diabetes Care, 2010, 33: (Suppl  1).   Lab Results  Component Value Date   INSULIN 27.9 (H) 09/20/2021   Lab Results  Component Value Date   TSH 1.77 12/20/2022   CBC    Component Value Date/Time   WBC 10.9 (H) 12/20/2022 0848   RBC 4.90 12/20/2022 0848   HGB 12.6 12/20/2022 0848   HCT 37.8 12/20/2022 0848   PLT 353.0 12/20/2022 0848   MCV 77.1 (L) 12/20/2022 0848   MCH 26.5 (L) 12/21/2020 1043   MCHC 33.4 12/20/2022 0848   RDW 15.9 (H)  12/20/2022 0848   Iron Studies No results found for: "IRON", "TIBC", "FERRITIN", "IRONPCTSAT" Lipid Panel     Component Value Date/Time   CHOL 173 12/20/2022 0848   TRIG 83.0 12/20/2022 0848   HDL 33.20 (L) 12/20/2022 0848   CHOLHDL 5 12/20/2022 0848  VLDL 16.6 12/20/2022 0848   LDLCALC 123 (H) 12/20/2022 0848   LDLCALC 127 (H) 12/21/2020 1043   Hepatic Function Panel     Component Value Date/Time   PROT 6.8 01/23/2023 0936   ALBUMIN 4.7 01/23/2023 0936   AST 10 01/23/2023 0936   ALT 9 01/23/2023 0936   ALKPHOS 68 01/23/2023 0936   BILITOT 0.3 01/23/2023 0936   BILIDIR 0.0 12/20/2022 0848   IBILI 0.3 12/21/2020 1043      Component Value Date/Time   TSH 1.77 12/20/2022 0848   Nutritional Lab Results  Component Value Date   VD25OH 91.2 01/23/2023   VD25OH 109.84 (HH) 12/20/2022   VD25OH CANCELED 10/03/2022    Attestations:   Reviewed by clinician on day of visit: allergies, medications, problem list, medical history, surgical history, family history, social history, and previous encounter notes.  I,Safa M Kadhim,acting as a scribe for [employer], DO.,have documented all relevant documentation on the behalf of Thomasene Lot, DO,as directed by  Thomasene Lot, DO while in the presence of Thomasene Lot, DO.   I, Thomasene Lot, DO, have reviewed all documentation for this visit. The documentation on 04/03/23 for the exam, diagnosis, procedures, and orders are all accurate and complete.

## 2023-04-10 ENCOUNTER — Other Ambulatory Visit (HOSPITAL_COMMUNITY): Payer: Self-pay

## 2023-04-14 ENCOUNTER — Other Ambulatory Visit (HOSPITAL_COMMUNITY): Payer: Self-pay

## 2023-04-18 ENCOUNTER — Ambulatory Visit (INDEPENDENT_AMBULATORY_CARE_PROVIDER_SITE_OTHER): Payer: Commercial Managed Care - PPO

## 2023-04-18 ENCOUNTER — Encounter: Payer: Self-pay | Admitting: Podiatry

## 2023-04-18 ENCOUNTER — Ambulatory Visit: Payer: Commercial Managed Care - PPO | Admitting: Podiatry

## 2023-04-18 DIAGNOSIS — M2012 Hallux valgus (acquired), left foot: Secondary | ICD-10-CM | POA: Diagnosis not present

## 2023-04-18 DIAGNOSIS — Z8719 Personal history of other diseases of the digestive system: Secondary | ICD-10-CM | POA: Insufficient documentation

## 2023-04-18 DIAGNOSIS — M2011 Hallux valgus (acquired), right foot: Secondary | ICD-10-CM | POA: Diagnosis not present

## 2023-04-18 NOTE — Progress Notes (Signed)
   Chief Complaint  Patient presents with   Foot Pain    1st MPJ bilateral - bunion deformity x years, aches occasionally, wanted to discuss any preventive measures before they worse   New Patient (Initial Visit)    Subjective: 47 y.o. female presents today for evaluation of minimally symptomatic bunion to the bilateral feet.  Patient states that they are not necessarily painful and only occasionally tender depending on shoes especially in the wintertime.  They are somewhat unsightly.  Patient states that online she has been seeing different splints and straps to correct for her bunion.  She presents to discuss bunion pathology and conservative treatment options  Past Medical History:  Diagnosis Date   Anxiety    Bilateral swelling of feet and ankles    Depression    Diabetes mellitus without complication    Gallbladder problem    GERD (gastroesophageal reflux disease)    Hyperlipidemia    Hypertension    Other fatigue    Pancreatitis due to common bile duct stone    PCOS (polycystic ovarian syndrome)    Shortness of breath on exertion    UTI (urinary tract infection)     Past Surgical History:  Procedure Laterality Date   CHOLECYSTECTOMY     OVARIAN CYST REMOVAL      No Known Allergies   Objective: Physical Exam General: The patient is alert and oriented x3 in no acute distress.  Dermatology: Skin is cool, dry and supple bilateral lower extremities. Negative for open lesions or macerations.  Vascular: Palpable pedal pulses bilaterally. No edema or erythema noted. Capillary refill within normal limits.  Neurological: Epicritic and protective threshold grossly intact bilaterally.   Musculoskeletal Exam: Clinical evidence of bunion deformity noted to the respective foot. There is moderate pain on palpation range of motion of the first MPJ. Lateral deviation of the hallux noted consistent with hallux abductovalgus.  Radiographic Exam B/L feet 04/18/2023: Normal osseous  mineralization.  No acute fractures identified.  Slightly increased intermetatarsal angle consistent with mild to moderate bunion deformity.  Assessment: 1.  Hallux valgus bilateral; minimally symptomatic  -Patient evaluated.  X-rays reviewed -Recommend conservative treatment especially since they are not necessarily painful or symptomatic -Recommend arch supports to support the medial longitudinal arch of the foot.  Advised against going barefoot.  Recommend wide fitting shoes that do not irritate or constrict the toebox area -Return to clinic as needed      Felecia Shelling, DPM [facility]  Dr. Felecia Shelling, DPM    [address] [place], [place] [address]                Office [phone]  Fax [phone]

## 2023-04-24 ENCOUNTER — Other Ambulatory Visit (HOSPITAL_COMMUNITY): Payer: Self-pay

## 2023-04-24 ENCOUNTER — Encounter (INDEPENDENT_AMBULATORY_CARE_PROVIDER_SITE_OTHER): Payer: Self-pay | Admitting: Family Medicine

## 2023-04-24 ENCOUNTER — Ambulatory Visit (INDEPENDENT_AMBULATORY_CARE_PROVIDER_SITE_OTHER): Payer: Commercial Managed Care - PPO | Admitting: Family Medicine

## 2023-04-24 VITALS — BP 113/66 | HR 72 | Temp 97.5°F | Ht 64.0 in | Wt 203.4 lb

## 2023-04-24 DIAGNOSIS — Z7984 Long term (current) use of oral hypoglycemic drugs: Secondary | ICD-10-CM

## 2023-04-24 DIAGNOSIS — F39 Unspecified mood [affective] disorder: Secondary | ICD-10-CM | POA: Diagnosis not present

## 2023-04-24 DIAGNOSIS — Z6834 Body mass index (BMI) 34.0-34.9, adult: Secondary | ICD-10-CM | POA: Diagnosis not present

## 2023-04-24 DIAGNOSIS — E1169 Type 2 diabetes mellitus with other specified complication: Secondary | ICD-10-CM

## 2023-04-24 DIAGNOSIS — Z6841 Body Mass Index (BMI) 40.0 and over, adult: Secondary | ICD-10-CM

## 2023-04-24 DIAGNOSIS — Z7985 Long-term (current) use of injectable non-insulin antidiabetic drugs: Secondary | ICD-10-CM

## 2023-04-24 MED ORDER — TIRZEPATIDE 12.5 MG/0.5ML ~~LOC~~ SOAJ
12.5000 mg | SUBCUTANEOUS | 0 refills | Status: DC
  Filled 2023-04-24: qty 2, 28d supply, fill #0

## 2023-04-24 NOTE — Progress Notes (Signed)
Dawn Downs, D.O.  ABFM, ABOM Specializing in Clinical Bariatric Medicine  Office located at: [address] [place], [place]  [address]     Assessment and Plan:   No orders of the defined types were placed in this encounter.   Medications Discontinued During This Encounter  Medication Reason   tirzepatide [facility]) 12.5 MG/0.5ML Pen Reorder     Meds ordered this encounter  Medications   tirzepatide (MOUNJARO) 12.5 MG/0.5ML Pen    Sig: Inject 12.5 mg into the skin once a week.    Dispense:  2 mL    Refill:  0     Type 2 diabetes mellitus with obesity  Assessment: Condition is not optimized.  Lab Results  Component Value Date   HGBA1C 5.4 03/18/2023   HGBA1C 5.1 12/20/2022   HGBA1C 5.5 09/09/2022   INSULIN 27.9 (H) 09/20/2021  - She has been compliant with Metformin 500 mg BID and Mounjaro 12.5 mg weekly. - She started the Mounjaro 12.5 mg weekly last Friday. Denies any other GI side effects apart from feeling slightly constipated, which she attributes to not drinking enough water.   Plan: - Continue with meds. Will refill Mounjaro 12.5 mg today.    - Continue to decrease simple carbs/ sugars; increase fiber and proteins -> follow her meal plan.   Dawn Downs will continue to work on weight loss, exercise, via their meal plan we devised to help decrease the risk of progressing to diabetes.    Mood disorder (HCC) with emotional eating Assessment: Condition is Not optimized. Denies any SI/HI. Mood is stable. Patient endorses she has been under a Downs of stress because she is looking for a new job and due to plumbing issues at home. Patient is compliant with Wellbutrin 300 mg as recommended by Dr.Tabori. Denies any side effects.   Plan:  -  Continue with med as recommended by Dr.Tabori.  - I also encouraged her to follow up with Dr.Tabori regarding medication management to better manage her mood/stress.   - I also recommended patient to walk outdoors and  trying meditation for stress management.    TREATMENT PLAN FOR OBESITY: BMI 40.0-44.9, adult (HCC)-CURRENT 34.9 Morbid obesity: Starting BMI 44.2/DATE 09/20/21 Assessment: Condition is not optimized. Biometric data collected today, was reviewed with patient.  Fat mass has increased by 6.4 lb. Muscle mass has decreased by 3.6 lb. Total body water has increased by 4.6 lb.   Plan:  Dawn Downs is currently in the action stage of change. As such, her goal is to continue weight management plan. Dawn Downs will work on [employer] habits and continue with keeping a food journal and adhering to recommended goals of 1750-1850 calories and 110+ protein.  Behavioral Intervention Additional resources provided today: patient declined Evidence-based interventions for health behavior change were utilized today including the discussion of self monitoring techniques, problem-solving barriers and SMART goal setting techniques.   Regarding patient's less desirable eating habits and patterns, we employed the technique of small changes.  Pt will specifically work on: continue prudent nutritional plan for next visit.    Recommended Physical Activity Goals Dawn Downs has been advised to work up to 150 minutes of moderate intensity aerobic activity a week and strengthening exercises 2-3 times per week for cardiovascular health, weight loss maintenance and preservation of muscle mass.  She has agreed to Continue current level of physical activity   FOLLOW UP: Return in about 3 weeks (around 05/15/2023). She was informed of the importance of frequent  follow up visits to maximize her success with intensive lifestyle modifications for her multiple health conditions.  Subjective:   Chief complaint: Obesity Dawn Downs is here to discuss her progress with her obesity treatment plan. She is on the keeping a food journal and adhering to recommended goals of 1750-1850 calories and 110+ protein and states she is following her  eating plan approximately 60-70% of the time. She states she is not exercising.   Interval History:  Dawn Downs is here for a follow up office visit.  Since last office visit: - She has been stressed because she is looking for a new full time job and plumbing issues at her home.  - Because of this stress, she has been eating outside the home more.   Pharmacotherapy for weight loss: She is currently taking  Mounjaro  for medical weight loss.  Denies side effects.    Review of Systems:  Pertinent positives were addressed with patient today.   Weight Summary and Biometrics   Weight Lost Since Last Visit: 0  Weight Gained Since Last Visit: 3 lb    Vitals Temp: (!) 97.5 F (36.4 C) BP: 113/66 Pulse Rate: 72 SpO2: 99 %   Anthropometric Measurements Height: 5\' 4"  (1.626 m) Weight: 203 lb 6.4 oz (92.3 kg) BMI (Calculated): 34.9 Weight at Last Visit: 200 lb Weight Lost Since Last Visit: 0 Weight Gained Since Last Visit: 3 lb Starting Weight: 258 lb Total Weight Loss (lbs): 55 lb (24.9 kg) Peak Weight: 281.6 lb   Body Composition  Body Fat %: 43.1 % Fat Mass (lbs): 87.6 lbs Muscle Mass (lbs): 110 lbs Total Body Water (lbs): 83 lbs Visceral Fat Rating : 11   Other Clinical Data Fasting: No Labs: No Today's Visit #: 28 Starting Date: 09/20/21     Objective:   PHYSICAL EXAM: Blood pressure 113/66, pulse 72, temperature (!) 97.5 F (36.4 C), height 5\' 4"  (1.626 m), weight 203 lb 6.4 oz (92.3 kg), SpO2 99 %. Body mass index is 34.91 kg/m.  General: Well Developed, well nourished, and in no acute distress.  HEENT: Normocephalic, atraumatic Skin: Warm and dry, cap RF less 2 sec, good turgor Chest:  Normal excursion, shape, no gross abn Respiratory: speaking in full sentences, no conversational dyspnea NeuroM-Sk: Ambulates w/o assistance, moves * 4 Psych: A and O *3, insight good, mood-full  DIAGNOSTIC DATA REVIEWED:  BMET    Component Value  Date/Time   NA 137 03/18/2023 0804   NA 142 01/23/2023 0936   K 4.3 03/18/2023 0804   CL 104 03/18/2023 0804   CO2 23 03/18/2023 0804   GLUCOSE 81 03/18/2023 0804   BUN 22 03/18/2023 0804   BUN 12 01/23/2023 0936   CREATININE 0.63 03/18/2023 0804   CREATININE 0.53 12/21/2020 1043   CALCIUM 9.8 03/18/2023 0804   GFRNONAA >60 11/10/2009 1045   GFRAA  11/10/2009 1045    >60        The eGFR has been calculated using the MDRD equation. This calculation has not been validated in all clinical situations. eGFR's persistently <60 mL/min signify possible Chronic Kidney Disease.   Lab Results  Component Value Date   HGBA1C 5.4 03/18/2023   HGBA1C  05/31/2009    5.1 (NOTE) The ADA recommends the following therapeutic goal for glycemic control related to Hgb A1c measurement: Goal of therapy: <6.5 Hgb A1c  Reference: American Diabetes Association: Clinical Practice Recommendations 2010, Diabetes Care, 2010, 33: (Suppl  1).   Lab Results  Component  Value Date   INSULIN 27.9 (H) 09/20/2021   Lab Results  Component Value Date   TSH 1.77 12/20/2022   CBC    Component Value Date/Time   WBC 10.9 (H) 12/20/2022 0848   RBC 4.90 12/20/2022 0848   HGB 12.6 12/20/2022 0848   HCT 37.8 12/20/2022 0848   PLT 353.0 12/20/2022 0848   MCV 77.1 (L) 12/20/2022 0848   MCH 26.5 (L) 12/21/2020 1043   MCHC 33.4 12/20/2022 0848   RDW 15.9 (H) 12/20/2022 0848   Iron Studies No results found for: "IRON", "TIBC", "FERRITIN", "IRONPCTSAT" Lipid Panel     Component Value Date/Time   CHOL 173 12/20/2022 0848   TRIG 83.0 12/20/2022 0848   HDL 33.20 (L) 12/20/2022 0848   CHOLHDL 5 12/20/2022 0848   VLDL 16.6 12/20/2022 0848   LDLCALC 123 (H) 12/20/2022 0848   LDLCALC 127 (H) 12/21/2020 1043   Hepatic Function Panel     Component Value Date/Time   PROT 6.8 01/23/2023 0936   ALBUMIN 4.7 01/23/2023 0936   AST 10 01/23/2023 0936   ALT 9 01/23/2023 0936   ALKPHOS 68 01/23/2023 0936   BILITOT  0.3 01/23/2023 0936   BILIDIR 0.0 12/20/2022 0848   IBILI 0.3 12/21/2020 1043      Component Value Date/Time   TSH 1.77 12/20/2022 0848   Nutritional Lab Results  Component Value Date   VD25OH 91.2 01/23/2023   VD25OH 109.84 (HH) 12/20/2022   VD25OH CANCELED 10/03/2022    Attestations:   Reviewed by clinician on day of visit: allergies, medications, problem list, medical history, surgical history, family history, social history, and previous encounter notes.   I,Dawn Downs,acting as a Neurosurgeon for [employer], DO.,have documented all relevant documentation on the behalf of Dawn Lot, DO,as directed by  Dawn Lot, DO while in the presence of Dawn Lot, DO.   I, Dawn Lot, DO, have reviewed all documentation for this visit. The documentation on 04/24/23 for the exam, diagnosis, procedures, and orders are all accurate and complete.

## 2023-05-14 ENCOUNTER — Ambulatory Visit (INDEPENDENT_AMBULATORY_CARE_PROVIDER_SITE_OTHER): Payer: Commercial Managed Care - PPO | Admitting: Family Medicine

## 2023-05-14 ENCOUNTER — Other Ambulatory Visit (HOSPITAL_COMMUNITY): Payer: Self-pay

## 2023-05-14 ENCOUNTER — Encounter (INDEPENDENT_AMBULATORY_CARE_PROVIDER_SITE_OTHER): Payer: Self-pay | Admitting: Family Medicine

## 2023-05-14 VITALS — BP 142/84 | HR 93 | Temp 98.0°F | Ht 64.0 in | Wt 199.0 lb

## 2023-05-14 DIAGNOSIS — E1169 Type 2 diabetes mellitus with other specified complication: Secondary | ICD-10-CM

## 2023-05-14 DIAGNOSIS — Z7984 Long term (current) use of oral hypoglycemic drugs: Secondary | ICD-10-CM

## 2023-05-14 DIAGNOSIS — Z7985 Long-term (current) use of injectable non-insulin antidiabetic drugs: Secondary | ICD-10-CM

## 2023-05-14 DIAGNOSIS — Z6834 Body mass index (BMI) 34.0-34.9, adult: Secondary | ICD-10-CM

## 2023-05-14 DIAGNOSIS — Z6841 Body Mass Index (BMI) 40.0 and over, adult: Secondary | ICD-10-CM

## 2023-05-14 DIAGNOSIS — K5900 Constipation, unspecified: Secondary | ICD-10-CM | POA: Diagnosis not present

## 2023-05-14 DIAGNOSIS — F39 Unspecified mood [affective] disorder: Secondary | ICD-10-CM | POA: Diagnosis not present

## 2023-05-14 MED ORDER — TIRZEPATIDE 12.5 MG/0.5ML ~~LOC~~ SOAJ
12.5000 mg | SUBCUTANEOUS | 0 refills | Status: DC
  Filled 2023-05-14: qty 2, 28d supply, fill #0

## 2023-05-14 NOTE — Progress Notes (Signed)
[name], D.O.  ABFM, ABOM Specializing in Clinical Bariatric Medicine  Office located at: [address] [place], [place]  [address]     Assessment and Plan:   No orders of the defined types were placed in this encounter.   Medications Discontinued During This Encounter  Medication Reason   tirzepatide [facility]) 12.5 MG/0.5ML Pen Reorder     Meds ordered this encounter  Medications   tirzepatide (MOUNJARO) 12.5 MG/0.5ML Pen    Sig: Inject 12.5 mg into the skin once a week.    Dispense:  2 mL    Refill:  0     Mood disorder (HCC) with emotional eating Assessment: Condition is stable.  - Denies any SI/HI. Mood is stable. - She still has stress from work. However, the stress has improved since last OV.  - No issues with Wellbutrin 300 mg per her PCP. Denies any side effects.  Plan:  - Continue with med as prescribed by PCP.  - We will continue to monitor closely alongside PCP / other specialists.    Type 2 diabetes mellitus with obesity (HCC) Assessment: Condition is not optimized. Lab Results  Component Value Date   HGBA1C 5.4 03/18/2023   HGBA1C 5.1 12/20/2022   HGBA1C 5.5 09/09/2022   INSULIN 27.9 (H) 09/20/2021  - Good compliance and tolerance with Metformin 500 mg BID and Mounjaro 12.5 mg weekly.  - Her hunger and cravings are controlled.  - Reports not having any symptoms of low/high blood sugars.   Plan: - Continue with all meds as prescribed. Will refill Mounjaro today.   -Continue her prudent nutritional plan that is low in simple carbohydrates, saturated fats and trans fats to goal of 5-10% weight loss to achieve significant health benefits.  Pt encouraged to continually advance exercise and cardiovascular fitness as tolerated throughout weight loss journey.     Constipation, unspecified constipation type Assessment: Condition is stable. - Patient states that her constipation is under well control. - She endorses that she is properly  hydrated and has been walking more throughout the day.  - She takes Miralax prn, overall tolerance to Miralax is good.   Plan: - Good Bowel Health: Your goal is to have one soft bowel movement each day. Drink at least half of your weight in ounces of water per day unless otherwise noted by one of your doctors that you must restrict water intake.  Eat plenty of fiber (goal is over 25 grams each day).  It is best to get most of your fiber from dietary sources which includes leafy green vegetables, fresh fruit, and whole grains.  - Will continue to monitor symptoms as it relates to her weight loss journey.    TREATMENT PLAN FOR OBESITY: BMI 40.0-44.9, adult (HCC)-CURRENT 34.9 Morbid obesity: Starting BMI 44.2/DATE 09/20/21 Assessment:  VALIA THURNER is here to discuss her progress with her obesity treatment plan along with follow-up of her obesity related diagnoses. See Medical Weight Management Flowsheet for complete bioelectrical impedance results.  Condition is improving, but not optimized. Biometric data collected today, was reviewed with patient.   Since last office visit on 04/24/23 patient's Fat mass has decreased by 8 lb. Muscle mass has increased by 3.8 lb. Total body water has decreased by 6 lb.  Counseling done on how various foods will affect these numbers and how to maximize success  Total lbs lost to date: 59 Total weight loss percentage to date: 22.87   Plan:  Jashya is currently  in the action stage of change. As such, her goal is to continue her weight management plan. Crista will work on [employer] habits and continue with keeping a food journal and adhering to recommended goals of 1750-1850 calories and 110+ protein and using the Category 3 meal plan as a guide.   Behavioral Intervention Additional resources provided today: patient declined Evidence-based interventions for health behavior change were utilized today including the discussion of self monitoring  techniques, problem-solving barriers and SMART goal setting techniques.   Regarding patient's less desirable eating habits and patterns, we employed the technique of small changes.  Pt will specifically work on: continue with self care and adherence to meal plan  for next visit.    Recommended Physical Activity Goals  Kelsea has been advised to slowly work up to 150 minutes of moderate intensity aerobic activity a week and strengthening exercises 2-3 times per week for cardiovascular health, weight loss maintenance and preservation of muscle mass.   She has agreed to Continue current level of physical activity   FOLLOW UP: Return in about 3 weeks (around 06/04/2023). She was informed of the importance of frequent follow up visits to maximize her success with intensive lifestyle modifications for her multiple health conditions.  Subjective:   Chief complaint: Obesity Kenzlie is here to discuss her progress with her obesity treatment plan. She is on the keeping a food journal and adhering to recommended goals of 1750-1850 calories and 110+ protein and using the Category 3 meal plan as guide and states she is following her eating plan approximately 60% of the time. She states she is not exercising.   Interval History:  CYSTAL GAVAN is here for a follow up office visit.   Since last office visit:   - She has been focusing on increasing her  protein and water intake.  - Overall, she has been working on mindfulness and making healthier choices.   - For stress management, she walks  at work.  - She is sleeping ~5 hrs per night and reports that she functions well with this amount of sleep.   Pharmacotherapy for weight loss: She is currently taking  Mounjaro and Metformin  for medical weight loss. Denies side effects   Review of Systems:  Pertinent positives were addressed with patient today.  Weight Summary and Biometrics   Weight Lost Since Last Visit: 4 lb  Weight Gained Since  Last Visit: 0    Vitals Temp: 98 F (36.7 C) BP: (!) 142/84 Pulse Rate: 93 SpO2: 98 %   Anthropometric Measurements Height: 5\' 4"  (1.626 m) Weight: 199 lb (90.3 kg) BMI (Calculated): 34.14 Weight at Last Visit: 203 lb Weight Lost Since Last Visit: 4 lb Weight Gained Since Last Visit: 0 Starting Weight: 258 lb Total Weight Loss (lbs): 29 lb (13.2 kg) Peak Weight: 281.6 lb   Body Composition  Body Fat %: 39.9 % Fat Mass (lbs): 79.6 lbs Muscle Mass (lbs): 113.8 lbs Total Body Water (lbs): 77 lbs Visceral Fat Rating : 10   No data recorded  Objective:   PHYSICAL EXAM: Blood pressure (!) 142/84, pulse 93, temperature 98 F (36.7 C), height 5\' 4"  (1.626 m), weight 199 lb (90.3 kg), SpO2 98 %. Body mass index is 34.16 kg/m.  General: Well Developed, well nourished, and in no acute distress.  HEENT: Normocephalic, atraumatic Skin: Warm and dry, cap RF less 2 sec, good turgor Chest:  Normal excursion, shape, no gross abn Respiratory: speaking in full sentences, no conversational  dyspnea NeuroM-Sk: Ambulates w/o assistance, moves * 4 Psych: A and O *3, insight good, mood-full  DIAGNOSTIC DATA REVIEWED:  BMET    Component Value Date/Time   NA 137 03/18/2023 0804   NA 142 01/23/2023 0936   K 4.3 03/18/2023 0804   CL 104 03/18/2023 0804   CO2 23 03/18/2023 0804   GLUCOSE 81 03/18/2023 0804   BUN 22 03/18/2023 0804   BUN 12 01/23/2023 0936   CREATININE 0.63 03/18/2023 0804   CREATININE 0.53 12/21/2020 1043   CALCIUM 9.8 03/18/2023 0804   GFRNONAA >60 11/10/2009 1045   GFRAA  11/10/2009 1045    >60        The eGFR has been calculated using the MDRD equation. This calculation has not been validated in all clinical situations. eGFR's persistently <60 mL/min signify possible Chronic Kidney Disease.   Lab Results  Component Value Date   HGBA1C 5.4 03/18/2023   HGBA1C  05/31/2009    5.1 (NOTE) The ADA recommends the following therapeutic goal for glycemic  control related to Hgb A1c measurement: Goal of therapy: <6.5 Hgb A1c  Reference: American Diabetes Association: Clinical Practice Recommendations 2010, Diabetes Care, 2010, 33: (Suppl  1).   Lab Results  Component Value Date   INSULIN 27.9 (H) 09/20/2021   Lab Results  Component Value Date   TSH 1.77 12/20/2022   CBC    Component Value Date/Time   WBC 10.9 (H) 12/20/2022 0848   RBC 4.90 12/20/2022 0848   HGB 12.6 12/20/2022 0848   HCT 37.8 12/20/2022 0848   PLT 353.0 12/20/2022 0848   MCV 77.1 (L) 12/20/2022 0848   MCH 26.5 (L) 12/21/2020 1043   MCHC 33.4 12/20/2022 0848   RDW 15.9 (H) 12/20/2022 0848   Iron Studies No results found for: "IRON", "TIBC", "FERRITIN", "IRONPCTSAT" Lipid Panel     Component Value Date/Time   CHOL 173 12/20/2022 0848   TRIG 83.0 12/20/2022 0848   HDL 33.20 (L) 12/20/2022 0848   CHOLHDL 5 12/20/2022 0848   VLDL 16.6 12/20/2022 0848   LDLCALC 123 (H) 12/20/2022 0848   LDLCALC 127 (H) 12/21/2020 1043   Hepatic Function Panel     Component Value Date/Time   PROT 6.8 01/23/2023 0936   ALBUMIN 4.7 01/23/2023 0936   AST 10 01/23/2023 0936   ALT 9 01/23/2023 0936   ALKPHOS 68 01/23/2023 0936   BILITOT 0.3 01/23/2023 0936   BILIDIR 0.0 12/20/2022 0848   IBILI 0.3 12/21/2020 1043      Component Value Date/Time   TSH 1.77 12/20/2022 0848   Nutritional Lab Results  Component Value Date   VD25OH 91.2 01/23/2023   VD25OH 109.84 (HH) 12/20/2022   VD25OH CANCELED 10/03/2022    Attestations:   Reviewed by clinician on day of visit: allergies, medications, problem list, medical history, surgical history, family history, social history, and previous encounter notes.    I,Special Puri,acting as a Neurosurgeon for [employer], DO.,have documented all relevant documentation on the behalf of Thomasene Lot, DO,as directed by  Thomasene Lot, DO while in the presence of Thomasene Lot, DO.   I, Thomasene Lot, DO, have reviewed all  documentation for this visit. The documentation on 05/14/23 for the exam, diagnosis, procedures, and orders are all accurate and complete.

## 2023-05-23 ENCOUNTER — Other Ambulatory Visit (HOSPITAL_COMMUNITY): Payer: Self-pay

## 2023-06-05 ENCOUNTER — Encounter (INDEPENDENT_AMBULATORY_CARE_PROVIDER_SITE_OTHER): Payer: Self-pay | Admitting: Family Medicine

## 2023-06-05 ENCOUNTER — Ambulatory Visit (INDEPENDENT_AMBULATORY_CARE_PROVIDER_SITE_OTHER): Payer: Commercial Managed Care - PPO | Admitting: Family Medicine

## 2023-06-05 ENCOUNTER — Other Ambulatory Visit (HOSPITAL_COMMUNITY): Payer: Self-pay

## 2023-06-05 VITALS — BP 129/88 | HR 80 | Temp 97.7°F | Ht 64.0 in | Wt 198.0 lb

## 2023-06-05 DIAGNOSIS — F39 Unspecified mood [affective] disorder: Secondary | ICD-10-CM

## 2023-06-05 DIAGNOSIS — Z6833 Body mass index (BMI) 33.0-33.9, adult: Secondary | ICD-10-CM

## 2023-06-05 DIAGNOSIS — E1169 Type 2 diabetes mellitus with other specified complication: Secondary | ICD-10-CM

## 2023-06-05 DIAGNOSIS — Z7985 Long-term (current) use of injectable non-insulin antidiabetic drugs: Secondary | ICD-10-CM

## 2023-06-05 DIAGNOSIS — Z7984 Long term (current) use of oral hypoglycemic drugs: Secondary | ICD-10-CM

## 2023-06-05 DIAGNOSIS — E669 Obesity, unspecified: Secondary | ICD-10-CM

## 2023-06-05 DIAGNOSIS — Z6841 Body Mass Index (BMI) 40.0 and over, adult: Secondary | ICD-10-CM

## 2023-06-05 MED ORDER — TIRZEPATIDE 12.5 MG/0.5ML ~~LOC~~ SOAJ
12.5000 mg | SUBCUTANEOUS | 0 refills | Status: DC
  Filled 2023-06-05 – 2023-06-23 (×2): qty 2, 28d supply, fill #0

## 2023-06-05 NOTE — Progress Notes (Signed)
Dawn Downs, D.O.  ABFM, ABOM Specializing in Clinical Bariatric Medicine  Office located at: [address] [place], [place]  [address]     Assessment and Plan:   Medications Discontinued During This Encounter  Medication Reason   tirzepatide [facility]) 12.5 MG/0.5ML Pen Reorder     Meds ordered this encounter  Medications   tirzepatide (MOUNJARO) 12.5 MG/0.5ML Pen    Sig: Inject 12.5 mg into the skin once a week.    Dispense:  2 mL    Refill:  0     Mood disorder (HCC) with emotional eating Assessment: Condition is improving.  - Denies any SI/HI. Mood is stable. Dawn Downs endorses beginning her new job yesterday. Overall, she feels much happier with her new job. - For self care, she does activities like watching TV and tanning.  - She has been compliant with Wellbutrin XL 300 mg daily. Denies any adverse effects.  Plan:  - Continue with mood medication as prescribed by her PCP.  - Continue her prudent nutritional plan that is low in simple carbohydrates, saturated fats and trans fats to goal of 5-10% weight loss to achieve significant health benefits.  Pt encouraged to continually advance exercise and cardiovascular fitness as tolerated throughout weight loss journey.  - We will continue to monitor closely alongside PCP / other specialists.    Type 2 diabetes mellitus with obesity (HCC) Assessment: Condition is not optimized.  Lab Results  Component Value Date   HGBA1C 5.4 03/18/2023   HGBA1C 5.1 12/20/2022   HGBA1C 5.5 09/09/2022   INSULIN 27.9 (H) 09/20/2021  - No issues with Metformin 500 mg BID and Mounjaro 12.5 mg weekly. Denies any N/V/D.  - Hunger and cravings are controlled on anti-diabetic medications.   Plan: - Continue with both medications. Will refill Mounjaro today.    - Continue decreasing simple carbs and increasing proteins. - Will continue to monitor condition closely alongside Dr.Tabori   TREATMENT PLAN FOR OBESITY: BMI  40.0-44.9, adult (HCC)-CURRENT 33.97 Morbid obesity: Starting BMI 44.2/DATE 09/20/21 Assessment:  Dawn Downs is here to discuss her progress with her obesity treatment plan along with follow-up of her obesity related diagnoses. See Medical Weight Management Flowsheet for complete bioelectrical impedance results.  Condition is not optimized. Biometric data collected today, was reviewed with patient.   Since last office visit on 05/14/23 patient's  Muscle mass has decreased by 1.6 lb. Fat mass has increased by 1.8 lb. Total body water has increased by 0.8 lb.  Counseling done on how various foods will affect these numbers and how to maximize success  Total lbs lost to date: 60 Total weight loss percentage to date: 23.26   Plan:  - Continue with the Category 3 meal plan with breakfast and lunch options  Behavioral Intervention Additional resources provided today: patient declined Evidence-based interventions for health behavior change were utilized today including the discussion of self monitoring techniques, problem-solving barriers and SMART goal setting techniques.   Regarding patient's less desirable eating habits and patterns, we employed the technique of small changes.  Pt will specifically work on: thinking about ways to increase exercise for next visit.    Recommended Physical Activity Goals  Delbert has been advised to slowly work up to 150 minutes of moderate intensity aerobic activity a week and strengthening exercises 2-3 times per week for cardiovascular health, weight loss maintenance and preservation of muscle mass.   She has agreed to Think about ways to increase daily physical activity  and overcoming barriers to exercise   FOLLOW UP: Return in about 3 weeks (around 06/26/2023). She was informed of the importance of frequent follow up visits to maximize her success with intensive lifestyle modifications for her multiple health conditions.   Subjective:   Chief  complaint: Obesity [name] is here to discuss her progress with her obesity treatment plan. She is on the Category 3 Plan with breakfast and lunch options and states she is following her eating plan approximately 60% of the time. She states she is not exercising  Interval History:  Dawn Downs is here for a follow up office visit.     Since last office visit:   - Started her new job yesterday and feels more content. - For self care/relaxation, she does tanning and watching reality TV.  - She has been mindful of her protein intake. - Her hunger and cravings are well controlled on the Metformin and Mounjaro.  - Her constipation has been under good control. Patient attributes this to proper hydration and her consistent use of Magnesium supplements.   Pharmacotherapy for weight loss: She is currently taking  Metformin and Mounjaro  for medical weight loss.  Denies side effects.    Review of Systems:  Pertinent positives were addressed with patient today.  Weight Summary and Biometrics   Weight Lost Since Last Visit: 1lb  Weight Gained Since Last Visit: 0lb    Vitals Temp: 97.7 F (36.5 C) BP: 129/88 Pulse Rate: 80 SpO2: 98 %   Anthropometric Measurements Height: 5\' 4"  (1.626 m) Weight: 198 lb (89.8 kg) BMI (Calculated): 33.97 Weight at Last Visit: 199lb Weight Lost Since Last Visit: 1lb Weight Gained Since Last Visit: 0lb Starting Weight: 258lb Total Weight Loss (lbs): 60 lb (27.2 kg) Peak Weight: 281.6lb   Body Composition  Body Fat %: 41 % Fat Mass (lbs): 81.4 lbs Muscle Mass (lbs): 111.2 lbs Total Body Water (lbs): 77.8 lbs Visceral Fat Rating : 10   Other Clinical Data Fasting: Yes Labs: No Today's Visit #: 29 Starting Date: 09/20/21    Objective:   PHYSICAL EXAM: Blood pressure 129/88, pulse 80, temperature 97.7 F (36.5 C), height 5\' 4"  (1.626 m), weight 198 lb (89.8 kg), SpO2 98 %. Body mass index is 33.99 kg/m.  General: Well Developed,  well nourished, and in no acute distress.  HEENT: Normocephalic, atraumatic Skin: Warm and dry, cap RF less 2 sec, good turgor Chest:  Normal excursion, shape, no gross abn Respiratory: speaking in full sentences, no conversational dyspnea NeuroM-Sk: Ambulates w/o assistance, moves * 4 Psych: A and O *3, insight good, mood-full  DIAGNOSTIC DATA REVIEWED:  BMET    Component Value Date/Time   NA 137 03/18/2023 0804   NA 142 01/23/2023 0936   K 4.3 03/18/2023 0804   CL 104 03/18/2023 0804   CO2 23 03/18/2023 0804   GLUCOSE 81 03/18/2023 0804   BUN 22 03/18/2023 0804   BUN 12 01/23/2023 0936   CREATININE 0.63 03/18/2023 0804   CREATININE 0.53 12/21/2020 1043   CALCIUM 9.8 03/18/2023 0804   GFRNONAA >60 11/10/2009 1045   GFRAA  11/10/2009 1045    >60        The eGFR has been calculated using the MDRD equation. This calculation has not been validated in all clinical situations. eGFR's persistently <60 mL/min signify possible Chronic Kidney Disease.   Lab Results  Component Value Date   HGBA1C 5.4 03/18/2023   HGBA1C  05/31/2009    5.1 (NOTE) The  ADA recommends the following therapeutic goal for glycemic control related to Hgb A1c measurement: Goal of therapy: <6.5 Hgb A1c  Reference: American Diabetes Association: Clinical Practice Recommendations 2010, Diabetes Care, 2010, 33: (Suppl  1).   Lab Results  Component Value Date   INSULIN 27.9 (H) 09/20/2021   Lab Results  Component Value Date   TSH 1.77 12/20/2022   CBC    Component Value Date/Time   WBC 10.9 (H) 12/20/2022 0848   RBC 4.90 12/20/2022 0848   HGB 12.6 12/20/2022 0848   HCT 37.8 12/20/2022 0848   PLT 353.0 12/20/2022 0848   MCV 77.1 (L) 12/20/2022 0848   MCH 26.5 (L) 12/21/2020 1043   MCHC 33.4 12/20/2022 0848   RDW 15.9 (H) 12/20/2022 0848   Iron Studies No results found for: "IRON", "TIBC", "FERRITIN", "IRONPCTSAT" Lipid Panel     Component Value Date/Time   CHOL 173 12/20/2022 0848   TRIG  83.0 12/20/2022 0848   HDL 33.20 (L) 12/20/2022 0848   CHOLHDL 5 12/20/2022 0848   VLDL 16.6 12/20/2022 0848   LDLCALC 123 (H) 12/20/2022 0848   LDLCALC 127 (H) 12/21/2020 1043   Hepatic Function Panel     Component Value Date/Time   PROT 6.8 01/23/2023 0936   ALBUMIN 4.7 01/23/2023 0936   AST 10 01/23/2023 0936   ALT 9 01/23/2023 0936   ALKPHOS 68 01/23/2023 0936   BILITOT 0.3 01/23/2023 0936   BILIDIR 0.0 12/20/2022 0848   IBILI 0.3 12/21/2020 1043      Component Value Date/Time   TSH 1.77 12/20/2022 0848   Nutritional Lab Results  Component Value Date   VD25OH 91.2 01/23/2023   VD25OH 109.84 (HH) 12/20/2022   VD25OH CANCELED 10/03/2022    Attestations:   Reviewed by clinician on day of visit: allergies, medications, problem list, medical history, surgical history, family history, social history, and previous encounter notes.  I,Special Puri,acting as a Neurosurgeon for [employer], DO.,have documented all relevant documentation on the behalf of Thomasene Lot, DO,as directed by  Thomasene Lot, DO while in the presence of Thomasene Lot, DO.   I, Thomasene Lot, DO, have reviewed all documentation for this visit. The documentation on 06/05/23 for the exam, diagnosis, procedures, and orders are all accurate and complete.

## 2023-06-23 ENCOUNTER — Other Ambulatory Visit (HOSPITAL_COMMUNITY): Payer: Self-pay

## 2023-06-25 ENCOUNTER — Ambulatory Visit (INDEPENDENT_AMBULATORY_CARE_PROVIDER_SITE_OTHER): Payer: Commercial Managed Care - PPO | Admitting: Family Medicine

## 2023-06-25 ENCOUNTER — Encounter (INDEPENDENT_AMBULATORY_CARE_PROVIDER_SITE_OTHER): Payer: Self-pay | Admitting: Family Medicine

## 2023-06-25 ENCOUNTER — Other Ambulatory Visit: Payer: Self-pay

## 2023-06-25 ENCOUNTER — Other Ambulatory Visit (HOSPITAL_COMMUNITY): Payer: Self-pay

## 2023-06-25 VITALS — BP 131/86 | HR 69 | Temp 98.2°F | Ht 64.0 in | Wt 201.0 lb

## 2023-06-25 DIAGNOSIS — F39 Unspecified mood [affective] disorder: Secondary | ICD-10-CM | POA: Diagnosis not present

## 2023-06-25 DIAGNOSIS — Z6834 Body mass index (BMI) 34.0-34.9, adult: Secondary | ICD-10-CM | POA: Diagnosis not present

## 2023-06-25 DIAGNOSIS — E1169 Type 2 diabetes mellitus with other specified complication: Secondary | ICD-10-CM

## 2023-06-25 DIAGNOSIS — Z7984 Long term (current) use of oral hypoglycemic drugs: Secondary | ICD-10-CM

## 2023-06-25 DIAGNOSIS — Z7985 Long-term (current) use of injectable non-insulin antidiabetic drugs: Secondary | ICD-10-CM

## 2023-06-25 MED ORDER — TIRZEPATIDE 12.5 MG/0.5ML ~~LOC~~ SOAJ
12.5000 mg | SUBCUTANEOUS | 0 refills | Status: DC
Start: 2023-06-25 — End: 2023-12-03
  Filled 2023-06-27: qty 2, 28d supply, fill #0

## 2023-06-25 NOTE — Assessment & Plan Note (Signed)
Worsened lately due to starting a new job and packing up her house in preparation for move to an apartment mid July.  This has lead to some less healthy food choices, skipping lunches and less meal planning.  In the long run, she will not need to work 2 jobs, allowing more time for herself and she will have space and time to walk and prepare meals.  Doing well on Wellbutrin XL 300 mg daily per PCP.  Continue current medications Continue to work on proper sleep, easy healthy food choices, taking time for meals/ self care.

## 2023-06-25 NOTE — Assessment & Plan Note (Signed)
Lab Results  Component Value Date   HGBA1C 5.4 03/18/2023   Doing well with blood glucose control on Mounjaro 12.5 mg weekly + Metformin XR 500 mg bid.  Denies GI SE.  Enjoying the improved satiety and reduction in food impulse control on Mounjaro.  Denies feelings of hypoglycemia.  Not checking blood sugars.  Plans to increase walking time.  Working on a low sugar diet.

## 2023-06-25 NOTE — Progress Notes (Signed)
Office: [phone]  /  Fax: [phone]  WEIGHT SUMMARY AND BIOMETRICS  Starting Date: 09/20/21  Starting Weight: 258lb   Weight Lost Since Last Visit: 0lb   Vitals Temp: 98.2 F (36.8 C) BP: 131/86 Pulse Rate: 69 SpO2: 98 %   Body Composition  Body Fat %: 40.7 % Fat Mass (lbs): 81.8 lbs Muscle Mass (lbs): 113.4 lbs Total Body Water (lbs): 80 lbs Visceral Fat Rating : 10     HPI  Chief Complaint: OBESITY  Jamison is here to discuss her progress with her obesity treatment plan. She is on the the Category 3 Plan and states she is following her eating plan approximately 60 % of the time. She states she is moving.    Interval History:  Since last office visit she is is up 3 lb She is moving nearby and started a new job Has struggled more with stress eating She is doing well on Mounjaro 12.5 mg weekly and Metformin 500 mg bid She is not checking her blood sugars She will have an area to walk in her new apartment complex She did go up 2.2 lb of muscle mass and is up 0.4 lb of body fat in the past 3 weeks She is good about eating breakfast, has occasionally skipped lunch due to work stress and plans to work on bringing lunch more days of the week  Her net weight loss is 57 lb in the past 21 mos of medically supervised weight management This is a 22% TBW loss  Pharmacotherapy: Mounjaro 12.5 mg weekly  PHYSICAL EXAM:  Blood pressure 131/86, pulse 69, temperature 98.2 F (36.8 C), height 5\' 4"  (1.626 m), weight 201 lb (91.2 kg), SpO2 98 %. Body mass index is 34.5 kg/m.  General: She is overweight, cooperative, alert, well developed, and in no acute distress. PSYCH: Has normal mood, affect and thought process.   Lungs: Normal breathing effort, no conversational dyspnea.   ASSESSMENT AND PLAN  TREATMENT PLAN FOR OBESITY:  Recommended Dietary Goals  Carola is currently in the action stage of change. As such, her goal is to continue weight management  plan. She has agreed to the Category 3 Plan.  Behavioral Intervention  We discussed the following Behavioral Modification Strategies today: increasing lean protein intake, decreasing simple carbohydrates , increasing vegetables, increasing lower glycemic fruits, increasing water intake, work on meal planning and preparation, keeping healthy foods at home, continue to practice mindfulness when eating, and planning for success.  Additional resources provided today: NA  Recommended Physical Activity Goals  [place] has been advised to work up to 150 minutes of moderate intensity aerobic activity a week and strengthening exercises 2-3 times per week for cardiovascular health, weight loss maintenance and preservation of muscle mass.   She has agreed to Start aerobic activity with a goal of 150 minutes a week at moderate intensity.   Pharmacotherapy changes for the treatment of obesity: none  ASSOCIATED CONDITIONS ADDRESSED TODAY  Type 2 diabetes mellitus with obesity (HCC) Assessment & Plan: Lab Results  Component Value Date   HGBA1C 5.4 03/18/2023   Doing well with blood glucose control on Mounjaro 12.5 mg weekly + Metformin XR 500 mg bid.  Denies GI SE.  Enjoying the improved satiety and reduction in food impulse control on Mounjaro.  Denies feelings of hypoglycemia.  Not checking blood sugars.  Plans to increase walking time.  Working on a low sugar diet.  Orders: -     Tirzepatide; Inject 12.5 mg into the  skin once a week.  Dispense: 2 mL; Refill: 0  Morbid obesity: Starting BMI 44.2/DATE 09/20/21  BMI 34.0-34.9,adult  Mood disorder (HCC) with emotional eating Assessment & Plan: Worsened lately due to starting a new job and packing up her house in preparation for move to an apartment mid July.  This has lead to some less healthy food choices, skipping lunches and less meal planning.  In the long run, she will not need to work 2 jobs, allowing more time for herself and she will have  space and time to walk and prepare meals.  Doing well on Wellbutrin XL 300 mg daily per PCP.  Continue current medications Continue to work on proper sleep, easy healthy food choices, taking time for meals/ self care.       She was informed of the importance of frequent follow up visits to maximize her success with intensive lifestyle modifications for her multiple health conditions.   ATTESTASTION STATEMENTS:  Reviewed by clinician on day of visit: allergies, medications, problem list, medical history, surgical history, family history, social history, and previous encounter notes pertinent to obesity diagnosis.   I have personally spent 30 minutes total time today in preparation, patient care, nutritional counseling and documentation for this visit, including the following: review of clinical lab tests; review of medical tests/procedures/services.      Glennis Brink, DO DABFM, DABOM [facility] [address] [place], [place] [address] [phone]

## 2023-06-26 ENCOUNTER — Telehealth: Payer: Self-pay

## 2023-06-26 ENCOUNTER — Ambulatory Visit (INDEPENDENT_AMBULATORY_CARE_PROVIDER_SITE_OTHER): Payer: Commercial Managed Care - PPO | Admitting: Family Medicine

## 2023-06-26 ENCOUNTER — Other Ambulatory Visit: Payer: Self-pay

## 2023-06-26 ENCOUNTER — Encounter: Payer: Self-pay | Admitting: Family Medicine

## 2023-06-26 VITALS — BP 116/74 | HR 75 | Temp 97.8°F | Resp 17 | Ht 64.0 in | Wt 203.0 lb

## 2023-06-26 DIAGNOSIS — Z13220 Encounter for screening for lipoid disorders: Secondary | ICD-10-CM | POA: Diagnosis not present

## 2023-06-26 DIAGNOSIS — Z0000 Encounter for general adult medical examination without abnormal findings: Secondary | ICD-10-CM

## 2023-06-26 DIAGNOSIS — E669 Obesity, unspecified: Secondary | ICD-10-CM

## 2023-06-26 DIAGNOSIS — E1169 Type 2 diabetes mellitus with other specified complication: Secondary | ICD-10-CM

## 2023-06-26 DIAGNOSIS — Z6834 Body mass index (BMI) 34.0-34.9, adult: Secondary | ICD-10-CM | POA: Diagnosis not present

## 2023-06-26 DIAGNOSIS — R7989 Other specified abnormal findings of blood chemistry: Secondary | ICD-10-CM

## 2023-06-26 LAB — CBC WITH DIFFERENTIAL/PLATELET
Basophils Absolute: 0 10*3/uL (ref 0.0–0.1)
Basophils Relative: 0.4 % (ref 0.0–3.0)
Eosinophils Absolute: 0.2 10*3/uL (ref 0.0–0.7)
Eosinophils Relative: 2 % (ref 0.0–5.0)
HCT: 40.4 % (ref 36.0–46.0)
Hemoglobin: 13.2 g/dL (ref 12.0–15.0)
Lymphocytes Relative: 17.4 % (ref 12.0–46.0)
Lymphs Abs: 1.7 10*3/uL (ref 0.7–4.0)
MCHC: 32.6 g/dL (ref 30.0–36.0)
MCV: 80.8 fl (ref 78.0–100.0)
Monocytes Absolute: 0.5 10*3/uL (ref 0.1–1.0)
Monocytes Relative: 5.5 % (ref 3.0–12.0)
Neutro Abs: 7.2 10*3/uL (ref 1.4–7.7)
Neutrophils Relative %: 74.7 % (ref 43.0–77.0)
Platelets: 313 10*3/uL (ref 150.0–400.0)
RBC: 5 Mil/uL (ref 3.87–5.11)
RDW: 15.5 % (ref 11.5–15.5)
WBC: 9.7 10*3/uL (ref 4.0–10.5)

## 2023-06-26 LAB — VITAMIN D 25 HYDROXY (VIT D DEFICIENCY, FRACTURES): VITD: >120 ng/mL

## 2023-06-26 LAB — TSH: TSH: 2.16 u[IU]/mL (ref 0.35–5.50)

## 2023-06-26 LAB — MICROALBUMIN / CREATININE URINE RATIO
Creatinine,U: 201.7 mg/dL
Microalb Creat Ratio: 0.5 mg/g (ref 0.0–30.0)
Microalb, Ur: 1 mg/dL (ref 0.0–1.9)

## 2023-06-26 LAB — BASIC METABOLIC PANEL
BUN: 12 mg/dL (ref 6–23)
CO2: 28 mEq/L (ref 19–32)
Calcium: 9.8 mg/dL (ref 8.4–10.5)
Chloride: 105 mEq/L (ref 96–112)
Creatinine, Ser: 0.68 mg/dL (ref 0.40–1.20)
GFR: 103.82 mL/min (ref >60.00)
Glucose, Bld: 101 mg/dL — ABNORMAL HIGH (ref 70–99)
Potassium: 4.4 mEq/L (ref 3.5–5.1)
Sodium: 141 mEq/L (ref 135–145)

## 2023-06-26 LAB — HEPATIC FUNCTION PANEL
ALT: 14 U/L (ref 0–35)
AST: 14 U/L (ref 0–37)
Albumin: 4.4 g/dL (ref 3.5–5.2)
Alkaline Phosphatase: 53 U/L (ref 39–117)
Bilirubin, Direct: 0.1 mg/dL (ref 0.0–0.3)
Total Bilirubin: 0.4 mg/dL (ref 0.2–1.2)
Total Protein: 7 g/dL (ref 6.0–8.3)

## 2023-06-26 LAB — LIPID PANEL
Cholesterol: 173 mg/dL (ref 0–200)
HDL: 40.2 mg/dL (ref >39.00)
LDL Cholesterol: 122 mg/dL — ABNORMAL HIGH (ref 0–99)
NonHDL: 133.24
Total CHOL/HDL Ratio: 4
Triglycerides: 54 mg/dL (ref 0.0–149.0)
VLDL: 10.8 mg/dL (ref 0.0–40.0)

## 2023-06-26 LAB — HEMOGLOBIN A1C: Hgb A1c MFr Bld: 5.3 % (ref 4.6–6.5)

## 2023-06-26 MED ORDER — BUPROPION HCL ER (XL) 300 MG PO TB24: 300.0000 mg | ORAL_TABLET | Freq: Every day | ORAL | 1 refills | Status: DC

## 2023-06-26 NOTE — Assessment & Plan Note (Signed)
Pt's PE WNL w/ exception of BMI.  Due for colon cancer screening- will revisit in 6 months once she has been at her job longer and is settled in her new apartment.  UTD on pap, mammo, Tdap.  Pt to schedule eye exam.  Check labs.  Anticipatory guidance provided.

## 2023-06-26 NOTE — Telephone Encounter (Signed)
Jacqeline from [employer] called stating that the pt's Vit D is a critical   lab Vit D is greater than 120

## 2023-06-26 NOTE — Patient Instructions (Signed)
Follow up in 6 months to recheck sugar, BP, and cholesterol We'll notify you of your lab results and make any changes if needed Keep up the good work on healthy diet and regular exercise- you're doing great! CONGRATS on all the big changes!!! Schedule your eye exam at your convenience Call with any questions or concerns Stay Safe!  Stay Healthy! Have a great summer!!!

## 2023-06-26 NOTE — Telephone Encounter (Signed)
-----   Message from Sheliah Hatch, MD sent at 06/26/2023 12:57 PM EDT ----- Labs look great w/ exception of elevated Vit D.  Please be sure to hold any Vit D or Calcium supplements as well as any multivitamins that contain either one.  We will repeat your Vit D level at a lab only visit in 1-2 weeks (dx high serum vit D)

## 2023-06-26 NOTE — Assessment & Plan Note (Signed)
Ongoing issue for pt w/ hx of good control.  UTD on foot exam.  Due for microalbumin- ordered.  Due for eye exam- pt to schedule.  Check labs.  Adjust meds prn.

## 2023-06-26 NOTE — Telephone Encounter (Signed)
Addressed via lab result note

## 2023-06-26 NOTE — Telephone Encounter (Signed)
Left pt a VM stating to hold tanning till she has her lab redone

## 2023-06-26 NOTE — Addendum Note (Signed)
Addended by: Sheliah Hatch on: 06/26/2023 01:57 PM   Modules accepted: Orders

## 2023-06-26 NOTE — Progress Notes (Signed)
   Subjective:    Patient ID: Dawn Downs, female    DOB: 11-02-1976, 47 y.o.   MRN: [id number]  HPI CPE- UTD on pap, mammo, Tdap.  Due for colonoscopy and eye exam.  Pt started new job 3 weeks ago  Patient Care Team    Relationship Specialty Notifications Start End  Sheliah Hatch, MD PCP - General Family Medicine  11/14/17   Huel Cote, MD Consulting Physician Obstetrics and Gynecology  12/14/19      Health Maintenance  Topic Date Due   OPHTHALMOLOGY EXAM  11/14/2021   Diabetic kidney evaluation - Urine ACR  09/19/2022   Colonoscopy  09/10/2023 (Originally 03/18/2021)   INFLUENZA VACCINE  07/31/2023   HEMOGLOBIN A1C  09/18/2023   Diabetic kidney evaluation - eGFR measurement  03/17/2024   FOOT EXAM  03/17/2024   DTaP/Tdap/Td (2 - Td or Tdap) 07/03/2025   PAP SMEAR-Modifier  02/25/2026   HPV VACCINES  Aged Out   COVID-19 Vaccine  Discontinued   Hepatitis C Screening  Discontinued   HIV Screening  Discontinued      Review of Systems Patient reports no vision/ hearing changes, adenopathy,fever, weight change,  persistant/recurrent hoarseness , swallowing issues, chest pain, palpitations, edema, persistant/recurrent cough, hemoptysis, dyspnea (rest/exertional/paroxysmal nocturnal), gastrointestinal bleeding (melena, rectal bleeding), abdominal pain, significant heartburn, bowel changes, GU symptoms (dysuria, hematuria, incontinence), Gyn symptoms (abnormal  bleeding, pain),  syncope, focal weakness, memory loss, numbness & tingling, skin/hair/nail changes, abnormal bruising or bleeding, anxiety, or depression.     Objective:   Physical Exam General Appearance:    Alert, cooperative, no distress, appears stated age, obese  Head:    Normocephalic, without obvious abnormality, atraumatic  Eyes:    PERRL, conjunctiva/corneas clear, EOM's intact both eyes  Ears:    Normal TM's and external ear canals, both ears  Nose:   Nares normal, septum midline, mucosa normal, no  drainage    or sinus tenderness  Throat:   Lips, mucosa, and tongue normal; teeth and gums normal  Neck:   Supple, symmetrical, trachea midline, no adenopathy;    Thyroid: no enlargement/tenderness/nodules  Back:     Symmetric, no curvature, ROM normal, no CVA tenderness  Lungs:     Clear to auscultation bilaterally, respirations unlabored  Chest Wall:    No tenderness or deformity   Heart:    Regular rate and rhythm, S1 and S2 normal, no murmur, rub   or gallop  Breast Exam:    Deferred to GYN  Abdomen:     Soft, non-tender, bowel sounds active all four quadrants,    no masses, no organomegaly  Genitalia:    Deferred to GYN  Rectal:    Extremities:   Extremities normal, atraumatic, no cyanosis or edema  Pulses:   2+ and symmetric all extremities  Skin:   Skin color, texture, turgor normal, no rashes or lesions  Lymph nodes:   Cervical, supraclavicular, and axillary nodes normal  Neurologic:   CNII-XII intact, normal strength, sensation and reflexes    throughout          Assessment & Plan:

## 2023-06-26 NOTE — Telephone Encounter (Signed)
Left pt a VM to call office to schedule her lab only visit for one to two to repeat Vit d . Sent results via my chart and left results on her VM to

## 2023-06-26 NOTE — Telephone Encounter (Signed)
Hold tanning until after next labs

## 2023-06-26 NOTE — Telephone Encounter (Signed)
Pt is going to [place] to have the lab repeated . She states she has stopped all supplements for over a year now . She is going to the tanning bed once a week per pt

## 2023-06-26 NOTE — Telephone Encounter (Signed)
Pt has been made aware by my chart and vm

## 2023-06-27 ENCOUNTER — Other Ambulatory Visit (HOSPITAL_COMMUNITY): Payer: Self-pay

## 2023-07-17 ENCOUNTER — Ambulatory Visit (INDEPENDENT_AMBULATORY_CARE_PROVIDER_SITE_OTHER): Payer: Commercial Managed Care - PPO | Admitting: Family Medicine

## 2023-08-08 ENCOUNTER — Encounter (INDEPENDENT_AMBULATORY_CARE_PROVIDER_SITE_OTHER): Payer: Self-pay | Admitting: Family Medicine

## 2023-08-11 ENCOUNTER — Ambulatory Visit (INDEPENDENT_AMBULATORY_CARE_PROVIDER_SITE_OTHER): Payer: Commercial Managed Care - PPO | Admitting: Family Medicine

## 2023-12-03 ENCOUNTER — Ambulatory Visit (INDEPENDENT_AMBULATORY_CARE_PROVIDER_SITE_OTHER): Payer: 59 | Admitting: Family Medicine

## 2023-12-03 ENCOUNTER — Encounter (INDEPENDENT_AMBULATORY_CARE_PROVIDER_SITE_OTHER): Payer: Self-pay | Admitting: Family Medicine

## 2023-12-03 ENCOUNTER — Other Ambulatory Visit (HOSPITAL_BASED_OUTPATIENT_CLINIC_OR_DEPARTMENT_OTHER): Payer: Self-pay

## 2023-12-03 VITALS — BP 141/76 | HR 67 | Temp 98.6°F | Ht 64.0 in | Wt 217.0 lb

## 2023-12-03 DIAGNOSIS — Z6833 Body mass index (BMI) 33.0-33.9, adult: Secondary | ICD-10-CM

## 2023-12-03 DIAGNOSIS — Z7985 Long-term (current) use of injectable non-insulin antidiabetic drugs: Secondary | ICD-10-CM

## 2023-12-03 DIAGNOSIS — E1169 Type 2 diabetes mellitus with other specified complication: Secondary | ICD-10-CM

## 2023-12-03 DIAGNOSIS — E669 Obesity, unspecified: Secondary | ICD-10-CM

## 2023-12-03 DIAGNOSIS — E559 Vitamin D deficiency, unspecified: Secondary | ICD-10-CM | POA: Diagnosis not present

## 2023-12-03 DIAGNOSIS — F39 Unspecified mood [affective] disorder: Secondary | ICD-10-CM

## 2023-12-03 DIAGNOSIS — R030 Elevated blood-pressure reading, without diagnosis of hypertension: Secondary | ICD-10-CM | POA: Diagnosis not present

## 2023-12-03 DIAGNOSIS — Z6841 Body Mass Index (BMI) 40.0 and over, adult: Secondary | ICD-10-CM

## 2023-12-03 DIAGNOSIS — E785 Hyperlipidemia, unspecified: Secondary | ICD-10-CM | POA: Diagnosis not present

## 2023-12-03 MED ORDER — TIRZEPATIDE 12.5 MG/0.5ML ~~LOC~~ SOAJ
12.5000 mg | SUBCUTANEOUS | 0 refills | Status: DC
  Filled 2023-12-03: qty 2, 28d supply, fill #0

## 2023-12-03 NOTE — Progress Notes (Signed)
Dawn Downs, D.O.  ABFM, ABOM Specializing in Clinical Bariatric Medicine  Office located at: [address] [place], [place]  [address]   Assessment and Plan:   FOR THE DISEASE OF OBESITY: Morbid obesity: Starting BMI 44.2/DATE 09/20/21 BMI 40.0-44.9, adult (HCC)-CURRENT 33.97 Since last office visit on 06/25/2023 patient's  Muscle mass has increased by 1.8lb. Fat mass has increased by 14.6lb. Total body water has increased by 0.8lb.  Counseling done on how various foods will affect these numbers and how to maximize success  Total lbs lost to date: 41 Total weight loss percentage to date: 15.89    Recommended Dietary Goals Dawn Downs is currently in the action stage of change. As such, her goal is to continue weight management plan.  She has agreed to: continue current plan   Behavioral Intervention We discussed the following today: practice mindfulness eating and understand the difference between hunger signals and cravings, work on managing stress, creating time for self-care and relaxation, continue to work on implementation of reduced calorie nutritional plan, planning for success, and continue to work on maintaining a reduced calorie state, getting the recommended amount of protein, incorporating whole foods, making healthy choices, staying well hydrated and practicing mindfulness when eating.  Additional resources provided today: None  Evidence-based interventions for health behavior change were utilized today including the discussion of self monitoring techniques, problem-solving barriers and SMART goal setting techniques.   Regarding patient's less desirable eating habits and patterns, we employed the technique of small changes.   Pt will specifically work on: Get back on plan for next visit.    Recommended Physical Activity Goals Dawn Downs has been advised to work up to 150 minutes of moderate intensity aerobic activity a week and strengthening exercises 2-3  times per week for cardiovascular health, weight loss maintenance and preservation of muscle mass.   She has agreed to :  Think about enjoyable ways to increase daily physical activity and overcoming barriers to exercise and Increase physical activity in their day and reduce sedentary time (increase NEAT).   Pharmacotherapy We discussed various medication options to help Dawn Downs with her weight loss efforts and we both agreed to : continue with nutritional and behavioral strategies and continue current anti-obesity medication regimen   FOR ASSOCIATED CONDITIONS ADDRESSED TODAY: Type 2 diabetes mellitus with obesity (HCC) Assessment:  Condition is Not at goal.. Labs were reviewed. Pt A1c level has improved from 5.4 to 5.3 as of 06/26/2023. Her CMP and TSH levels are within optimal range as of June, 2024. Pt was placed on Metformin by her PCP which she tolerates well and denies any GI upset. She also informed me that since her last visit on 06/25/2023 she had a excess of mounjaro 12.5mg  left and has been taking this every 2-3 weeks. She informed me that she has experienced GI upset after taking this. Lab Results  Component Value Date   HGBA1C 5.3 06/26/2023   HGBA1C 5.4 03/18/2023   HGBA1C 5.1 12/20/2022   INSULIN 27.9 (H) 09/20/2021   Plan:  Since pt now has insurance back I informed her of rule changes certain companies had with the coverage of Mounjaro and other GLP-1's. I will refill Mounjaro 12.5mg  and she is to continue Metformin as directed. Reminded Dawn Downs if she feels poorly to check her Blood Sugar and Blood Pressure at that time. Recommend that any concerns about medicines should be directed at the prescribing provider. I will recheck her A1c today.  Type 2 diabetes  mellitus with obesity (HCC) -     Tirzepatide; Inject 12.5 mg into the skin once a week.  Dispense: 2 mL; Refill: 0 -     Hemoglobin A1c -     Hepatic function panel -     Basic metabolic panel -     Lipid  panel -     Magnesium -     Vitamin B12   H/O of vitamin D deficiency.  Assessment:  Condition is Not optimized.. Labs were reviewed. Pt vitamin D level was 36.1 back in March, 2023 and she was placed on vitamin D supplement. The last 3 times her vitamin D has been over 100. Pt had vitamin D levels done by Dawn Downs and this has increased from 91.2 to >120. She informed em that she has not taken a vitamin D supplement in over a year. Pt does use a tanning bed once weekly.  Lab Results  Component Value Date   VD25OH >120 06/26/2023   VD25OH 91.2 01/23/2023   VD25OH 109.84 (HH) 12/20/2022   Plan: Continue to hold off on vitamin D supplements. Ideal vitamin D levels reviewed with patient  Pt was encouraged to follow-up with PCP about this condition.  h/o Vitamin D deficiency -     VITAMIN D 25 Hydroxy (Vit-D Deficiency, Fractures)   Elevated blood pressure reading Assessment: Condition is Not at goal.. Pt BP today is elevated at 141/76. Pt informed me that she does not monitor her BP at home. This is diet/exercise controlled.  Last 3 blood pressure readings in our office are as follows: BP Readings from Last 3 Encounters:  12/03/23 (!) 141/76  06/26/23 116/74  06/25/23 131/86   Plan: Pt was instructed to monitor her BP once every other week. Lifestyle changes such as following our Downs salt, heart healthy meal plan and engaging in a regular exercise program discussed.Reminded patient that if they ever feel poorly in any way, to check their blood pressure and pulse as well. We will continue to monitor closely alongside PCP/ specialists.  Pt reminded to also f/up with those individuals as instructed by them.    Morbid obesity: Starting BMI 44.2/DATE 09/20/21 Assessment: Condition is ok. Denies any SI/HI. Mood is stable. Due to pt lost in job over the summer she has endured high stress levels and endorses having some emotional eating.   Plan:  Continue with Wellbutrin XL 300mg  once  daily. Importance of following up with PCP and others was stressed. Reminded patient of the importance of following their prudent nutrition plan and how food can affect mood as well to support emotional wellbeing.  Mood disorder (HCC) with emotional eating -     Hepatic function panel -     Basic metabolic panel   Hyperlipidemia associated with type 2 diabetes mellitus (HCC) Assessment:  Condition is Improving, but not optimized.. Labs were reviewed. Pt LDL has improved from 123 to 122 as of 06/26/2023. Her HLD has also improved from 33.20 to 40.20. This is being treated with Crestor which she tolerates well.  Lab Results  Component Value Date   CHOL 173 06/26/2023   HDL 40.20 06/26/2023   LDLCALC 122 (H) 06/26/2023   TRIG 54.0 06/26/2023   CHOLHDL 4 06/26/2023   Plan:  Continue Crestor 10mg  once daily. Dawn Downs agrees to continue with meds and/or our treatment plan of a heart-heathy, Downs cholesterol meal plan. We extensively discussed several lifestyle modifications today and she will continue to work on diet, exercise and  weight loss efforts. I will recheck levels today.    Follow up:   Return in about 20 days (around 12/23/2023). She was informed of the importance of frequent follow up visits to maximize her success with intensive lifestyle modifications for her multiple health conditions.  Subjective:   Chief complaint: Obesity Dawn Downs is here to discuss her progress with her obesity treatment plan. She is on the the Category 3 Plan with B and L and states she is following her eating plan approximately 60% of the time. She states she is not exercising.   Interval History:  BELLANY ANDERMAN is here for a follow up office visit. Since last OV,  she has been ok. Pt was last her on 06/25/2023. She had to take time off from coming here as she lost her insurance. Pt informed me that she did find another job but this did not work out and has caused her a Downs of stress. She is  now at another job and has insurance again.    Barriers identified: moderate to high levels of stress and work .   Pharmacotherapy for weight loss: She is currently taking Metformin (off label use for incretin effect and / or insulin resistance and / or diabetes prevention) with adequate clinical response  and without side effects. and Monjauro with diabetes as the primary indication with adequate clinical response  and experiencing the following side effects: GI upset..   Review of Systems:  Pertinent positives were addressed with patient today.  Reviewed by clinician on day of visit: allergies, medications, problem list, medical history, surgical history, family history, social history, and previous encounter notes.  Weight Summary and Biometrics   Weight Lost Since Last Visit: 0lb  Weight Gained Since Last Visit: 16lb    Vitals Temp: 98.6 F (37 C) BP: (!) 141/76 Pulse Rate: 67 SpO2: 100 %   Anthropometric Measurements Height: 5\' 4"  (1.626 m) Weight: 217 lb (98.4 kg) BMI (Calculated): 37.23 Weight at Last Visit: 201lb Weight Lost Since Last Visit: 0lb Weight Gained Since Last Visit: 16lb Starting Weight: 258lb Total Weight Loss (lbs): 41 lb (18.6 kg) Peak Weight: 281   Body Composition  Body Fat %: 44.3 % Fat Mass (lbs): 96.4 lbs Muscle Mass (lbs): 115.2 lbs Total Body Water (lbs): 80.8 lbs Visceral Fat Rating : 12   Other Clinical Data Fasting: no Labs: no Today's Visit #: 31 Starting Date: 09/20/21    Objective:   PHYSICAL EXAM: Blood pressure (!) 141/76, pulse 67, temperature 98.6 F (37 C), height 5\' 4"  (1.626 m), weight 217 lb (98.4 kg), SpO2 100%. Body mass index is 37.25 kg/m.  General: she is overweight, cooperative and in no acute distress. PSYCH: Has normal mood, affect and thought process.   HEENT: EOMI, sclerae are anicteric. Lungs: Normal breathing effort, no conversational dyspnea. Extremities: Moves * 4 Neurologic: A and O * 3,  good insight  DIAGNOSTIC DATA REVIEWED: BMET    Component Value Date/Time   NA 141 06/26/2023 0806   NA 142 01/23/2023 0936   K 4.4 06/26/2023 0806   CL 105 06/26/2023 0806   CO2 28 06/26/2023 0806   GLUCOSE 101 (H) 06/26/2023 0806   BUN 12 06/26/2023 0806   BUN 12 01/23/2023 0936   CREATININE 0.68 06/26/2023 0806   CREATININE 0.53 12/21/2020 1043   CALCIUM 9.8 06/26/2023 0806   GFRNONAA >60 11/10/2009 1045   GFRAA  11/10/2009 1045    >60        The  eGFR has been calculated using the MDRD equation. This calculation has not been validated in all clinical situations. eGFR's persistently <60 mL/min signify possible Chronic Kidney Disease.   Lab Results  Component Value Date   HGBA1C 5.3 06/26/2023   HGBA1C  05/31/2009    5.1 (NOTE) The ADA recommends the following therapeutic goal for glycemic control related to Hgb A1c measurement: Goal of therapy: <6.5 Hgb A1c  Reference: American Diabetes Association: Clinical Practice Recommendations 2010, Diabetes Care, 2010, 33: (Suppl  1).   Lab Results  Component Value Date   INSULIN 27.9 (H) 09/20/2021   Lab Results  Component Value Date   TSH 2.16 06/26/2023   CBC    Component Value Date/Time   WBC 9.7 06/26/2023 0806   RBC 5.00 06/26/2023 0806   HGB 13.2 06/26/2023 0806   HCT 40.4 06/26/2023 0806   PLT 313.0 06/26/2023 0806   MCV 80.8 06/26/2023 0806   MCH 26.5 (L) 12/21/2020 1043   MCHC 32.6 06/26/2023 0806   RDW 15.5 06/26/2023 0806   Iron Studies No results found for: "IRON", "TIBC", "FERRITIN", "IRONPCTSAT" Lipid Panel     Component Value Date/Time   CHOL 173 06/26/2023 0806   TRIG 54.0 06/26/2023 0806   HDL 40.20 06/26/2023 0806   CHOLHDL 4 06/26/2023 0806   VLDL 10.8 06/26/2023 0806   LDLCALC 122 (H) 06/26/2023 0806   LDLCALC 127 (H) 12/21/2020 1043   Hepatic Function Panel     Component Value Date/Time   PROT 7.0 06/26/2023 0806   PROT 6.8 01/23/2023 0936   ALBUMIN 4.4 06/26/2023 0806    ALBUMIN 4.7 01/23/2023 0936   AST 14 06/26/2023 0806   ALT 14 06/26/2023 0806   ALKPHOS 53 06/26/2023 0806   BILITOT 0.4 06/26/2023 0806   BILITOT 0.3 01/23/2023 0936   BILIDIR 0.1 06/26/2023 0806   IBILI 0.3 12/21/2020 1043      Component Value Date/Time   TSH 2.16 06/26/2023 0806   Nutritional Lab Results  Component Value Date   VD25OH >120 06/26/2023   VD25OH 91.2 01/23/2023   VD25OH 109.84 (HH) 12/20/2022    Attestations:   I, Clinical biochemist, acting as a Stage manager for [employer], DO., have compiled all relevant documentation for today's office visit on behalf of Dawn Lot, DO, while in the presence of [employer], DO.  Reviewed by clinician on day of visit: allergies, medications, problem list, medical history, surgical history, family history, social history, and previous encounter notes pertinent to patient's obesity diagnosis. I have spent 40  minutes in the care of the patient today including: preparing to see patient (e.g. review and interpretation of tests, old notes ), obtaining and/or reviewing separately obtained history, performing a medically appropriate examination or evaluation, counseling and educating the patient, ordering medications, test or procedures, documenting clinical information in the electronic or other health care record, and independently interpreting results and communicating results to the patient, family, or caregiver   I have reviewed the above documentation for accuracy and completeness, and I agree with the above. Dawn Downs, D.O.  The 21st Century Cures Act was signed into law in 2016 which includes the topic of electronic health records.  This provides immediate access to information in MyChart.  This includes consultation notes, operative notes, office notes, lab results and pathology reports.  If you have any questions about what you read please let us know at your next visit so we can discuss your concerns and take  corrective action if  need be.  We are right here with you.

## 2023-12-04 ENCOUNTER — Other Ambulatory Visit (HOSPITAL_BASED_OUTPATIENT_CLINIC_OR_DEPARTMENT_OTHER): Payer: Self-pay

## 2023-12-04 LAB — HEMOGLOBIN A1C
Est. average glucose Bld gHb Est-mCnc: 111 mg/dL
Hgb A1c MFr Bld: 5.5 % (ref 4.8–5.6)

## 2023-12-04 LAB — LIPID PANEL
Chol/HDL Ratio: 4 {ratio} (ref 0.0–4.4)
Cholesterol, Total: 201 mg/dL — ABNORMAL HIGH (ref 100–199)
HDL: 50 mg/dL (ref >39)
LDL Chol Calc (NIH): 142 mg/dL — ABNORMAL HIGH (ref 0–99)
Triglycerides: 50 mg/dL (ref 0–149)
VLDL Cholesterol Cal: 9 mg/dL (ref 5–40)

## 2023-12-04 LAB — BASIC METABOLIC PANEL
BUN/Creatinine Ratio: 28 — ABNORMAL HIGH (ref 9–23)
BUN: 20 mg/dL (ref 6–24)
CO2: 22 mmol/L (ref 20–29)
Calcium: 9.4 mg/dL (ref 8.7–10.2)
Chloride: 106 mmol/L (ref 96–106)
Creatinine, Ser: 0.71 mg/dL (ref 0.57–1.00)
Glucose: 105 mg/dL — ABNORMAL HIGH (ref 70–99)
Potassium: 4.5 mmol/L (ref 3.5–5.2)
Sodium: 144 mmol/L (ref 134–144)
eGFR: 105 mL/min/{1.73_m2} (ref >59)

## 2023-12-04 LAB — VITAMIN B12: Vitamin B-12: 271 pg/mL (ref 232–1245)

## 2023-12-04 LAB — HEPATIC FUNCTION PANEL
ALT: 13 [IU]/L (ref 0–32)
AST: 11 [IU]/L (ref 0–40)
Albumin: 4.6 g/dL (ref 3.9–4.9)
Alkaline Phosphatase: 79 [IU]/L (ref 44–121)
Bilirubin Total: 0.2 mg/dL (ref 0.0–1.2)
Bilirubin, Direct: 0.1 mg/dL (ref 0.00–0.40)
Total Protein: 7.2 g/dL (ref 6.0–8.5)

## 2023-12-04 LAB — MAGNESIUM: Magnesium: 2.3 mg/dL (ref 1.6–2.3)

## 2023-12-04 LAB — VITAMIN D 25 HYDROXY (VIT D DEFICIENCY, FRACTURES): Vit D, 25-Hydroxy: 89.7 ng/mL (ref 30.0–100.0)

## 2023-12-05 ENCOUNTER — Other Ambulatory Visit (HOSPITAL_BASED_OUTPATIENT_CLINIC_OR_DEPARTMENT_OTHER): Payer: Self-pay

## 2023-12-08 ENCOUNTER — Other Ambulatory Visit (HOSPITAL_BASED_OUTPATIENT_CLINIC_OR_DEPARTMENT_OTHER): Payer: Self-pay

## 2023-12-09 ENCOUNTER — Other Ambulatory Visit (HOSPITAL_BASED_OUTPATIENT_CLINIC_OR_DEPARTMENT_OTHER): Payer: Self-pay

## 2023-12-10 ENCOUNTER — Other Ambulatory Visit (HOSPITAL_BASED_OUTPATIENT_CLINIC_OR_DEPARTMENT_OTHER): Payer: Self-pay

## 2023-12-10 ENCOUNTER — Telehealth: Payer: Self-pay | Admitting: *Deleted

## 2023-12-10 NOTE — Telephone Encounter (Signed)
Prior authorization done via cover my meds for patients Mounjaro. Got an instant approval.  Approved today by [employer] 2017 CaseId:[id number];Status:Approved;Review Type:Prior Auth;Coverage Start Date:11/30/2023;Coverage End Date:12/09/2024; Authorization Expiration Date: 12/08/2024  Patient has been notified.

## 2023-12-25 ENCOUNTER — Encounter (INDEPENDENT_AMBULATORY_CARE_PROVIDER_SITE_OTHER): Payer: Self-pay | Admitting: Family Medicine

## 2023-12-25 ENCOUNTER — Other Ambulatory Visit (HOSPITAL_BASED_OUTPATIENT_CLINIC_OR_DEPARTMENT_OTHER): Payer: Self-pay

## 2023-12-25 ENCOUNTER — Ambulatory Visit (INDEPENDENT_AMBULATORY_CARE_PROVIDER_SITE_OTHER): Payer: 59 | Admitting: Family Medicine

## 2023-12-25 VITALS — BP 133/74 | HR 62 | Temp 97.5°F | Ht 64.0 in | Wt 221.0 lb

## 2023-12-25 DIAGNOSIS — E559 Vitamin D deficiency, unspecified: Secondary | ICD-10-CM

## 2023-12-25 DIAGNOSIS — Z7984 Long term (current) use of oral hypoglycemic drugs: Secondary | ICD-10-CM

## 2023-12-25 DIAGNOSIS — E669 Obesity, unspecified: Secondary | ICD-10-CM

## 2023-12-25 DIAGNOSIS — E785 Hyperlipidemia, unspecified: Secondary | ICD-10-CM

## 2023-12-25 DIAGNOSIS — Z6837 Body mass index (BMI) 37.0-37.9, adult: Secondary | ICD-10-CM

## 2023-12-25 DIAGNOSIS — E119 Type 2 diabetes mellitus without complications: Secondary | ICD-10-CM | POA: Diagnosis not present

## 2023-12-25 DIAGNOSIS — E538 Deficiency of other specified B group vitamins: Secondary | ICD-10-CM

## 2023-12-25 DIAGNOSIS — E1169 Type 2 diabetes mellitus with other specified complication: Secondary | ICD-10-CM

## 2023-12-25 MED ORDER — TIRZEPATIDE 12.5 MG/0.5ML ~~LOC~~ SOAJ
12.5000 mg | SUBCUTANEOUS | 0 refills | Status: DC
  Filled 2023-12-25: qty 2, 28d supply, fill #0

## 2023-12-25 NOTE — Progress Notes (Signed)
.smr  Office: [phone]  /  Fax: [phone]  WEIGHT SUMMARY AND BIOMETRICS  Anthropometric Measurements Height: 5\' 4"  (1.626 m) Weight: 221 lb (100.2 kg) BMI (Calculated): 37.92 Weight at Last Visit: 217 lb Weight Lost Since Last Visit: 0 Weight Gained Since Last Visit: 4 lb Starting Weight: 258 lb Total Weight Loss (lbs): 37 lb (16.8 kg) Peak Weight: 281 lb   Body Composition  Body Fat %: 44.2 % Fat Mass (lbs): 97.6 lbs Muscle Mass (lbs): 117.2 lbs Total Body Water (lbs): 82.4 lbs Visceral Fat Rating : 12   Other Clinical Data Fasting: No Labs: No Today's Visit #: 32 Starting Date: 09/20/21    Chief Complaint: OBESITY   History of Present Illness   The patient, diagnosed with type two diabetes and obesity, has been managing her conditions with Mounjaro, metformin, diet, exercise, and weight loss strategies. She also has hyperlipidemia, for which she is taking Crestor and reducing cholesterol-rich foods in her diet. Over the past three weeks, she has gained four pounds, which she attributes to the holiday season. She reports adherence to her category three eating plan about 60-70% of the time but has not been exercising recently.  Her vitamin D levels, which have a history of being over-replaced, are currently at goal despite not taking any vitamin D supplements. She has also stopped her multivitamin, resulting in a low B12 level of 271.  Her diabetes is well-controlled with Mounjaro, as evidenced by an A1c of 5.5.          PHYSICAL EXAM:  Blood pressure 133/74, pulse 62, temperature (!) 97.5 F (36.4 C), height 5\' 4"  (1.626 m), weight 221 lb (100.2 kg), SpO2 99%. Body mass index is 37.93 kg/m.  DIAGNOSTIC DATA REVIEWED:  BMET    Component Value Date/Time   NA 144 12/03/2023 0817   K 4.5 12/03/2023 0817   CL 106 12/03/2023 0817   CO2 22 12/03/2023 0817   GLUCOSE 105 (H) 12/03/2023 0817   GLUCOSE 101 (H) 06/26/2023 0806   BUN 20 12/03/2023 0817    CREATININE 0.71 12/03/2023 0817   CREATININE 0.53 12/21/2020 1043   CALCIUM 9.4 12/03/2023 0817   GFRNONAA >60 11/10/2009 1045   GFRAA  11/10/2009 1045    >60        The eGFR has been calculated using the MDRD equation. This calculation has not been validated in all clinical situations. eGFR's persistently <60 mL/min signify possible Chronic Kidney Disease.   Lab Results  Component Value Date   HGBA1C 5.5 12/03/2023   HGBA1C  05/31/2009    5.1 (NOTE) The ADA recommends the following therapeutic goal for glycemic control related to Hgb A1c measurement: Goal of therapy: <6.5 Hgb A1c  Reference: American Diabetes Association: Clinical Practice Recommendations 2010, Diabetes Care, 2010, 33: (Suppl  1).   Lab Results  Component Value Date   INSULIN 27.9 (H) 09/20/2021   Lab Results  Component Value Date   TSH 2.16 06/26/2023   CBC    Component Value Date/Time   WBC 9.7 06/26/2023 0806   RBC 5.00 06/26/2023 0806   HGB 13.2 06/26/2023 0806   HCT 40.4 06/26/2023 0806   PLT 313.0 06/26/2023 0806   MCV 80.8 06/26/2023 0806   MCH 26.5 (L) 12/21/2020 1043   MCHC 32.6 06/26/2023 0806   RDW 15.5 06/26/2023 0806   Iron Studies No results found for: "IRON", "TIBC", "FERRITIN", "IRONPCTSAT" Lipid Panel     Component Value Date/Time   CHOL 201 (H) 12/03/2023 2130  TRIG 50 12/03/2023 0817   HDL 50 12/03/2023 0817   CHOLHDL 4.0 12/03/2023 0817   CHOLHDL 4 06/26/2023 0806   VLDL 10.8 06/26/2023 0806   LDLCALC 142 (H) 12/03/2023 0817   LDLCALC 127 (H) 12/21/2020 1043   Hepatic Function Panel     Component Value Date/Time   PROT 7.2 12/03/2023 0817   ALBUMIN 4.6 12/03/2023 0817   AST 11 12/03/2023 0817   ALT 13 12/03/2023 0817   ALKPHOS 79 12/03/2023 0817   BILITOT 0.2 12/03/2023 0817   BILIDIR 0.10 12/03/2023 0817   IBILI 0.3 12/21/2020 1043      Component Value Date/Time   TSH 2.16 06/26/2023 0806   Nutritional Lab Results  Component Value Date   VD25OH 89.7  12/03/2023   VD25OH >120 06/26/2023   VD25OH 91.2 01/23/2023     Assessment and Plan    Type 2 Diabetes Mellitus Well-controlled with current medications. Blood sugars stable, A1c 5.5. On Mounjaro and metformin. Inconsistent with exercise. Informed consent for [facility] discussed, including potential side effects and benefits of maintaining blood sugar control. - Refill Mounjaro - Continue metformin - Encourage adherence to diet and exercise regimen - Follow up with Dr. Sharee Holster in three weeks  Obesity Recent weight gain of four pounds over Christmas. Following category three eating plan 60-70% of the time, not exercising recently. Discussed benefits of weight loss and risks of obesity-related complications. - Continue working on diet and exercise - Encourage more consistent adherence to the eating plan - Reinforce the importance of regular physical activity  Hyperlipidemia Managed with Crestor. Working on decreasing cholesterol-rich foods in diet. Discussed benefits of cholesterol management and potential cardiovascular outcomes. - Continue Crestor - Encourage continued dietary modifications to reduce cholesterol intake  Vitamin B12 Deficiency Recent level of 271. Not on B12 supplements, stopped multivitamin. Discussed risks of untreated B12 deficiency and benefits of supplementation. - Start over-the-counter B12 supplements - Recheck B12 levels in three months  General Health Maintenance Vitamin D levels at goal despite being off supplements. No current need for vitamin D supplementation. - Monitor vitamin D levels periodically  Follow-up - Follow up with Dr. Sharee Holster in three weeks.        She was informed of the importance of frequent follow up visits to maximize her success with intensive lifestyle modifications for her multiple health conditions.    Quillian Quince, MD

## 2023-12-26 ENCOUNTER — Ambulatory Visit: Payer: Commercial Managed Care - PPO | Admitting: Family Medicine

## 2023-12-30 ENCOUNTER — Ambulatory Visit (INDEPENDENT_AMBULATORY_CARE_PROVIDER_SITE_OTHER): Payer: No Typology Code available for payment source | Admitting: Family Medicine

## 2023-12-30 ENCOUNTER — Encounter: Payer: Self-pay | Admitting: Family Medicine

## 2023-12-30 ENCOUNTER — Other Ambulatory Visit (HOSPITAL_BASED_OUTPATIENT_CLINIC_OR_DEPARTMENT_OTHER): Payer: Self-pay

## 2023-12-30 VITALS — BP 122/64 | HR 71 | Temp 97.7°F | Ht 64.0 in | Wt 223.2 lb

## 2023-12-30 DIAGNOSIS — E669 Obesity, unspecified: Secondary | ICD-10-CM

## 2023-12-30 DIAGNOSIS — E785 Hyperlipidemia, unspecified: Secondary | ICD-10-CM

## 2023-12-30 DIAGNOSIS — Z7984 Long term (current) use of oral hypoglycemic drugs: Secondary | ICD-10-CM

## 2023-12-30 DIAGNOSIS — E1169 Type 2 diabetes mellitus with other specified complication: Secondary | ICD-10-CM | POA: Diagnosis not present

## 2023-12-30 DIAGNOSIS — E1165 Type 2 diabetes mellitus with hyperglycemia: Secondary | ICD-10-CM | POA: Diagnosis not present

## 2023-12-30 DIAGNOSIS — F39 Unspecified mood [affective] disorder: Secondary | ICD-10-CM

## 2023-12-30 DIAGNOSIS — Z7985 Long-term (current) use of injectable non-insulin antidiabetic drugs: Secondary | ICD-10-CM

## 2023-12-30 DIAGNOSIS — Z1211 Encounter for screening for malignant neoplasm of colon: Secondary | ICD-10-CM

## 2023-12-30 MED ORDER — BUPROPION HCL ER (XL) 150 MG PO TB24
150.0000 mg | ORAL_TABLET | Freq: Every day | ORAL | 1 refills | Status: DC
  Filled 2023-12-30: qty 90, 90d supply, fill #0

## 2023-12-30 MED ORDER — BUPROPION HCL ER (XL) 300 MG PO TB24
300.0000 mg | ORAL_TABLET | Freq: Every day | ORAL | 1 refills | Status: DC
  Filled 2023-12-30: qty 90, 90d supply, fill #0

## 2023-12-30 MED ORDER — BUPROPION HCL ER (XL) 150 MG PO TB24
150.0000 mg | ORAL_TABLET | Freq: Every day | ORAL | 5 refills | Status: AC
  Filled 2023-12-30 – 2024-01-28 (×2): qty 30, 30d supply, fill #0
  Filled 2024-02-24 – 2024-03-26 (×2): qty 30, 30d supply, fill #1
  Filled 2024-04-26: qty 30, 30d supply, fill #2
  Filled 2024-05-26: qty 30, 30d supply, fill #3
  Filled 2024-06-25: qty 30, 30d supply, fill #4

## 2023-12-30 MED ORDER — BUPROPION HCL ER (XL) 300 MG PO TB24
300.0000 mg | ORAL_TABLET | Freq: Every day | ORAL | 5 refills | Status: AC
  Filled 2023-12-30 – 2024-01-28 (×2): qty 30, 30d supply, fill #0
  Filled 2024-02-24 – 2024-03-26 (×2): qty 30, 30d supply, fill #1
  Filled 2024-04-26: qty 30, 30d supply, fill #2
  Filled 2024-05-26: qty 30, 30d supply, fill #3
  Filled 2024-06-25: qty 30, 30d supply, fill #4

## 2023-12-30 MED ORDER — METFORMIN HCL ER 500 MG PO TB24
500.0000 mg | ORAL_TABLET | Freq: Two times a day (BID) | ORAL | 1 refills | Status: AC
  Filled 2023-12-30 – 2024-01-28 (×2): qty 180, 90d supply, fill #0
  Filled 2024-06-25: qty 180, 90d supply, fill #1

## 2023-12-30 MED ORDER — ROSUVASTATIN CALCIUM 10 MG PO TABS
10.0000 mg | ORAL_TABLET | Freq: Every day | ORAL | 1 refills | Status: AC
  Filled 2023-12-30 – 2024-01-28 (×2): qty 90, 90d supply, fill #0
  Filled 2024-06-25: qty 90, 90d supply, fill #1

## 2023-12-30 NOTE — Assessment & Plan Note (Signed)
Ongoing issue.  Currently on Mounjaro 12.5mg  weekly and Metformin XR 500mg  BID.  Eye exam is scheduled.  UTD on foot exam and microalbumin.  A1C is well controlled at 5.5%.  currently asymptomatic.  No changes at this time.

## 2023-12-30 NOTE — Assessment & Plan Note (Signed)
 Deteriorated.  Pt reports increased stress and anxiety this year w/ multiple job changes.  Will increase Wellbutrin to 450mg  daily and monitor for improvement.  Pt expressed understanding and is in agreement w/ plan.

## 2023-12-30 NOTE — Assessment & Plan Note (Signed)
Ongoing issue.  Following w/ Dr Dalbert Garnet but struggling to lose weight.  Current BMI 38.32  Reviewed recent labs.  Will continue to follow.

## 2023-12-30 NOTE — Progress Notes (Signed)
   Subjective:    Patient ID: Dawn Downs, female    DOB: 1976-02-03, 47 y.o.   MRN: [id number]  HPI DM- chronic problem, on Mounjaro  12.5mg  weekly and Metformin  XR 500mg  BID.  UTD on foot exam, microalbumin.  Pt reports eye exam is scheduled.  A1C 5.5%  Denies CP, SOB, HA's, visual changes, edema.  Hyperlipidemia- chronic problem, on Crestor  10mg  daily.  LDL earlier this month 142, HDL 50.  Admits she has not been taking Crestor .  Denies abd pain, N/V  Obesity- ongoing issue for pt.  Following w/ Dr Verdon.  BMI currently 38.32  Mood disorder- pt reports increased stress.  Took a new job earlier this year and found it was offered under false pretenses.  Would like to increase Wellbutrin  if possible.     Review of Systems For ROS see HPI     Objective:   Physical Exam Vitals reviewed.  Constitutional:      General: She is not in acute distress.    Appearance: Normal appearance. She is well-developed. She is obese. She is not ill-appearing.  HENT:     Head: Normocephalic and atraumatic.  Eyes:     Conjunctiva/sclera: Conjunctivae normal.     Pupils: Pupils are equal, round, and reactive to light.  Neck:     Thyroid : No thyromegaly.  Cardiovascular:     Rate and Rhythm: Normal rate and regular rhythm.     Pulses: Normal pulses.     Heart sounds: Normal heart sounds. No murmur heard. Pulmonary:     Effort: Pulmonary effort is normal. No respiratory distress.     Breath sounds: Normal breath sounds.  Abdominal:     General: There is no distension.     Palpations: Abdomen is soft.     Tenderness: There is no abdominal tenderness.  Musculoskeletal:     Cervical back: Normal range of motion and neck supple.     Right lower leg: No edema.     Left lower leg: No edema.  Lymphadenopathy:     Cervical: No cervical adenopathy.  Skin:    General: Skin is warm and dry.  Neurological:     General: No focal deficit present.     Mental Status: She is alert and oriented to  person, place, and time.  Psychiatric:        Behavior: Behavior normal.           Assessment & Plan:

## 2023-12-30 NOTE — Assessment & Plan Note (Signed)
Chronic problem.  Recent LDL 142 but pt admits she has not been taking her Crestor.  Restart daily.  Prescription sent to pharmacy.

## 2023-12-30 NOTE — Patient Instructions (Signed)
 Schedule your complete physical in 6 months No need for labs today- yay!!! Continue to work on healthy diet and regular exercise- you can do it! RESTART the Crestor  daily INCREASE the Wellbutrin  to 450mg  daily- 1 300mg  and 1 150mg  daily Call with any questions or concerns Stay Safe!  Stay Healthy! Happy New Year!!

## 2024-01-12 ENCOUNTER — Other Ambulatory Visit (HOSPITAL_BASED_OUTPATIENT_CLINIC_OR_DEPARTMENT_OTHER): Payer: Self-pay

## 2024-01-19 ENCOUNTER — Ambulatory Visit (INDEPENDENT_AMBULATORY_CARE_PROVIDER_SITE_OTHER): Payer: 59 | Admitting: Family Medicine

## 2024-01-19 ENCOUNTER — Other Ambulatory Visit (HOSPITAL_BASED_OUTPATIENT_CLINIC_OR_DEPARTMENT_OTHER): Payer: Self-pay

## 2024-01-19 ENCOUNTER — Encounter (INDEPENDENT_AMBULATORY_CARE_PROVIDER_SITE_OTHER): Payer: Self-pay | Admitting: Family Medicine

## 2024-01-19 VITALS — BP 131/79 | HR 83 | Temp 97.7°F | Ht 64.0 in | Wt 221.0 lb

## 2024-01-19 DIAGNOSIS — E1169 Type 2 diabetes mellitus with other specified complication: Secondary | ICD-10-CM | POA: Diagnosis not present

## 2024-01-19 DIAGNOSIS — F39 Unspecified mood [affective] disorder: Secondary | ICD-10-CM

## 2024-01-19 DIAGNOSIS — F5089 Other specified eating disorder: Secondary | ICD-10-CM | POA: Diagnosis not present

## 2024-01-19 DIAGNOSIS — E538 Deficiency of other specified B group vitamins: Secondary | ICD-10-CM

## 2024-01-19 DIAGNOSIS — E669 Obesity, unspecified: Secondary | ICD-10-CM

## 2024-01-19 DIAGNOSIS — Z6837 Body mass index (BMI) 37.0-37.9, adult: Secondary | ICD-10-CM

## 2024-01-19 DIAGNOSIS — Z7985 Long-term (current) use of injectable non-insulin antidiabetic drugs: Secondary | ICD-10-CM

## 2024-01-19 DIAGNOSIS — Z6841 Body Mass Index (BMI) 40.0 and over, adult: Secondary | ICD-10-CM

## 2024-01-19 MED ORDER — TIRZEPATIDE 12.5 MG/0.5ML ~~LOC~~ SOAJ
12.5000 mg | SUBCUTANEOUS | 0 refills | Status: DC
Start: 2024-01-19 — End: 2024-02-17
  Filled 2024-01-19: qty 2, 28d supply, fill #0

## 2024-01-19 MED ORDER — CYANOCOBALAMIN 500 MCG PO TABS: 500.0000 ug | ORAL_TABLET | Freq: Every day | ORAL | Status: DC

## 2024-01-19 NOTE — Progress Notes (Signed)
Dawn Downs, D.O.  ABFM, ABOM Specializing in Clinical Bariatric Medicine  Office located at: [address] [place], [place]  [address]   Assessment and Plan:   FOR THE DISEASE OF OBESITY: BMI 40.0-44.9, adult (HCC)-CURRENT 37.23 Morbidly obese (HCC)-START BMI 44.29 Assessment & Plan: Since last office visit on 12/25/23 patient's muscle mass has decreased by 0.4 lb. Fat mass has increased by 1.2 lb. Total body water has decreased by 1.2 lb.  Counseling done on how various foods will affect these numbers and how to maximize success  Total lbs lost to date: 27 lbs  Total weight loss percentage to date: 14.34%    Recommended Dietary Goals [name] is currently in the action stage of change. As such, her goal is to continue weight management plan.  She has agreed to: continue current plan   Behavioral Intervention We discussed the following today: increasing lean protein intake to established goals and work on meal planning and preparation  Additional resources provided today: None  Evidence-based interventions for health behavior change were utilized today including the discussion of self monitoring techniques, problem-solving barriers and SMART goal setting techniques.   Regarding patient's less desirable eating habits and patterns, we employed the technique of small changes.   Pt will specifically work on: meal planning/prepping at least 1 day a week.   Recommended Physical Activity Goals Dorsey has been advised to work up to 150 minutes of moderate intensity aerobic activity a week and strengthening exercises 2-3 times per week for cardiovascular health, weight loss maintenance and preservation of muscle mass.   She has agreed to : work on moving more.   Pharmacotherapy We both agreed to : continue with nutritional and behavioral strategies and continue current anti-obesity medication regimen   FOR ASSOCIATED CONDITIONS ADDRESSED TODAY:  Type 2 diabetes  mellitus with obesity (HCC) Assessment & Plan: T2DM well controlled on Metformin 500 mg twice daily and Mounjaro 12.5 mg weekly. She is tolerating both medications well. She denies symptoms of hypoglycemia or hyperglycemia. Her hunger and cravings are stable when following her prudent nutritional plan. Maintain with current medication regimen; consider increasing Mounjaro in the future. Continue weight loss therapy. Focus on increasing protein intake as always.  Orders -     Tirzepatide; Inject 12.5 mg into the skin once a week.  Dispense: 2 mL; Refill: 0   Mood disorder (HCC) with emotional eating Assessment & Plan: PCP recently increased pt's Wellbutrin to 450 mg daily on 12/30/23 due to increased work stress. Pt has not noticed a difference on this dose, but she endorses that work dynamics are improving. Continue current medication regimen- pt advised to take her Wellbutrin early in the day to prevent trouble sleeping. Continue weight loss therapy.    B12 deficiency due to diet Assessment & Plan: LOV, Dr.Beasley instructed pt to start OTC B12 supplement 500 mcg daily; pt has not yet started it. Pt again advised to start OTC B12 500 mcg daily. Continue to focus on B12 rich foods. Recheck levels in 3 months.   Orders: -     Cyanocobalamin; Take 1 tablet (500 mcg total) by mouth daily.  Follow up:   Return in about 4 weeks (around 02/16/2024), or 02/17/2024. She was informed of the importance of frequent follow up visits to maximize her success with intensive lifestyle modifications for her multiple health conditions.  Subjective:   Chief complaint: Obesity Dawn Downs is here to discuss her progress with her obesity treatment plan. She is on  the Category 3 Plan and states she is following her eating plan approximately 60% of the time. She states she is not exercising.  Interval History:  Dawn Downs is here for a follow up office visit.  Off scale win: pt's weight has not increased  despite the holiday period and other celebration eating events. This year she would specifically like to be more intentional about exercise and meal planning/prepping more. Work wise, she is really enjoying what she's doing at this time.   Pharmacotherapy for weight loss: She is currently taking  Metformin XR 500 mg twice daily, Mounjaro 12.5 mg weekly, and Wellbutrin XL 450 mg daily  .   Review of Systems:  Pertinent positives were addressed with patient today.  Reviewed by clinician on day of visit: allergies, medications, problem list, medical history, surgical history, family history, social history, and previous encounter notes.  Weight Summary and Biometrics   Weight Lost Since Last Visit: 0  Weight Gained Since Last Visit: 0    Vitals Temp: 97.7 F (36.5 C) BP: 131/79 Pulse Rate: 83 SpO2: 100 %   Anthropometric Measurements Height: 5\' 4"  (1.626 m) Weight: 221 lb (100.2 kg) BMI (Calculated): 37.92 Weight at Last Visit: 221 lb Weight Lost Since Last Visit: 0 Weight Gained Since Last Visit: 0 Starting Weight: 258 lb Total Weight Loss (lbs): 37 lb (16.8 kg) Peak Weight: 281 lb   Body Composition  Body Fat %: 44.5 % Fat Mass (lbs): 98.8 lbs Muscle Mass (lbs): 116.8 lbs Total Body Water (lbs): 81.2 lbs Visceral Fat Rating : 12   Other Clinical Data Fasting: No Labs: No Today's Visit #: 33 Starting Date: 09/20/21   Objective:   PHYSICAL EXAM: Blood pressure 131/79, pulse 83, temperature 97.7 F (36.5 C), height 5\' 4"  (1.626 m), weight 221 lb (100.2 kg), SpO2 100%. Body mass index is 37.93 kg/m.  General: she is overweight, cooperative and in no acute distress. PSYCH: Has normal mood, affect and thought process.   HEENT: EOMI, sclerae are anicteric. Lungs: Normal breathing effort, no conversational dyspnea. Extremities: Moves * 4 Neurologic: A and O * 3, good insight  DIAGNOSTIC DATA REVIEWED: BMET    Component Value Date/Time   NA 144 12/03/2023  0817   K 4.5 12/03/2023 0817   CL 106 12/03/2023 0817   CO2 22 12/03/2023 0817   GLUCOSE 105 (H) 12/03/2023 0817   GLUCOSE 101 (H) 06/26/2023 0806   BUN 20 12/03/2023 0817   CREATININE 0.71 12/03/2023 0817   CREATININE 0.53 12/21/2020 1043   CALCIUM 9.4 12/03/2023 0817   GFRNONAA >60 11/10/2009 1045   GFRAA  11/10/2009 1045    >60        The eGFR has been calculated using the MDRD equation. This calculation has not been validated in all clinical situations. eGFR's persistently <60 mL/min signify possible Chronic Kidney Disease.   Lab Results  Component Value Date   HGBA1C 5.5 12/03/2023   HGBA1C  05/31/2009    5.1 (NOTE) The ADA recommends the following therapeutic goal for glycemic control related to Hgb A1c measurement: Goal of therapy: <6.5 Hgb A1c  Reference: American Diabetes Association: Clinical Practice Recommendations 2010, Diabetes Care, 2010, 33: (Suppl  1).   Lab Results  Component Value Date   INSULIN 27.9 (H) 09/20/2021   Lab Results  Component Value Date   TSH 2.16 06/26/2023   CBC    Component Value Date/Time   WBC 9.7 06/26/2023 0806   RBC 5.00 06/26/2023 0806  HGB 13.2 06/26/2023 0806   HCT 40.4 06/26/2023 0806   PLT 313.0 06/26/2023 0806   MCV 80.8 06/26/2023 0806   MCH 26.5 (L) 12/21/2020 1043   MCHC 32.6 06/26/2023 0806   RDW 15.5 06/26/2023 0806   Iron Studies No results found for: "IRON", "TIBC", "FERRITIN", "IRONPCTSAT" Lipid Panel     Component Value Date/Time   CHOL 201 (H) 12/03/2023 0817   TRIG 50 12/03/2023 0817   HDL 50 12/03/2023 0817   CHOLHDL 4.0 12/03/2023 0817   CHOLHDL 4 06/26/2023 0806   VLDL 10.8 06/26/2023 0806   LDLCALC 142 (H) 12/03/2023 0817   LDLCALC 127 (H) 12/21/2020 1043   Hepatic Function Panel     Component Value Date/Time   PROT 7.2 12/03/2023 0817   ALBUMIN 4.6 12/03/2023 0817   AST 11 12/03/2023 0817   ALT 13 12/03/2023 0817   ALKPHOS 79 12/03/2023 0817   BILITOT 0.2 12/03/2023 0817   BILIDIR  0.10 12/03/2023 0817   IBILI 0.3 12/21/2020 1043      Component Value Date/Time   TSH 2.16 06/26/2023 0806   Nutritional Lab Results  Component Value Date   VD25OH 89.7 12/03/2023   VD25OH >120 06/26/2023   VD25OH 91.2 01/23/2023    Attestations:   I, Special Puri, acting as a Stage manager for [employer], DO., have compiled all relevant documentation for today's office visit on behalf of Thomasene Lot, DO, while in the presence of [employer], DO.  Reviewed by clinician on day of visit: allergies, medications, problem list, medical history, surgical history, family history, social history, and previous encounter notes pertinent to patient's obesity diagnosis. I have spent 40 minutes in the care of the patient today including: preparing to see patient (e.g. review and interpretation of tests, old notes ), obtaining and/or reviewing separately obtained history, performing a medically appropriate examination or evaluation, counseling and educating the patient, ordering medications, test or procedures, documenting clinical information in the electronic or other health care record, and independently interpreting results and communicating results to the patient, family, or caregiver   I have reviewed the above documentation for accuracy and completeness, and I agree with the above. Dawn Downs, D.O.  The 21st Century Cures Act was signed into law in 2016 which includes the topic of electronic health records.  This provides immediate access to information in MyChart.  This includes consultation notes, operative notes, office notes, lab results and pathology reports.  If you have any questions about what you read please let us know at your next visit so we can discuss your concerns and take corrective action if need be.  We are right here with you.

## 2024-01-21 ENCOUNTER — Ambulatory Visit: Payer: 59 | Admitting: Podiatry

## 2024-01-28 ENCOUNTER — Other Ambulatory Visit (HOSPITAL_BASED_OUTPATIENT_CLINIC_OR_DEPARTMENT_OTHER): Payer: Self-pay

## 2024-02-17 ENCOUNTER — Encounter (INDEPENDENT_AMBULATORY_CARE_PROVIDER_SITE_OTHER): Payer: Self-pay | Admitting: Family Medicine

## 2024-02-17 ENCOUNTER — Other Ambulatory Visit (HOSPITAL_BASED_OUTPATIENT_CLINIC_OR_DEPARTMENT_OTHER): Payer: Self-pay

## 2024-02-17 ENCOUNTER — Ambulatory Visit (INDEPENDENT_AMBULATORY_CARE_PROVIDER_SITE_OTHER): Payer: 59 | Admitting: Family Medicine

## 2024-02-17 VITALS — BP 125/84 | HR 98 | Temp 98.1°F | Ht 64.0 in | Wt 214.0 lb

## 2024-02-17 DIAGNOSIS — F5089 Other specified eating disorder: Secondary | ICD-10-CM

## 2024-02-17 DIAGNOSIS — E538 Deficiency of other specified B group vitamins: Secondary | ICD-10-CM

## 2024-02-17 DIAGNOSIS — F39 Unspecified mood [affective] disorder: Secondary | ICD-10-CM

## 2024-02-17 DIAGNOSIS — E1169 Type 2 diabetes mellitus with other specified complication: Secondary | ICD-10-CM

## 2024-02-17 DIAGNOSIS — Z6841 Body Mass Index (BMI) 40.0 and over, adult: Secondary | ICD-10-CM

## 2024-02-17 DIAGNOSIS — Z6836 Body mass index (BMI) 36.0-36.9, adult: Secondary | ICD-10-CM

## 2024-02-17 DIAGNOSIS — Z7985 Long-term (current) use of injectable non-insulin antidiabetic drugs: Secondary | ICD-10-CM

## 2024-02-17 DIAGNOSIS — Z7984 Long term (current) use of oral hypoglycemic drugs: Secondary | ICD-10-CM

## 2024-02-17 MED ORDER — LANCET DEVICE MISC
1.0000 | Freq: Three times a day (TID) | 0 refills | Status: AC
  Filled 2024-02-17: qty 1, 30d supply, fill #0

## 2024-02-17 MED ORDER — BLOOD GLUCOSE MONITOR SYSTEM W/DEVICE KIT
1.0000 | PACK | Freq: Three times a day (TID) | 0 refills | Status: AC
  Filled 2024-02-17: qty 1, 30d supply, fill #0

## 2024-02-17 MED ORDER — TIRZEPATIDE 12.5 MG/0.5ML ~~LOC~~ SOAJ
12.5000 mg | SUBCUTANEOUS | 0 refills | Status: DC
  Filled 2024-02-17 – 2024-03-26 (×2): qty 2, 28d supply, fill #0

## 2024-02-17 MED ORDER — BLOOD GLUCOSE TEST VI STRP
1.0000 | ORAL_STRIP | Freq: Three times a day (TID) | 0 refills | Status: AC
  Filled 2024-02-17: qty 100, 30d supply, fill #0

## 2024-02-17 MED ORDER — CYANOCOBALAMIN 500 MCG PO TABS: 500.0000 ug | ORAL_TABLET | Freq: Every day | ORAL | Status: AC

## 2024-02-17 MED ORDER — ACCU-CHEK SOFTCLIX LANCETS MISC
1.0000 | Freq: Three times a day (TID) | 0 refills | Status: AC
  Filled 2024-02-17: qty 100, 30d supply, fill #0

## 2024-02-17 NOTE — Progress Notes (Signed)
Dawn Downs, D.O.  ABFM, ABOM Specializing in Clinical Bariatric Medicine  Office located at: [address] [place], [place]  [address]   Assessment and Plan:   FOR THE DISEASE OF OBESITY: BMI 40.0-44.9, adult (HCC)-CURRENT 36.72 Morbidly obese (HCC)-START BMI 44.29 Assessment & Plan: Since last office visit on 01/19/2024 patient's  Muscle mass has decreased by 0.2 lb. Fat mass has decreased by 6.8 lb. Total body water has decreased by 3.6 lb.  Counseling done on how various foods will affect these numbers and how to maximize success  Total lbs lost to date: 44 lbs  Total weight loss percentage to date: 17.05%    Recommended Dietary Goals Dawn Downs is currently in the action stage of change. As such, her goal is to continue weight management plan.  She has agreed to: switch to Category 2 MP .   Pt's RMR has not been repeated since 2022 but has likely changed given her TWL of 44 lbs. Additionally, she has trouble getting in all the food on the CAT 3 MP - especially the 8 ounces of lean protein at dinner. Therefore the change in MP was made.   Behavioral Intervention We discussed the following today: moving proteins from dinner meal to lunch, increasing lean protein intake to established goals and decreasing simple carbohydrates   Additional resources provided today: handout on CAT 2 meal plan  Evidence-based interventions for health behavior change were utilized today including the discussion of self monitoring techniques, problem-solving barriers and SMART goal setting techniques.   Regarding patient's less desirable eating habits and patterns, we employed the technique of small changes.   Pt will specifically work on: n/a   Recommended Physical Activity Goals Damyra has been advised to work up to 150 minutes of moderate intensity aerobic activity a week and strengthening exercises 2-3 times per week for cardiovascular health, weight loss maintenance and  preservation of muscle mass.   She has agreed to : Think about enjoyable ways to increase daily physical activity and overcoming barriers to exercise   Pharmacotherapy We both agreed to : continue with nutritional and behavioral strategies & continue medication regimen.    FOR ASSOCIATED CONDITIONS ADDRESSED TODAY:  Type 2 diabetes mellitus with obesity (HCC) Assessment & Plan: T2DM treated with Mounjaro 12.5 mg weekly & Metformin XR 500 mg daily (pt endorses taking one tablet daily of Metformin because two tablets leads to GI upset). Her Hemoglobin A1c is well controlled at 5.5. She does not have a way to check her blood sugars at home, but denies symptoms of hypoglycemia or hyperglycemia. Cravings/hunger well controlled. Continue medication regimen. Will order generic glucometer today. Continue weight loss therapy.   Orders:  -     Blood Glucose Monitoring Suppl; 1 each by Does not apply route in the morning, at noon, and at bedtime. May substitute to any manufacturer covered by patient's insurance.  Dispense: 1 each; Refill: 0 -     Blood Glucose Test; 1 each by In Vitro route in the morning, at noon, and at bedtime. May substitute to any manufacturer covered by patient's insurance.  Dispense: 90 each; Refill: 0 -     Lancet Device; 1 each by Does not apply route in the morning, at noon, and at bedtime. May substitute to any manufacturer covered by patient's insurance.  Dispense: 1 each; Refill: 0 -     Lancets Misc.; 1 each by Does not apply route in the morning, at noon, and at bedtime. May substitute  to any manufacturer covered by patient's insurance.  Dispense: 100 each; Refill: 0 -     Tirzepatide; Inject 12.5 mg into the skin once a week.  Dispense: 2 mL; Refill: 0   Mood disorder (HCC) with emotional eating Assessment & Plan: Relevant med: Wellbutrin XL 450 mg daily - pt denies SE. Denies hx of seizure disorder. Mood is stable. She endorses that her cravings are controlled and is  doing less emotional eating. Continue med regimen and RCNP. Will continue to monitor condition.    B12 deficiency due to diet Assessment & Plan: Pt is doing well on OTC B12 500 mcg daily. Continue regimen. Recheck periodically.  Orders:  -     Cyanocobalamin; Take 1 tablet (500 mcg total) by mouth daily.   Follow up:   Return 03/10/2024. She was informed of the importance of frequent follow up visits to maximize her success with intensive lifestyle modifications for her multiple health conditions.  Subjective:   Chief complaint: Obesity [name] is here to discuss her progress with her obesity treatment plan. She is on the Category 3 Plan and states she is following her eating plan approximately 50% of the time. She states she is not exercising.  Interval History:  [name] is here for a follow up office visit. Since last OV on 01/19/2024, Dawn Downs is down 7 lbs. Pt meal prepping on Sundays. Has been skipping less meals & avoiding "off-plan" foods. She finds it difficult to get in all her protein during dinner. Pt trying to be more intentional about walking more at work. She started the OTC B12 and is tolerating it well.   Pharmacotherapy for weight loss: She is currently taking  Metformin XR 500 mg once daily, Mounjaro 12.5 mg weekly, and Wellbutrin XL 450 mg daily.  Review of Systems:  Pertinent positives were addressed with patient today.  Reviewed by clinician on day of visit: allergies, medications, problem list, medical history, surgical history, family history, social history, and previous encounter notes.  Weight Summary and Biometrics   Weight Lost Since Last Visit: 7  Weight Gained Since Last Visit: 0    Vitals Temp: 98.1 F (36.7 C) BP: 125/84 Pulse Rate: 98 SpO2: 99 %   Anthropometric Measurements Height: 5\' 4"  (1.626 m) Weight: 214 lb (97.1 kg) BMI (Calculated): 36.72 Weight at Last Visit: 221 lb Weight Lost Since Last Visit: 7 Weight Gained  Since Last Visit: 0 Starting Weight: 258 lb Total Weight Loss (lbs): 44 lb (20 kg) Peak Weight: 281 lb   Body Composition  Body Fat %: 42.8 % Fat Mass (lbs): 92 lbs Muscle Mass (lbs): 116.6 lbs Total Body Water (lbs): 77.6 lbs Visceral Fat Rating : 11   Other Clinical Data Fasting: yes Labs: no Today's Visit #: 34 Starting Date: 09/20/21   Objective:   PHYSICAL EXAM: Blood pressure 125/84, pulse 98, temperature 98.1 F (36.7 C), height 5\' 4"  (1.626 m), weight 214 lb (97.1 kg), SpO2 99%. Body mass index is 36.73 kg/m.  General: she is overweight, cooperative and in no acute distress. PSYCH: Has normal mood, affect and thought process.   HEENT: EOMI, sclerae are anicteric. Lungs: Normal breathing effort, no conversational dyspnea. Extremities: Moves * 4 Neurologic: A and O * 3, good insight  DIAGNOSTIC DATA REVIEWED: BMET    Component Value Date/Time   NA 144 12/03/2023 0817   K 4.5 12/03/2023 0817   CL 106 12/03/2023 0817   CO2 22 12/03/2023 0817   GLUCOSE 105 (H) 12/03/2023  1478   GLUCOSE 101 (H) 06/26/2023 0806   BUN 20 12/03/2023 0817   CREATININE 0.71 12/03/2023 0817   CREATININE 0.53 12/21/2020 1043   CALCIUM 9.4 12/03/2023 0817   GFRNONAA >60 11/10/2009 1045   GFRAA  11/10/2009 1045    >60        The eGFR has been calculated using the MDRD equation. This calculation has not been validated in all clinical situations. eGFR's persistently <60 mL/min signify possible Chronic Kidney Disease.   Lab Results  Component Value Date   HGBA1C 5.5 12/03/2023   HGBA1C  05/31/2009    5.1 (NOTE) The ADA recommends the following therapeutic goal for glycemic control related to Hgb A1c measurement: Goal of therapy: <6.5 Hgb A1c  Reference: American Diabetes Association: Clinical Practice Recommendations 2010, Diabetes Care, 2010, 33: (Suppl  1).   Lab Results  Component Value Date   INSULIN 27.9 (H) 09/20/2021   Lab Results  Component Value Date   TSH  2.16 06/26/2023   CBC    Component Value Date/Time   WBC 9.7 06/26/2023 0806   RBC 5.00 06/26/2023 0806   HGB 13.2 06/26/2023 0806   HCT 40.4 06/26/2023 0806   PLT 313.0 06/26/2023 0806   MCV 80.8 06/26/2023 0806   MCH 26.5 (L) 12/21/2020 1043   MCHC 32.6 06/26/2023 0806   RDW 15.5 06/26/2023 0806   Iron Studies No results found for: "IRON", "TIBC", "FERRITIN", "IRONPCTSAT" Lipid Panel     Component Value Date/Time   CHOL 201 (H) 12/03/2023 0817   TRIG 50 12/03/2023 0817   HDL 50 12/03/2023 0817   CHOLHDL 4.0 12/03/2023 0817   CHOLHDL 4 06/26/2023 0806   VLDL 10.8 06/26/2023 0806   LDLCALC 142 (H) 12/03/2023 0817   LDLCALC 127 (H) 12/21/2020 1043   Hepatic Function Panel     Component Value Date/Time   PROT 7.2 12/03/2023 0817   ALBUMIN 4.6 12/03/2023 0817   AST 11 12/03/2023 0817   ALT 13 12/03/2023 0817   ALKPHOS 79 12/03/2023 0817   BILITOT 0.2 12/03/2023 0817   BILIDIR 0.10 12/03/2023 0817   IBILI 0.3 12/21/2020 1043      Component Value Date/Time   TSH 2.16 06/26/2023 0806   Nutritional Lab Results  Component Value Date   VD25OH 89.7 12/03/2023   VD25OH >120 06/26/2023   VD25OH 91.2 01/23/2023    Attestations:   I, Special Puri, acting as a Stage manager for [employer], DO., have compiled all relevant documentation for today's office visit on behalf of Thomasene Lot, DO, while in the presence of [employer], DO.  I have reviewed the above documentation for accuracy and completeness, and I agree with the above. Dawn Downs, D.O.  The 21st Century Cures Act was signed into law in 2016 which includes the topic of electronic health records.  This provides immediate access to information in MyChart.  This includes consultation notes, operative notes, office notes, lab results and pathology reports.  If you have any questions about what you read please let us know at your next visit so we can discuss your concerns and take corrective action  if need be.  We are right here with you.

## 2024-02-24 ENCOUNTER — Other Ambulatory Visit (HOSPITAL_BASED_OUTPATIENT_CLINIC_OR_DEPARTMENT_OTHER): Payer: Self-pay

## 2024-03-01 ENCOUNTER — Other Ambulatory Visit (HOSPITAL_BASED_OUTPATIENT_CLINIC_OR_DEPARTMENT_OTHER): Payer: Self-pay

## 2024-03-10 ENCOUNTER — Ambulatory Visit (INDEPENDENT_AMBULATORY_CARE_PROVIDER_SITE_OTHER): Payer: 59 | Admitting: Family Medicine

## 2024-03-26 ENCOUNTER — Other Ambulatory Visit (HOSPITAL_BASED_OUTPATIENT_CLINIC_OR_DEPARTMENT_OTHER): Payer: Self-pay

## 2024-03-26 ENCOUNTER — Other Ambulatory Visit: Payer: Self-pay

## 2024-03-31 ENCOUNTER — Ambulatory Visit (INDEPENDENT_AMBULATORY_CARE_PROVIDER_SITE_OTHER): Payer: 59 | Admitting: Family Medicine

## 2024-03-31 ENCOUNTER — Encounter (INDEPENDENT_AMBULATORY_CARE_PROVIDER_SITE_OTHER): Payer: Self-pay | Admitting: Family Medicine

## 2024-03-31 ENCOUNTER — Other Ambulatory Visit (HOSPITAL_BASED_OUTPATIENT_CLINIC_OR_DEPARTMENT_OTHER): Payer: Self-pay

## 2024-03-31 VITALS — BP 128/85 | HR 83 | Temp 97.6°F | Ht 64.0 in | Wt 216.0 lb

## 2024-03-31 DIAGNOSIS — E7849 Other hyperlipidemia: Secondary | ICD-10-CM

## 2024-03-31 DIAGNOSIS — F39 Unspecified mood [affective] disorder: Secondary | ICD-10-CM

## 2024-03-31 DIAGNOSIS — F5089 Other specified eating disorder: Secondary | ICD-10-CM | POA: Diagnosis not present

## 2024-03-31 DIAGNOSIS — E669 Obesity, unspecified: Secondary | ICD-10-CM

## 2024-03-31 DIAGNOSIS — Z6837 Body mass index (BMI) 37.0-37.9, adult: Secondary | ICD-10-CM

## 2024-03-31 DIAGNOSIS — Z7985 Long-term (current) use of injectable non-insulin antidiabetic drugs: Secondary | ICD-10-CM

## 2024-03-31 DIAGNOSIS — E538 Deficiency of other specified B group vitamins: Secondary | ICD-10-CM

## 2024-03-31 DIAGNOSIS — E1169 Type 2 diabetes mellitus with other specified complication: Secondary | ICD-10-CM | POA: Diagnosis not present

## 2024-03-31 DIAGNOSIS — Z6841 Body Mass Index (BMI) 40.0 and over, adult: Secondary | ICD-10-CM

## 2024-03-31 LAB — HM DIABETES EYE EXAM

## 2024-03-31 MED ORDER — TIRZEPATIDE 12.5 MG/0.5ML ~~LOC~~ SOAJ
12.5000 mg | SUBCUTANEOUS | 1 refills | Status: DC
  Filled 2024-03-31: qty 2, 28d supply, fill #0
  Filled 2024-04-24: qty 2, 28d supply, fill #1

## 2024-03-31 NOTE — Progress Notes (Signed)
 [name], D.O.  ABFM, ABOM Specializing in Clinical Bariatric Medicine  Office located at: [address] [place], [place]  [address]   Assessment and Plan:  No orders of the defined types were placed in this encounter.   Medications Discontinued During This Encounter  Medication Reason   tirzepatide [facility]) 12.5 MG/0.5ML Pen Reorder     Meds ordered this encounter  Medications   tirzepatide (MOUNJARO) 12.5 MG/0.5ML Pen    Sig: Inject 12.5 mg into the skin once a week.    Dispense:  2 mL    Refill:  1   Pt will obtain fasting labs during next OV.   FOR THE DISEASE OF OBESITY:  BMI 40.0-44.9, adult (HCC)-CURRENT 37.06 Morbidly obese (HCC)-START BMI 44.29 Assessment & Plan: Since last office visit on 02/17/2024, patient's muscle mass has increased by 0.4 lbs. Fat mass has increased by 1.6 lbs. Total body water has increased by 3.4 lbs.  Counseling done on how various foods will affect these numbers and how to maximize success  Total lbs lost to date: 42 lbs Total weight loss percentage to date: 16.28%    Recommended Dietary Goals Dawn Downs is currently in the action stage of change. As such, her goal is to continue weight management plan.  She has agreed to: continue current plan   Behavioral Intervention We discussed the following today: Cardio drumming, work on managing stress, creating time for self-care and relaxation  Additional resources provided today: Handout on Adaptive Thermogenesis  Evidence-based interventions for health behavior change were utilized today including the discussion of self monitoring techniques, problem-solving barriers and SMART goal setting techniques.   Regarding patient's less desirable eating habits and patterns, we employed the technique of small changes.   Pt will specifically work on: Incorporating intentional exercise for next visit.    Recommended Physical Activity Goals Dawn Downs has been advised to work up to  150 minutes of moderate intensity aerobic activity a week and strengthening exercises 2-3 times per week for cardiovascular health, weight loss maintenance and preservation of muscle mass.   She has agreed to :  Think about enjoyable ways to increase daily physical activity and overcoming barriers to exercise   Pharmacotherapy We both agreed to : continue with nutritional and behavioral strategies and current medication regimen   FOR ASSOCIATED CONDITIONS ADDRESSED TODAY:  Type 2 diabetes mellitus with obesity (HCC) Assessment & Plan: Dawn Downs is on Metformin XR 500 mg twice daily and Mounjaro 12.5 mg once weekly. Pt is tolerating both medicines well, no GI upset. She was given a blood glucometer during LOV which she reports just recently starting. Also, her MP was changed from CAT 3 to CAT 2 and she finds this change more manageable. Continue following prudent nutritional plan and prioritizing lean protein in diet. Continue current medication regimen. Will continue to monitor condition closely.   Relevant Orders: -     Tirzepatide; Inject 12.5 mg into the skin once a week.  Dispense: 2 mL; Refill: 1   Mood disorder (HCC) with emotional eating Assessment & Plan: Dawn Downs is taking Wellbutrin XL 450 mg once daily. She reports the medication has significantly improved her mood and cravings. Hunger/cravings well controlled, mood currently stable. Pt reports she has been dealing with situational stressors. For stress relief she is meditating and doing bible study in the mornings. Encouraged pt to also increase physical activity daily for stress management. Incorporate meditation into nightly routine as well. Continue medication at current dose. Will monitor condition  closely.    Other hyperlipidemia Assessment & Plan: Lab Results  Component Value Date   CHOL 201 (H) 12/03/2023   HDL 50 12/03/2023   LDLCALC 142 (H) 12/03/2023   TRIG 50 12/03/2023   CHOLHDL 4.0 12/03/2023  Dawn Downs is  taking Crestor 10 mg daily. Per last obtained labs, her LDL was elevated. Encouraged pt to continue decreasing saturated/trans fats and fried foods in diet. Will recheck levels during next OV.   B12 deficiency due to diet Assessment & Plan: Dawn Downs takes Cyanocobalamin 500 mcg daily for her B12 deficiency. Tolerating well, no adverse SE. No acute concerns today. Will continue to monitor.   Follow up:   Return in about 26 days (around 04/26/2024) for fasting labs: no caffeine, no exercise, and no sex. She was informed of the importance of frequent follow up visits to maximize her success with intensive lifestyle modifications for her multiple health conditions.  Subjective:   Chief complaint: Obesity Dawn Downs is here to discuss her progress with her obesity treatment plan. She is on the the Category 2 Plan and states she is following her eating plan approximately 50% of the time. She states she is not exercising.  Interval History: Dawn Downs is here for a follow up office visit. Since last OV on 02/17/2024, [name] is down 2 lbs. Since switching her MP, she likes the CAT 2 much better. She has been using it as more of a guideline and not being too strict on herself. Pt mentions that she was supposed to come in sooner but unfortunately got a flat tire prior to appointment.   Pharmacotherapy for weight loss: She is currently taking Metformin XR 500 mg twice daily, Wellbutrin XL 450 mg once daily, and Mounjaro 12.5 mg once weekly.   Review of Systems:  Pertinent positives were addressed with patient today.  Reviewed by clinician on day of visit: allergies, medications, problem list, medical history, surgical history, family history, social history, and previous encounter notes.  Weight Summary and Biometrics   Weight Lost Since Last Visit: 0  Weight Gained Since Last Visit: 2lb   Vitals Temp: 97.6 F (36.4 C) BP: 128/85 Pulse Rate: 83 SpO2: 98 %   Anthropometric  Measurements Height: 5\' 4"  (1.626 m) Weight: 216 lb (98 kg) BMI (Calculated): 37.06 Weight at Last Visit: 214lb Weight Lost Since Last Visit: 0 Weight Gained Since Last Visit: 2lb Starting Weight: 258lb Total Weight Loss (lbs): 42 lb (19.1 kg) Peak Weight: 281lb   Body Composition  Body Fat %: 43.2 % Fat Mass (lbs): 93.6 lbs Muscle Mass (lbs): 117 lbs Total Body Water (lbs): 81 lbs Visceral Fat Rating : 12   Other Clinical Data Fasting: yes Labs: no Today's Visit #: 35 Starting Date: 09/20/21    Objective:   PHYSICAL EXAM: Blood pressure 128/85, pulse 83, temperature 97.6 F (36.4 C), height 5\' 4"  (1.626 m), weight 216 lb (98 kg), SpO2 98%. Body mass index is 37.08 kg/m.  General: she is overweight, cooperative and in no acute distress. PSYCH: Has normal mood, affect and thought process.   HEENT: EOMI, sclerae are anicteric. Lungs: Normal breathing effort, no conversational dyspnea. Extremities: Moves * 4 Neurologic: A and O * 3, good insight  DIAGNOSTIC DATA REVIEWED: BMET    Component Value Date/Time   NA 144 12/03/2023 0817   K 4.5 12/03/2023 0817   CL 106 12/03/2023 0817   CO2 22 12/03/2023 0817   GLUCOSE 105 (H) 12/03/2023 0817   GLUCOSE 101 (H) 06/26/2023  0806   BUN 20 12/03/2023 0817   CREATININE 0.71 12/03/2023 0817   CREATININE 0.53 12/21/2020 1043   CALCIUM 9.4 12/03/2023 0817   GFRNONAA >60 11/10/2009 1045   GFRAA  11/10/2009 1045    >60        The eGFR has been calculated using the MDRD equation. This calculation has not been validated in all clinical situations. eGFR's persistently <60 mL/min signify possible Chronic Kidney Disease.   Lab Results  Component Value Date   HGBA1C 5.5 12/03/2023   HGBA1C  05/31/2009    5.1 (NOTE) The ADA recommends the following therapeutic goal for glycemic control related to Hgb A1c measurement: Goal of therapy: <6.5 Hgb A1c  Reference: American Diabetes Association: Clinical Practice  Recommendations 2010, Diabetes Care, 2010, 33: (Suppl  1).   Lab Results  Component Value Date   INSULIN 27.9 (H) 09/20/2021   Lab Results  Component Value Date   TSH 2.16 06/26/2023   CBC    Component Value Date/Time   WBC 9.7 06/26/2023 0806   RBC 5.00 06/26/2023 0806   HGB 13.2 06/26/2023 0806   HCT 40.4 06/26/2023 0806   PLT 313.0 06/26/2023 0806   MCV 80.8 06/26/2023 0806   MCH 26.5 (L) 12/21/2020 1043   MCHC 32.6 06/26/2023 0806   RDW 15.5 06/26/2023 0806   Iron Studies No results found for: "IRON", "TIBC", "FERRITIN", "IRONPCTSAT" Lipid Panel     Component Value Date/Time   CHOL 201 (H) 12/03/2023 0817   TRIG 50 12/03/2023 0817   HDL 50 12/03/2023 0817   CHOLHDL 4.0 12/03/2023 0817   CHOLHDL 4 06/26/2023 0806   VLDL 10.8 06/26/2023 0806   LDLCALC 142 (H) 12/03/2023 0817   LDLCALC 127 (H) 12/21/2020 1043   Hepatic Function Panel     Component Value Date/Time   PROT 7.2 12/03/2023 0817   ALBUMIN 4.6 12/03/2023 0817   AST 11 12/03/2023 0817   ALT 13 12/03/2023 0817   ALKPHOS 79 12/03/2023 0817   BILITOT 0.2 12/03/2023 0817   BILIDIR 0.10 12/03/2023 0817   IBILI 0.3 12/21/2020 1043      Component Value Date/Time   TSH 2.16 06/26/2023 0806   Nutritional Lab Results  Component Value Date   VD25OH 89.7 12/03/2023   VD25OH >120 06/26/2023   VD25OH 91.2 01/23/2023    Attestations:   I, Camryn Mix, acting as a Stage manager for [employer], DO., have compiled all relevant documentation for today's office visit on behalf of Thomasene Lot, DO, while in the presence of [employer], DO.  I have reviewed the above documentation for accuracy and completeness, and I agree with the above. [name], D.O.  The 21st Century Cures Act was signed into law in 2016 which includes the topic of electronic health records.  This provides immediate access to information in MyChart.  This includes consultation notes, operative notes, office notes, lab  results and pathology reports.  If you have any questions about what you read please let us know at your next visit so we can discuss your concerns and take corrective action if need be.  We are right here with you.

## 2024-04-26 ENCOUNTER — Ambulatory Visit (INDEPENDENT_AMBULATORY_CARE_PROVIDER_SITE_OTHER): Admitting: Adult Health

## 2024-04-26 ENCOUNTER — Encounter (INDEPENDENT_AMBULATORY_CARE_PROVIDER_SITE_OTHER): Payer: Self-pay | Admitting: Adult Health

## 2024-04-26 ENCOUNTER — Other Ambulatory Visit (HOSPITAL_BASED_OUTPATIENT_CLINIC_OR_DEPARTMENT_OTHER): Payer: Self-pay

## 2024-04-26 VITALS — BP 134/79 | HR 98 | Temp 98.2°F | Ht 64.0 in | Wt 220.0 lb

## 2024-04-26 DIAGNOSIS — E559 Vitamin D deficiency, unspecified: Secondary | ICD-10-CM | POA: Diagnosis not present

## 2024-04-26 DIAGNOSIS — Z7984 Long term (current) use of oral hypoglycemic drugs: Secondary | ICD-10-CM

## 2024-04-26 DIAGNOSIS — Z6841 Body Mass Index (BMI) 40.0 and over, adult: Secondary | ICD-10-CM

## 2024-04-26 DIAGNOSIS — E538 Deficiency of other specified B group vitamins: Secondary | ICD-10-CM

## 2024-04-26 DIAGNOSIS — E1169 Type 2 diabetes mellitus with other specified complication: Secondary | ICD-10-CM

## 2024-04-26 DIAGNOSIS — Z6837 Body mass index (BMI) 37.0-37.9, adult: Secondary | ICD-10-CM

## 2024-04-26 DIAGNOSIS — E785 Hyperlipidemia, unspecified: Secondary | ICD-10-CM

## 2024-04-26 DIAGNOSIS — Z7985 Long-term (current) use of injectable non-insulin antidiabetic drugs: Secondary | ICD-10-CM

## 2024-04-26 DIAGNOSIS — E669 Obesity, unspecified: Secondary | ICD-10-CM

## 2024-04-26 MED ORDER — TAMSULOSIN HCL 0.4 MG PO CAPS
0.4000 mg | ORAL_CAPSULE | Freq: Every day | ORAL | 0 refills | Status: DC
  Filled 2024-04-26: qty 30, 30d supply, fill #0

## 2024-04-26 MED ORDER — HYDROCODONE-ACETAMINOPHEN 5-325 MG PO TABS
1.0000 | ORAL_TABLET | Freq: Four times a day (QID) | ORAL | 0 refills | Status: DC | PRN
  Filled 2024-04-26: qty 10, 3d supply, fill #0

## 2024-04-26 NOTE — Progress Notes (Signed)
 WEIGHT SUMMARY AND BIOMETRICS  Vitals Temp: 98.2 F (36.8 C) BP: 134/79 Pulse Rate: 98 SpO2: 97 %   Anthropometric Measurements Height: 5\' 4"  (1.626 m) Weight: 220 lb (99.8 kg) BMI (Calculated): 37.74 Weight at Last Visit: 214 lb Weight Lost Since Last Visit: 0 Weight Gained Since Last Visit: 4 lb Starting Weight: 258 lb Total Weight Loss (lbs): 38 lb (17.2 kg) Peak Weight: 281 lb   Body Composition  Body Fat %: 43.4 % Fat Mass (lbs): 95.4 lbs Muscle Mass (lbs): 118.4 lbs Total Body Water (lbs): 82.8 lbs Visceral Fat Rating : 12   Other Clinical Data Fasting: yes Labs: no Today's Visit #: 36 Starting Date: 09/20/21    Chief Complaint:   OBESITY Dawn Downs is here to discuss her progress with her obesity treatment plan.  She is on the the Category 2 Plan and states she is following her eating plan approximately 50 % of the time.  She states she is exercising: None due to Nephrolithiasis   Interim History:  She has been experiencing acute sx's r/t Nephrolithiasis  Interval Health Goals: 1) Avoid Nephrolithiasis 2) Resume exercise after acute kidney condition resolves  PCP manages Metformin  XR 500mg  BID HWW manages weekly Mounjaro  12.5mg  Denies mass in neck, dysphagia, dyspepsia, persistent hoarseness, abdominal pain, or N/V/C   Subjective:   1. Vitamin D  deficiency She has hx of elevated Vit D Levels She is not taking any Vit D supplementation at present   Latest Reference Range & Units 12/20/22 08:48 01/23/23 09:36 06/26/23 08:06 12/03/23 08:17  Vitamin D , 25-Hydroxy 30.0 - 100.0 ng/mL  91.2  89.7  VITD ng/mL 109.84 (HH)  >120   (HH): Data is critically high  2. Type 2 diabetes mellitus with obesity (HCC) Lab Results  Component Value Date   HGBA1C 5.5 12/03/2023   HGBA1C 5.3 06/26/2023   HGBA1C 5.4 03/18/2023    PCP manages Metformin  XR 500mg  BID HWW manages weekly Mounjaro  12.5mg  Denies mass in neck, dysphagia, dyspepsia, persistent  hoarseness, abdominal pain, or N/V/C  She has been checking home CBG She needs to meet with a Dietician, then her insurance will provide home glucometer She denies sx's of hypoglycemia  3. Hyperlipidemia associated with type 2 diabetes mellitus (HCC) Lipid Panel     Component Value Date/Time   CHOL 201 (H) 12/03/2023 0817   TRIG 50 12/03/2023 0817   HDL 50 12/03/2023 0817   CHOLHDL 4.0 12/03/2023 0817   CHOLHDL 4 06/26/2023 0806   VLDL 10.8 06/26/2023 0806   LDLCALC 142 (H) 12/03/2023 0817   LDLCALC 127 (H) 12/21/2020 1043   LABVLDL 9 12/03/2023 0817    PCP manges daily Crestor  10mg   4. B12 deficiency due to diet  Latest Reference Range & Units 12/03/23 08:17  Vitamin B12 232 - 1,245 pg/mL 271   HWW is managing daily Cyanocobalamin    Assessment/Plan:   1. Vitamin D  deficiency Check Labs - VITAMIN D  25 Hydroxy (Vit-D Deficiency, Fractures)  2. Type 2 diabetes mellitus with obesity (HCC) (Primary) Check Labs - Hemoglobin A1c - Insulin, random  3. Hyperlipidemia associated with type 2 diabetes mellitus (HCC) Check Labs - Comprehensive metabolic panel with GFR  4. B12 deficiency due to diet Check Labs - Vitamin B12  5. BMI 40.0-44.9, adult (HCC)-CURRENT 37.06  Dawn Downs is currently in the action stage of change. As such, her goal is to continue with weight loss efforts. She has agreed to the Category 2 Plan.   Exercise goals: No  exercise has been prescribed at this time.  Behavioral modification strategies: increasing lean protein intake, decreasing simple carbohydrates, increasing vegetables, increasing water intake, no skipping meals, meal planning and cooking strategies, keeping healthy foods in the home, ways to avoid boredom eating, and planning for success.  Dawn Downs has agreed to follow-up with our clinic in 4 weeks. She was informed of the importance of frequent follow-up visits to maximize her success with intensive lifestyle modifications for her  multiple health conditions.   Dawn Downs was informed we would discuss her lab results at her next visit unless there is a critical issue that needs to be addressed sooner. Dawn Downs agreed to keep her next visit at the agreed upon time to discuss these results.  Objective:   Blood pressure 134/79, pulse 98, temperature 98.2 F (36.8 C), height 5\' 4"  (1.626 m), weight 220 lb (99.8 kg), SpO2 97%. Body mass index is 37.76 kg/m.  General: Cooperative, alert, well developed, in no acute distress. HEENT: Conjunctivae and lids unremarkable. Cardiovascular: Regular rhythm.  Lungs: Normal work of breathing. Neurologic: No focal deficits.   Lab Results  Component Value Date   CREATININE 0.71 12/03/2023   BUN 20 12/03/2023   NA 144 12/03/2023   K 4.5 12/03/2023   CL 106 12/03/2023   CO2 22 12/03/2023   Lab Results  Component Value Date   ALT 13 12/03/2023   AST 11 12/03/2023   ALKPHOS 79 12/03/2023   BILITOT 0.2 12/03/2023   Lab Results  Component Value Date   HGBA1C 5.5 12/03/2023   HGBA1C 5.3 06/26/2023   HGBA1C 5.4 03/18/2023   HGBA1C 5.1 12/20/2022   HGBA1C 5.5 09/09/2022   Lab Results  Component Value Date   INSULIN 27.9 (H) 09/20/2021   Lab Results  Component Value Date   TSH 2.16 06/26/2023   Lab Results  Component Value Date   CHOL 201 (H) 12/03/2023   HDL 50 12/03/2023   LDLCALC 142 (H) 12/03/2023   TRIG 50 12/03/2023   CHOLHDL 4.0 12/03/2023   Lab Results  Component Value Date   VD25OH 89.7 12/03/2023   VD25OH >120 06/26/2023   VD25OH 91.2 01/23/2023   Lab Results  Component Value Date   WBC 9.7 06/26/2023   HGB 13.2 06/26/2023   HCT 40.4 06/26/2023   MCV 80.8 06/26/2023   PLT 313.0 06/26/2023   No results found for: "IRON", "TIBC", "FERRITIN"  Attestation Statements:   Reviewed by clinician on day of visit: allergies, medications, problem list, medical history, surgical history, family history, social history, and previous encounter notes.  I  have reviewed the above documentation for accuracy and completeness, and I agree with the above. -  Marlyn Tondreau d. Robbin Escher, NP-C

## 2024-04-27 LAB — HEMOGLOBIN A1C
Est. average glucose Bld gHb Est-mCnc: 111 mg/dL
Hgb A1c MFr Bld: 5.5 % (ref 4.8–5.6)

## 2024-04-27 LAB — COMPREHENSIVE METABOLIC PANEL WITH GFR
ALT: 12 IU/L (ref 0–32)
AST: 13 IU/L (ref 0–40)
Albumin: 4.4 g/dL (ref 3.9–4.9)
Alkaline Phosphatase: 90 IU/L (ref 44–121)
BUN/Creatinine Ratio: 27 — ABNORMAL HIGH (ref 9–23)
BUN: 18 mg/dL (ref 6–24)
Bilirubin Total: 0.3 mg/dL (ref 0.0–1.2)
CO2: 21 mmol/L (ref 20–29)
Calcium: 9.7 mg/dL (ref 8.7–10.2)
Chloride: 104 mmol/L (ref 96–106)
Creatinine, Ser: 0.67 mg/dL (ref 0.57–1.00)
Globulin, Total: 2.1 g/dL (ref 1.5–4.5)
Glucose: 82 mg/dL (ref 70–99)
Potassium: 4.6 mmol/L (ref 3.5–5.2)
Sodium: 145 mmol/L — ABNORMAL HIGH (ref 134–144)
Total Protein: 6.5 g/dL (ref 6.0–8.5)
eGFR: 108 mL/min/{1.73_m2} (ref >59)

## 2024-04-27 LAB — VITAMIN B12: Vitamin B-12: 266 pg/mL (ref 232–1245)

## 2024-04-27 LAB — VITAMIN D 25 HYDROXY (VIT D DEFICIENCY, FRACTURES): Vit D, 25-Hydroxy: 126 ng/mL — ABNORMAL HIGH (ref 30.0–100.0)

## 2024-04-27 LAB — INSULIN, RANDOM: INSULIN: 34 u[IU]/mL — ABNORMAL HIGH (ref 2.6–24.9)

## 2024-05-17 ENCOUNTER — Ambulatory Visit (INDEPENDENT_AMBULATORY_CARE_PROVIDER_SITE_OTHER): Admitting: Family Medicine

## 2024-05-17 ENCOUNTER — Other Ambulatory Visit (HOSPITAL_BASED_OUTPATIENT_CLINIC_OR_DEPARTMENT_OTHER): Payer: Self-pay

## 2024-05-17 ENCOUNTER — Encounter (INDEPENDENT_AMBULATORY_CARE_PROVIDER_SITE_OTHER): Payer: Self-pay | Admitting: Family Medicine

## 2024-05-17 VITALS — BP 126/76 | HR 86 | Temp 98.2°F | Ht 64.0 in | Wt 214.0 lb

## 2024-05-17 DIAGNOSIS — Z6836 Body mass index (BMI) 36.0-36.9, adult: Secondary | ICD-10-CM

## 2024-05-17 DIAGNOSIS — E1169 Type 2 diabetes mellitus with other specified complication: Secondary | ICD-10-CM | POA: Diagnosis not present

## 2024-05-17 DIAGNOSIS — E785 Hyperlipidemia, unspecified: Secondary | ICD-10-CM

## 2024-05-17 DIAGNOSIS — Z7985 Long-term (current) use of injectable non-insulin antidiabetic drugs: Secondary | ICD-10-CM

## 2024-05-17 MED ORDER — TIRZEPATIDE 12.5 MG/0.5ML ~~LOC~~ SOAJ
12.5000 mg | SUBCUTANEOUS | 1 refills | Status: DC
  Filled 2024-05-17 – 2024-05-21 (×2): qty 2, 28d supply, fill #0

## 2024-05-17 NOTE — Progress Notes (Signed)
 SUBJECTIVE:  Chief Complaint: Obesity  Interim History: Patient passed a kidney stone about 2 weeks ago.  She mentions this is the second stone she has passed.  She now has a urologist.  She has been able to eat more regularly since last appointment and since feeling better.  She mentions that eating more regularly has helped.    Dawn Downs is here to discuss her progress with her obesity treatment plan. She is on the Category 2 Plan and states she is following her eating plan approximately 50 % of the time. She states she is walking 45 minutes 1-3 times per week.   OBJECTIVE: Visit Diagnoses: Problem List Items Addressed This Visit       Endocrine   Hyperlipidemia associated with type 2 diabetes mellitus (HCC)   Has been more consistent with taking medication since last labs in December.  Will need to repeat labs with Dr. Paulla Bossier in July.  Continue crestor .      Relevant Medications   tirzepatide  (MOUNJARO ) 12.5 MG/0.5ML Pen   Type 2 diabetes mellitus with obesity (HCC) - Primary   Doing well on combination of metformin  and mounjaro .  No significant GI side effects on combo meds.  Needs refill Mounjaro  today.  Will stay at same dose today.  Labs with PCP in July.      Relevant Medications   tirzepatide  (MOUNJARO ) 12.5 MG/0.5ML Pen   Other Visit Diagnoses       Morbidly obese (HCC)-START BMI 44.29       Relevant Medications   tirzepatide  (MOUNJARO ) 12.5 MG/0.5ML Pen     BMI 36.0-36.9,adult           Vitals Temp: 98.2 F (36.8 C) BP: 126/76 Pulse Rate: 86 SpO2: 97 %   Anthropometric Measurements Height: 5\' 4"  (1.626 m) Weight: 214 lb (97.1 kg) BMI (Calculated): 36.72 Weight at Last Visit: 220 lb Weight Lost Since Last Visit: 6 Weight Gained Since Last Visit: 0 Starting Weight: 258 lb Total Weight Loss (lbs): 44 lb (20 kg) Peak Weight: 281 lb   Body Composition  Body Fat %: 42.6 % Fat Mass (lbs): 91.4 lbs Muscle Mass (lbs): 116.8 lbs Total Body Water  (lbs): 80.2 lbs Visceral Fat Rating : 11   Other Clinical Data Fasting: yes Labs: no Today's Visit #: 37 Starting Date: 09/20/21 Comments: Cat 2     ASSESSMENT AND PLAN:  Diet: Dawn Downs is currently in the action stage of change. As such, her goal is to continue with weight loss efforts and has agreed to the Category 2 Plan.   Exercise:  For substantial health benefits, adults should do at least 150 minutes (2 hours and 30 minutes) a week of moderate-intensity, or 75 minutes (1 hour and 15 minutes) a week of vigorous-intensity aerobic physical activity, or an equivalent combination of moderate- and vigorous-intensity aerobic activity. Aerobic activity should be performed in episodes of at least 10 minutes, and preferably, it should be spread throughout the week.  Behavior Modification:  We discussed the following Behavioral Modification Strategies today: increasing lean protein intake, decreasing simple carbohydrates, increasing vegetables, meal planning and cooking strategies, and keeping healthy foods in the home.   Return in about 3 weeks (around 06/07/2024).   She was informed of the importance of frequent follow up visits to maximize her success with intensive lifestyle modifications for her multiple health conditions.  Attestation Statements:   Reviewed by clinician on day of visit: allergies, medications, problem list, medical history, surgical history, family history, social  history, and previous encounter notes.   Donaciano Frizzle, MD

## 2024-05-17 NOTE — Assessment & Plan Note (Signed)
 Has been more consistent with taking medication since last labs in December.  Will need to repeat labs with Dr. Paulla Bossier in July.  Continue crestor .

## 2024-05-17 NOTE — Assessment & Plan Note (Signed)
 Doing well on combination of metformin  and mounjaro .  No significant GI side effects on combo meds.  Needs refill Mounjaro  today.  Will stay at same dose today.  Labs with PCP in July.

## 2024-05-21 ENCOUNTER — Other Ambulatory Visit (HOSPITAL_BASED_OUTPATIENT_CLINIC_OR_DEPARTMENT_OTHER): Payer: Self-pay

## 2024-06-03 ENCOUNTER — Other Ambulatory Visit (HOSPITAL_BASED_OUTPATIENT_CLINIC_OR_DEPARTMENT_OTHER): Payer: Self-pay

## 2024-06-10 ENCOUNTER — Other Ambulatory Visit (HOSPITAL_BASED_OUTPATIENT_CLINIC_OR_DEPARTMENT_OTHER): Payer: Self-pay

## 2024-06-10 ENCOUNTER — Encounter (INDEPENDENT_AMBULATORY_CARE_PROVIDER_SITE_OTHER): Payer: Self-pay | Admitting: Family Medicine

## 2024-06-10 ENCOUNTER — Ambulatory Visit (INDEPENDENT_AMBULATORY_CARE_PROVIDER_SITE_OTHER): Admitting: Family Medicine

## 2024-06-10 VITALS — BP 125/80 | HR 84 | Temp 98.1°F | Ht 64.0 in | Wt 227.0 lb

## 2024-06-10 DIAGNOSIS — Z6838 Body mass index (BMI) 38.0-38.9, adult: Secondary | ICD-10-CM

## 2024-06-10 DIAGNOSIS — R7989 Other specified abnormal findings of blood chemistry: Secondary | ICD-10-CM

## 2024-06-10 DIAGNOSIS — N200 Calculus of kidney: Secondary | ICD-10-CM

## 2024-06-10 DIAGNOSIS — E1169 Type 2 diabetes mellitus with other specified complication: Secondary | ICD-10-CM | POA: Diagnosis not present

## 2024-06-10 DIAGNOSIS — Z7984 Long term (current) use of oral hypoglycemic drugs: Secondary | ICD-10-CM

## 2024-06-10 DIAGNOSIS — Z7985 Long-term (current) use of injectable non-insulin antidiabetic drugs: Secondary | ICD-10-CM

## 2024-06-10 MED ORDER — TIRZEPATIDE 12.5 MG/0.5ML ~~LOC~~ SOAJ
12.5000 mg | SUBCUTANEOUS | 1 refills | Status: AC
  Filled 2024-06-10 – 2024-06-25 (×2): qty 2, 28d supply, fill #0

## 2024-06-10 NOTE — Progress Notes (Signed)
 Dawn Downs, D.O.  ABFM, ABOM Specializing in Clinical Bariatric Medicine  Office located at: [address] [place], [place]  [address]   Assessment and Plan:  No orders of the defined types were placed in this encounter.  Medications Discontinued During This Encounter  Medication Reason   tirzepatide  (MOUNJARO ) 12.5 MG/0.5ML Pen Reorder     Meds ordered this encounter  Medications   tirzepatide  (MOUNJARO ) 12.5 MG/0.5ML Pen    Sig: Inject 12.5 mg into the skin once a week.    Dispense:  2 mL    Refill:  1    FOR THE DISEASE OF OBESITY: BMI 38.0-38.9,adult -- Current BMI 38.95 Morbidly obese (HCC)-START BMI 44.29 Assessment & Plan: Since last office visit on 05/17/24 patient's muscle mass has increased by 3.4 lbs. Fat mass has increased by 9.2 lbs. Total body water has increased by 3.2 lbs.  Counseling done on how various foods will affect these numbers and how to maximize success  Total lbs lost to date: 31 lbs Total weight loss percentage to date: -12.02%   Recommended Dietary Goals Dawn Downs is currently in the action stage of change. As such, her goal is to continue weight management plan.  She has agreed to: CAT 2 MP with added B/L options   Behavioral Intervention We discussed the following today: increasing lean protein intake to established goals, avoiding skipping meals, increasing water intake , and using GPT or another AI platform for recipe ideas- searching low calorie, low carb, high protein chicken recipes etc  Additional resources provided today: Handout on CAT 1-2 breakfast options, Handout on CAT 1-2 lunch options, and Handout on protein content of various foods  Evidence-based interventions for health behavior change were utilized today including the discussion of self monitoring techniques, problem-solving barriers and SMART goal setting techniques.   Regarding patient's less desirable eating habits and patterns, we employed the technique of  small changes.   Pt will specifically work on: Increased exercise to 3-4 days per week for next visit.    Recommended Physical Activity Goals Dawn Downs has been advised to work up to 300-450 minutes of moderate intensity aerobic activity a week and strengthening exercises 2-3 times per week for cardiovascular health, weight loss maintenance and preservation of muscle mass.   She has agreed to: Increase the intensity, frequency or duration of aerobic exercises  ; see goal above.   Pharmacotherapy We both agreed to: Continue with current nutritional and behavioral strategies.   ASSOCIATED CONDITIONS ADDRESSED TODAY: Type 2 diabetes mellitus with obesity [facility]) Assessment & Plan: Lab Results  Component Value Date   HGBA1C 5.5 04/26/2024   HGBA1C 5.5 12/03/2023   HGBA1C 5.3 06/26/2023   INSULIN  34.0 (H) 04/26/2024   INSULIN  27.9 (H) 09/20/2021    Pt reports inconsistent compliance with Mounjaro  (weekly) and Metformin  (daily), attributing missed doses to distraction from her nephrolithiasis-related pain. She reports off plan eating as a result as well. Encouraged pt to work on medication compliance as prescribed. Will refill Mounjaro  today, no dose changes. Continue with current exercise regimen. Decrease simple carbs/sugars. Increase water intake to half body weight in ounces of water per day.   Orders: - Refill Mounjaro , no dose changes   High serum vitamin D  Assessment & Plan: Lab Results  Component Value Date   VD25OH 126.0 (H) 04/26/2024   VD25OH 89.7 12/03/2023   VD25OH >120 06/26/2023   Within about 2-3 years her vitamin D  has increased from 18.8 on 09/20/21 to 126.0 on 04/26/24.  She has seen a specialist, but pt reports she would be interested in a second opinion. Of note, she does go to the tanning bed about 3 times a week. Reviewed ideal vitamin D  goal of 50-70. Discussed potential risks associated with frequent tanning. Will defer treatment recommendations to appropriate  specialists.    Nephrolithiasis Assessment & Plan: Pt reports intense pain that has distracted her from tasks and prioritizing her weight loss habits. Drinks about 72 ounces of water per day. Encouraged pt to increase her daily water intake to at least half her body weight in ounces of water per day. Advised pt to follow recommendations of her specialist.    Follow up:   Return in about 4 weeks (around 07/08/2024) for Keep upcoming appt, make another appt if pt desires. She was informed of the importance of frequent follow up visits to maximize her success with intensive lifestyle modifications for her multiple health conditions.  Subjective:   Chief complaint: Obesity Dawn Downs is here to discuss her progress with her obesity treatment plan. She is on the Category 2 Plan and states she is following her eating plan approximately 50% of the time. She states she is walking 45 minutes 2 days per week.  Interval History:  Dawn Downs is here for a follow up office visit. Since last OV with on 05/17/24, she has gained 13 lbs. Pt reports she has experienced pain due to nephrolithiasis, causing her to struggle with following her meal plan. She has focused on other areas of her physical health by looking for an endocrinologist to evaluated her parathyroid.   Pharmacotherapy for weight loss: She is currently taking Metformin  500 mg BID, Mounjaro  12.5 mg once weekly and Wellbutrin  XL 450 mg daily.   Review of Systems:  Pertinent positives were addressed with patient today.  Reviewed by clinician on day of visit: allergies, medications, problem list, medical history, surgical history, family history, social history, and previous encounter notes.  Weight Summary and Biometrics   Weight Lost Since Last Visit: 0lb  Weight Gained Since Last Visit: 13lb   Vitals Temp: 98.1 F (36.7 C) BP: 125/80 Pulse Rate: 84 SpO2: 99 %   Anthropometric Measurements Height: 5' 4 (1.626 m) Weight: 227  lb (103 kg) BMI (Calculated): 38.95 Weight at Last Visit: 214lb Weight Lost Since Last Visit: 0lb Weight Gained Since Last Visit: 13lb Starting Weight: 258lb Total Weight Loss (lbs): 33 lb (15 kg) Peak Weight: 281lb   Body Composition  Body Fat %: 44.3 % Fat Mass (lbs): 100.6 lbs Muscle Mass (lbs): 120.2 lbs Total Body Water (lbs): 83.4 lbs Visceral Fat Rating : 12   Other Clinical Data Fasting: No Labs: no Today's Visit #: 38 Starting Date: 09/20/21 Comments: Cat 2    Objective:   PHYSICAL EXAM: Blood pressure 125/80, pulse 84, temperature 98.1 F (36.7 C), height 5' 4 (1.626 m), weight 227 lb (103 kg), SpO2 99%. Body mass index is 38.96 kg/m.  General: she is overweight, cooperative and in no acute distress. PSYCH: Has normal mood, affect and thought process.   HEENT: EOMI, sclerae are anicteric. Lungs: Normal breathing effort, no conversational dyspnea. Extremities: Moves * 4 Neurologic: A and O * 3, good insight  DIAGNOSTIC DATA REVIEWED: BMET    Component Value Date/Time   NA 145 (H) 04/26/2024 0907   K 4.6 04/26/2024 0907   CL 104 04/26/2024 0907   CO2 21 04/26/2024 0907   GLUCOSE 82 04/26/2024 0907   GLUCOSE 101 (H) 06/26/2023 1610  BUN 18 04/26/2024 0907   CREATININE 0.67 04/26/2024 0907   CREATININE 0.53 12/21/2020 1043   CALCIUM  9.7 04/26/2024 0907   GFRNONAA >60 11/10/2009 1045   GFRAA  11/10/2009 1045    >60        The eGFR has been calculated using the MDRD equation. This calculation has not been validated in all clinical situations. eGFR's persistently <60 mL/min signify possible Chronic Kidney Disease.   Lab Results  Component Value Date   HGBA1C 5.5 04/26/2024   HGBA1C  05/31/2009    5.1 (NOTE) The ADA recommends the following therapeutic goal for glycemic control related to Hgb A1c measurement: Goal of therapy: <6.5 Hgb A1c  Reference: American Diabetes Association: Clinical Practice Recommendations 2010, Diabetes Care, 2010,  33: (Suppl  1).   Lab Results  Component Value Date   INSULIN  34.0 (H) 04/26/2024   INSULIN  27.9 (H) 09/20/2021   Lab Results  Component Value Date   TSH 2.16 06/26/2023   CBC    Component Value Date/Time   WBC 9.7 06/26/2023 0806   RBC 5.00 06/26/2023 0806   HGB 13.2 06/26/2023 0806   HCT 40.4 06/26/2023 0806   PLT 313.0 06/26/2023 0806   MCV 80.8 06/26/2023 0806   MCH 26.5 (L) 12/21/2020 1043   MCHC 32.6 06/26/2023 0806   RDW 15.5 06/26/2023 0806   Iron Studies No results found for: IRON, TIBC, FERRITIN, IRONPCTSAT Lipid Panel     Component Value Date/Time   CHOL 201 (H) 12/03/2023 0817   TRIG 50 12/03/2023 0817   HDL 50 12/03/2023 0817   CHOLHDL 4.0 12/03/2023 0817   CHOLHDL 4 06/26/2023 0806   VLDL 10.8 06/26/2023 0806   LDLCALC 142 (H) 12/03/2023 0817   LDLCALC 127 (H) 12/21/2020 1043   Hepatic Function Panel     Component Value Date/Time   PROT 6.5 04/26/2024 0907   ALBUMIN 4.4 04/26/2024 0907   AST 13 04/26/2024 0907   ALT 12 04/26/2024 0907   ALKPHOS 90 04/26/2024 0907   BILITOT 0.3 04/26/2024 0907   BILIDIR 0.10 12/03/2023 0817   IBILI 0.3 12/21/2020 1043      Component Value Date/Time   TSH 2.16 06/26/2023 0806   Nutritional Lab Results  Component Value Date   VD25OH 126.0 (H) 04/26/2024   VD25OH 89.7 12/03/2023   VD25OH >120 06/26/2023    Attestations:   Cherryl Corona, acting as a medical scribe for Marceil Sensor, DO., have compiled all relevant documentation for today's office visit on behalf of Marceil Sensor, DO, while in the presence of [employer], DO.  Reviewed by clinician on day of visit: allergies, medications, problem list, medical history, surgical history, family history, social history, and previous encounter notes pertinent to patient's obesity diagnosis.  I have reviewed the above documentation for accuracy and completeness, and I agree with the above. Dawn Downs, D.O.  The 21st Century Cures Act  was signed into law in 2016 which includes the topic of electronic health records.  This provides immediate access to information in MyChart.  This includes consultation notes, operative notes, office notes, lab results and pathology reports.  If you have any questions about what you read please let us  know at your next visit so we can discuss your concerns and take corrective action if need be.  We are right here with you.

## 2024-06-22 ENCOUNTER — Other Ambulatory Visit (HOSPITAL_COMMUNITY): Payer: Self-pay

## 2024-06-25 ENCOUNTER — Encounter: Payer: Self-pay | Admitting: Family Medicine

## 2024-06-25 ENCOUNTER — Other Ambulatory Visit (HOSPITAL_BASED_OUTPATIENT_CLINIC_OR_DEPARTMENT_OTHER): Payer: Self-pay

## 2024-06-29 ENCOUNTER — Encounter: Payer: No Typology Code available for payment source | Admitting: Family Medicine

## 2024-07-04 ENCOUNTER — Encounter (INDEPENDENT_AMBULATORY_CARE_PROVIDER_SITE_OTHER): Payer: Self-pay | Admitting: Family Medicine

## 2024-07-06 ENCOUNTER — Ambulatory Visit (INDEPENDENT_AMBULATORY_CARE_PROVIDER_SITE_OTHER): Admitting: Family Medicine

## 2024-07-27 ENCOUNTER — Ambulatory Visit (INDEPENDENT_AMBULATORY_CARE_PROVIDER_SITE_OTHER): Admitting: Family Medicine

## 2024-10-13 ENCOUNTER — Encounter (INDEPENDENT_AMBULATORY_CARE_PROVIDER_SITE_OTHER): Payer: Self-pay
